# Patient Record
Sex: Female | Born: 1945 | Race: Black or African American | Hispanic: No | Marital: Single | Smoking: Former smoker
Health system: Southern US, Community
[De-identification: ages and names within clinical notes are randomized; demographics above are authoritative.]

## PROBLEM LIST (undated history)

## (undated) DIAGNOSIS — M1990 Unspecified osteoarthritis, unspecified site: Secondary | ICD-10-CM

## (undated) DIAGNOSIS — E119 Type 2 diabetes mellitus without complications: Secondary | ICD-10-CM

## (undated) DIAGNOSIS — J309 Allergic rhinitis, unspecified: Secondary | ICD-10-CM

## (undated) DIAGNOSIS — I10 Essential (primary) hypertension: Secondary | ICD-10-CM

## (undated) DIAGNOSIS — F32A Depression, unspecified: Secondary | ICD-10-CM

## (undated) DIAGNOSIS — E7800 Pure hypercholesterolemia, unspecified: Secondary | ICD-10-CM

## (undated) DIAGNOSIS — I509 Heart failure, unspecified: Secondary | ICD-10-CM

## (undated) DIAGNOSIS — D490 Neoplasm of unspecified behavior of digestive system: Secondary | ICD-10-CM

## (undated) DIAGNOSIS — M5417 Radiculopathy, lumbosacral region: Secondary | ICD-10-CM

## (undated) DIAGNOSIS — F329 Major depressive disorder, single episode, unspecified: Secondary | ICD-10-CM

## (undated) DIAGNOSIS — C801 Malignant (primary) neoplasm, unspecified: Secondary | ICD-10-CM

## (undated) HISTORY — DX: Neoplasm of unspecified behavior of digestive system: D49.0

## (undated) HISTORY — PX: TUBAL LIGATION: SHX77

## (undated) HISTORY — DX: Pure hypercholesterolemia, unspecified: E78.00

## (undated) HISTORY — DX: Major depressive disorder, single episode, unspecified: F32.9

## (undated) HISTORY — PX: CHOLECYSTECTOMY: SHX55

## (undated) HISTORY — DX: Type 2 diabetes mellitus without complications: E11.9

## (undated) HISTORY — DX: Unspecified osteoarthritis, unspecified site: M19.90

## (undated) HISTORY — DX: Radiculopathy, lumbosacral region: M54.17

## (undated) HISTORY — DX: Depression, unspecified: F32.A

## (undated) HISTORY — PX: REPLACEMENT TOTAL KNEE BILATERAL: SUR1225

## (undated) HISTORY — DX: Allergic rhinitis, unspecified: J30.9

## (undated) HISTORY — DX: Malignant (primary) neoplasm, unspecified: C80.1

---

## 2003-02-04 HISTORY — PX: BARIATRIC SURGERY: SHX1103

## 2004-09-21 ENCOUNTER — Ambulatory Visit: Payer: Self-pay | Admitting: Family Medicine

## 2006-03-07 ENCOUNTER — Ambulatory Visit: Payer: Self-pay | Admitting: Family Medicine

## 2006-04-17 ENCOUNTER — Ambulatory Visit: Payer: Self-pay | Admitting: Gastroenterology

## 2007-12-13 ENCOUNTER — Emergency Department: Payer: Self-pay | Admitting: Emergency Medicine

## 2008-06-06 ENCOUNTER — Ambulatory Visit: Payer: Self-pay | Admitting: Family Medicine

## 2008-11-06 ENCOUNTER — Ambulatory Visit: Payer: Self-pay | Admitting: Family Medicine

## 2009-06-01 ENCOUNTER — Ambulatory Visit: Payer: Self-pay | Admitting: General Practice

## 2009-06-24 ENCOUNTER — Inpatient Hospital Stay: Payer: Self-pay | Admitting: General Practice

## 2009-06-30 ENCOUNTER — Encounter: Payer: Self-pay | Admitting: Internal Medicine

## 2009-07-06 ENCOUNTER — Encounter: Payer: Self-pay | Admitting: Internal Medicine

## 2009-11-09 DIAGNOSIS — F329 Major depressive disorder, single episode, unspecified: Secondary | ICD-10-CM | POA: Insufficient documentation

## 2009-11-13 ENCOUNTER — Ambulatory Visit: Payer: Self-pay | Admitting: Family Medicine

## 2009-12-22 ENCOUNTER — Ambulatory Visit: Payer: Self-pay | Admitting: General Practice

## 2010-01-06 ENCOUNTER — Inpatient Hospital Stay: Payer: Self-pay | Admitting: General Practice

## 2010-05-21 ENCOUNTER — Ambulatory Visit: Payer: Self-pay | Admitting: Family Medicine

## 2011-06-07 ENCOUNTER — Ambulatory Visit: Payer: Self-pay

## 2011-06-09 LAB — HM MAMMOGRAPHY: HM Mammogram: NORMAL

## 2011-06-09 LAB — HM PAP SMEAR: HM Pap smear: NORMAL

## 2012-06-07 ENCOUNTER — Ambulatory Visit: Payer: Self-pay | Admitting: Family Medicine

## 2012-10-01 ENCOUNTER — Ambulatory Visit: Payer: Self-pay | Admitting: Family Medicine

## 2013-07-16 ENCOUNTER — Ambulatory Visit: Payer: Self-pay | Admitting: Family Medicine

## 2013-10-03 ENCOUNTER — Ambulatory Visit: Payer: Self-pay | Admitting: Family Medicine

## 2013-10-06 ENCOUNTER — Ambulatory Visit: Payer: Self-pay | Admitting: Family Medicine

## 2013-12-16 ENCOUNTER — Ambulatory Visit: Payer: Self-pay | Admitting: Gastroenterology

## 2014-01-06 LAB — HM COLONOSCOPY

## 2014-01-09 DIAGNOSIS — J302 Other seasonal allergic rhinitis: Secondary | ICD-10-CM | POA: Insufficient documentation

## 2014-08-27 ENCOUNTER — Emergency Department: Payer: Self-pay | Admitting: Emergency Medicine

## 2014-09-09 DIAGNOSIS — I878 Other specified disorders of veins: Secondary | ICD-10-CM | POA: Diagnosis not present

## 2014-09-09 DIAGNOSIS — M25511 Pain in right shoulder: Secondary | ICD-10-CM | POA: Diagnosis not present

## 2014-09-09 DIAGNOSIS — G8929 Other chronic pain: Secondary | ICD-10-CM | POA: Diagnosis not present

## 2014-09-09 DIAGNOSIS — M5090 Cervical disc disorder, unspecified, unspecified cervical region: Secondary | ICD-10-CM | POA: Diagnosis not present

## 2014-09-18 DIAGNOSIS — M65811 Other synovitis and tenosynovitis, right shoulder: Secondary | ICD-10-CM | POA: Insufficient documentation

## 2014-09-18 DIAGNOSIS — M7541 Impingement syndrome of right shoulder: Secondary | ICD-10-CM | POA: Insufficient documentation

## 2014-09-18 DIAGNOSIS — M65919 Unspecified synovitis and tenosynovitis, unspecified shoulder: Secondary | ICD-10-CM | POA: Insufficient documentation

## 2014-09-18 DIAGNOSIS — M65819 Other synovitis and tenosynovitis, unspecified shoulder: Secondary | ICD-10-CM | POA: Insufficient documentation

## 2014-09-30 DIAGNOSIS — H6693 Otitis media, unspecified, bilateral: Secondary | ICD-10-CM | POA: Diagnosis not present

## 2014-09-30 DIAGNOSIS — R062 Wheezing: Secondary | ICD-10-CM | POA: Diagnosis not present

## 2014-09-30 DIAGNOSIS — B9689 Other specified bacterial agents as the cause of diseases classified elsewhere: Secondary | ICD-10-CM | POA: Diagnosis not present

## 2014-09-30 DIAGNOSIS — J019 Acute sinusitis, unspecified: Secondary | ICD-10-CM | POA: Diagnosis not present

## 2014-10-06 DIAGNOSIS — G629 Polyneuropathy, unspecified: Secondary | ICD-10-CM | POA: Diagnosis not present

## 2014-10-06 DIAGNOSIS — E669 Obesity, unspecified: Secondary | ICD-10-CM | POA: Diagnosis not present

## 2014-10-06 DIAGNOSIS — E119 Type 2 diabetes mellitus without complications: Secondary | ICD-10-CM | POA: Diagnosis not present

## 2014-10-06 DIAGNOSIS — I10 Essential (primary) hypertension: Secondary | ICD-10-CM | POA: Diagnosis not present

## 2014-10-06 DIAGNOSIS — E785 Hyperlipidemia, unspecified: Secondary | ICD-10-CM | POA: Diagnosis not present

## 2014-10-14 DIAGNOSIS — I10 Essential (primary) hypertension: Secondary | ICD-10-CM | POA: Diagnosis not present

## 2014-10-14 DIAGNOSIS — E119 Type 2 diabetes mellitus without complications: Secondary | ICD-10-CM | POA: Diagnosis not present

## 2014-10-20 DIAGNOSIS — E119 Type 2 diabetes mellitus without complications: Secondary | ICD-10-CM | POA: Diagnosis not present

## 2014-10-20 DIAGNOSIS — E669 Obesity, unspecified: Secondary | ICD-10-CM | POA: Diagnosis not present

## 2014-10-20 DIAGNOSIS — I10 Essential (primary) hypertension: Secondary | ICD-10-CM | POA: Diagnosis not present

## 2014-10-20 DIAGNOSIS — E785 Hyperlipidemia, unspecified: Secondary | ICD-10-CM | POA: Diagnosis not present

## 2014-10-20 DIAGNOSIS — G629 Polyneuropathy, unspecified: Secondary | ICD-10-CM | POA: Diagnosis not present

## 2014-10-22 DIAGNOSIS — F329 Major depressive disorder, single episode, unspecified: Secondary | ICD-10-CM | POA: Diagnosis not present

## 2014-11-06 ENCOUNTER — Ambulatory Visit: Payer: Self-pay | Admitting: Family Medicine

## 2014-11-06 DIAGNOSIS — Z780 Asymptomatic menopausal state: Secondary | ICD-10-CM | POA: Diagnosis not present

## 2014-11-06 DIAGNOSIS — Z803 Family history of malignant neoplasm of breast: Secondary | ICD-10-CM | POA: Diagnosis not present

## 2014-11-06 DIAGNOSIS — N649 Disorder of breast, unspecified: Secondary | ICD-10-CM | POA: Diagnosis not present

## 2014-11-06 DIAGNOSIS — N644 Mastodynia: Secondary | ICD-10-CM | POA: Diagnosis not present

## 2014-11-06 DIAGNOSIS — Z13820 Encounter for screening for osteoporosis: Secondary | ICD-10-CM | POA: Diagnosis not present

## 2014-11-12 DIAGNOSIS — M7541 Impingement syndrome of right shoulder: Secondary | ICD-10-CM | POA: Diagnosis not present

## 2014-11-12 DIAGNOSIS — M65811 Other synovitis and tenosynovitis, right shoulder: Secondary | ICD-10-CM | POA: Diagnosis not present

## 2014-12-08 DIAGNOSIS — E669 Obesity, unspecified: Secondary | ICD-10-CM | POA: Insufficient documentation

## 2014-12-08 DIAGNOSIS — I10 Essential (primary) hypertension: Secondary | ICD-10-CM | POA: Insufficient documentation

## 2014-12-08 DIAGNOSIS — E785 Hyperlipidemia, unspecified: Secondary | ICD-10-CM | POA: Insufficient documentation

## 2014-12-08 DIAGNOSIS — E1140 Type 2 diabetes mellitus with diabetic neuropathy, unspecified: Secondary | ICD-10-CM | POA: Insufficient documentation

## 2015-01-30 DIAGNOSIS — H5203 Hypermetropia, bilateral: Secondary | ICD-10-CM | POA: Diagnosis not present

## 2015-01-30 DIAGNOSIS — H43813 Vitreous degeneration, bilateral: Secondary | ICD-10-CM | POA: Diagnosis not present

## 2015-01-30 DIAGNOSIS — H11442 Conjunctival cysts, left eye: Secondary | ICD-10-CM | POA: Diagnosis not present

## 2015-01-30 DIAGNOSIS — H25813 Combined forms of age-related cataract, bilateral: Secondary | ICD-10-CM | POA: Diagnosis not present

## 2015-01-30 DIAGNOSIS — H52223 Regular astigmatism, bilateral: Secondary | ICD-10-CM | POA: Diagnosis not present

## 2015-01-30 DIAGNOSIS — I10 Essential (primary) hypertension: Secondary | ICD-10-CM | POA: Diagnosis not present

## 2015-02-05 ENCOUNTER — Encounter: Payer: Self-pay | Admitting: Family Medicine

## 2015-02-05 ENCOUNTER — Ambulatory Visit (INDEPENDENT_AMBULATORY_CARE_PROVIDER_SITE_OTHER): Payer: Commercial Managed Care - HMO | Admitting: Family Medicine

## 2015-02-05 VITALS — BP 122/72 | HR 91 | Temp 97.6°F | Resp 16 | Ht 67.0 in | Wt 263.2 lb

## 2015-02-05 DIAGNOSIS — E668 Other obesity: Secondary | ICD-10-CM | POA: Insufficient documentation

## 2015-02-05 DIAGNOSIS — R946 Abnormal results of thyroid function studies: Secondary | ICD-10-CM | POA: Insufficient documentation

## 2015-02-05 DIAGNOSIS — E1169 Type 2 diabetes mellitus with other specified complication: Secondary | ICD-10-CM

## 2015-02-05 DIAGNOSIS — I10 Essential (primary) hypertension: Secondary | ICD-10-CM

## 2015-02-05 DIAGNOSIS — M5090 Cervical disc disorder, unspecified, unspecified cervical region: Secondary | ICD-10-CM | POA: Insufficient documentation

## 2015-02-05 DIAGNOSIS — G629 Polyneuropathy, unspecified: Secondary | ICD-10-CM | POA: Insufficient documentation

## 2015-02-05 DIAGNOSIS — M5136 Other intervertebral disc degeneration, lumbar region: Secondary | ICD-10-CM | POA: Insufficient documentation

## 2015-02-05 DIAGNOSIS — E669 Obesity, unspecified: Secondary | ICD-10-CM

## 2015-02-05 DIAGNOSIS — E785 Hyperlipidemia, unspecified: Secondary | ICD-10-CM | POA: Diagnosis not present

## 2015-02-05 DIAGNOSIS — Z1239 Encounter for other screening for malignant neoplasm of breast: Secondary | ICD-10-CM | POA: Insufficient documentation

## 2015-02-05 DIAGNOSIS — M51369 Other intervertebral disc degeneration, lumbar region without mention of lumbar back pain or lower extremity pain: Secondary | ICD-10-CM | POA: Insufficient documentation

## 2015-02-05 DIAGNOSIS — I878 Other specified disorders of veins: Secondary | ICD-10-CM | POA: Insufficient documentation

## 2015-02-05 DIAGNOSIS — R42 Dizziness and giddiness: Secondary | ICD-10-CM | POA: Insufficient documentation

## 2015-02-05 DIAGNOSIS — E1149 Type 2 diabetes mellitus with other diabetic neurological complication: Secondary | ICD-10-CM | POA: Diagnosis not present

## 2015-02-05 DIAGNOSIS — M25519 Pain in unspecified shoulder: Secondary | ICD-10-CM | POA: Insufficient documentation

## 2015-02-05 LAB — POCT GLYCOSYLATED HEMOGLOBIN (HGB A1C): HEMOGLOBIN A1C: 7.6

## 2015-02-05 LAB — GLUCOSE, POCT (MANUAL RESULT ENTRY): POC Glucose: 141 mg/dl — AB (ref 70–99)

## 2015-02-05 MED ORDER — MELOXICAM 15 MG PO TABS: 15.0000 mg | ORAL_TABLET | Freq: Every day | ORAL | Status: DC

## 2015-02-05 MED ORDER — ATORVASTATIN CALCIUM 20 MG PO TABS: 20.0000 mg | ORAL_TABLET | Freq: Every day | ORAL | Status: DC

## 2015-02-05 MED ORDER — LISINOPRIL-HYDROCHLOROTHIAZIDE 20-12.5 MG PO TABS: 1.0000 | ORAL_TABLET | Freq: Every day | ORAL | Status: DC

## 2015-02-05 NOTE — Progress Notes (Signed)
Subjective:     Patient ID: Mackenzie Park, female   DOB: 09/02/1946, 69 y.o.   MRN: [id number]  Diabetes She has type 2 diabetes mellitus. Pertinent negatives for hypoglycemia include no nervousness/anxiousness. Associated symptoms include fatigue. Pertinent negatives for diabetes include no blurred vision and no chest pain. Pertinent negatives for diabetic complications include no autonomic neuropathy, heart disease or PVD. Current diabetic treatment includes oral agent (dual therapy). Her home blood glucose trend is increasing steadily.  Hypertension This is a chronic problem. The current episode started more than 1 year ago. The problem is unchanged. The problem is controlled. Associated symptoms include anxiety and peripheral edema. Pertinent negatives include no blurred vision or chest pain. Past treatments include ACE inhibitors, diuretics and lifestyle changes. The current treatment provides moderate improvement. There are no compliance problems.  There is no history of PVD.  Hyperlipidemia This is a chronic problem. The current episode started more than 1 year ago. The problem is controlled. Recent lipid tests were reviewed and are normal. Exacerbating diseases include diabetes and obesity. Factors aggravating her hyperlipidemia include fatty foods. Associated symptoms include myalgias. Pertinent negatives include no chest pain. Current antihyperlipidemic treatment includes statins. The current treatment provides moderate improvement of lipids. There are no compliance problems.  Risk factors for coronary artery disease include diabetes mellitus, dyslipidemia, hypertension, obesity, stress and a sedentary lifestyle.  Patient had a eye exam on May 26,2016.   Obesity Continues to struggle with exercise 2nd to arthritic pain.  ? Dietary compliance  Review of Systems  Constitutional: Positive for fatigue. Negative for fever.  Eyes: Negative for blurred vision and pain.  Respiratory: Positive for  cough ( yellow mucus). Negative for chest tightness.   Cardiovascular: Positive for leg swelling (leg pain). Negative for chest pain.  Musculoskeletal: Positive for myalgias, back pain, arthralgias and gait problem.  Skin: Negative for color change and rash.  Neurological: Positive for numbness.  Hematological: Positive for adenopathy.  Psychiatric/Behavioral: Negative for sleep disturbance and dysphoric mood. The patient is not nervous/anxious.        Objective:   Physical Exam  Constitutional: She is oriented to person, place, and time. She appears well-developed and well-nourished.  HENT:  Head: Normocephalic and atraumatic.  Eyes: Conjunctivae are normal. Pupils are equal, round, and reactive to light. No scleral icterus.  Left eye cyst  Neck: Neck supple. No thyromegaly present.  Cardiovascular: Normal rate, regular rhythm, normal heart sounds and intact distal pulses.   Pulses:      Dorsalis pedis pulses are 2+ on the right side, and 2+ on the left side.       Posterior tibial pulses are 2+ on the right side, and 2+ on the left side.  VV  Pulmonary/Chest: Effort normal and breath sounds normal.  Musculoskeletal: She exhibits edema.  Lymphadenopathy:    She has no cervical adenopathy.  Neurological: She is alert and oriented to person, place, and time. A sensory deficit is present.  Skin: Skin is warm and dry.       Assessment and Plan:  1. Essential hypertension stable - lisinopril-hydrochlorothiazide (PRINZIDE,ZESTORETIC) 20-12.5 MG per tablet; Take 1 tablet by mouth daily.  Dispense: 30 tablet; Refill: 3  2. DM (diabetes mellitus) type II controlled, neurological manifestation  - POCT HgB A1C - POCT Glucose (CBG)  3. Hyperlipidemia associated with type 2 diabetes mellitus  - atorvastatin (LIPITOR) 20 MG tablet; Take 1 tablet (20 mg total) by mouth daily.  Dispense: 30 tablet; Refill: 3  4. Obesity No sig improvement

## 2015-02-05 NOTE — Patient Instructions (Signed)
Patient to return 4 months

## 2015-02-06 ENCOUNTER — Encounter: Payer: Self-pay | Admitting: Family Medicine

## 2015-05-01 ENCOUNTER — Other Ambulatory Visit: Payer: Self-pay | Admitting: Family Medicine

## 2015-05-01 DIAGNOSIS — I10 Essential (primary) hypertension: Secondary | ICD-10-CM | POA: Diagnosis not present

## 2015-05-01 DIAGNOSIS — E119 Type 2 diabetes mellitus without complications: Secondary | ICD-10-CM | POA: Diagnosis not present

## 2015-05-01 DIAGNOSIS — H2513 Age-related nuclear cataract, bilateral: Secondary | ICD-10-CM | POA: Diagnosis not present

## 2015-05-11 ENCOUNTER — Observation Stay
Admission: EM | Admit: 2015-05-11 | Discharge: 2015-05-12 | Disposition: A | Discharge status: Home / Self Care | Payer: Commercial Managed Care - HMO | Attending: Internal Medicine | Admitting: Internal Medicine

## 2015-05-11 ENCOUNTER — Emergency Department: Payer: Commercial Managed Care - HMO

## 2015-05-11 ENCOUNTER — Other Ambulatory Visit: Payer: Self-pay

## 2015-05-11 ENCOUNTER — Encounter: Payer: Self-pay | Admitting: Emergency Medicine

## 2015-05-11 DIAGNOSIS — F329 Major depressive disorder, single episode, unspecified: Secondary | ICD-10-CM | POA: Insufficient documentation

## 2015-05-11 DIAGNOSIS — J309 Allergic rhinitis, unspecified: Secondary | ICD-10-CM | POA: Insufficient documentation

## 2015-05-11 DIAGNOSIS — Z7982 Long term (current) use of aspirin: Secondary | ICD-10-CM | POA: Insufficient documentation

## 2015-05-11 DIAGNOSIS — R202 Paresthesia of skin: Secondary | ICD-10-CM | POA: Insufficient documentation

## 2015-05-11 DIAGNOSIS — Z79899 Other long term (current) drug therapy: Secondary | ICD-10-CM | POA: Insufficient documentation

## 2015-05-11 DIAGNOSIS — E785 Hyperlipidemia, unspecified: Secondary | ICD-10-CM | POA: Insufficient documentation

## 2015-05-11 DIAGNOSIS — R51 Headache: Secondary | ICD-10-CM | POA: Diagnosis not present

## 2015-05-11 DIAGNOSIS — E876 Hypokalemia: Secondary | ICD-10-CM | POA: Diagnosis not present

## 2015-05-11 DIAGNOSIS — Z96653 Presence of artificial knee joint, bilateral: Secondary | ICD-10-CM | POA: Insufficient documentation

## 2015-05-11 DIAGNOSIS — M5417 Radiculopathy, lumbosacral region: Secondary | ICD-10-CM | POA: Insufficient documentation

## 2015-05-11 DIAGNOSIS — E119 Type 2 diabetes mellitus without complications: Secondary | ICD-10-CM | POA: Insufficient documentation

## 2015-05-11 DIAGNOSIS — E041 Nontoxic single thyroid nodule: Secondary | ICD-10-CM | POA: Diagnosis not present

## 2015-05-11 DIAGNOSIS — R270 Ataxia, unspecified: Principal | ICD-10-CM

## 2015-05-11 DIAGNOSIS — I639 Cerebral infarction, unspecified: Secondary | ICD-10-CM

## 2015-05-11 DIAGNOSIS — Z791 Long term (current) use of non-steroidal anti-inflammatories (NSAID): Secondary | ICD-10-CM | POA: Insufficient documentation

## 2015-05-11 DIAGNOSIS — M1990 Unspecified osteoarthritis, unspecified site: Secondary | ICD-10-CM | POA: Insufficient documentation

## 2015-05-11 DIAGNOSIS — Z87891 Personal history of nicotine dependence: Secondary | ICD-10-CM | POA: Insufficient documentation

## 2015-05-11 DIAGNOSIS — R0789 Other chest pain: Secondary | ICD-10-CM | POA: Diagnosis not present

## 2015-05-11 DIAGNOSIS — I700 Atherosclerosis of aorta: Secondary | ICD-10-CM | POA: Insufficient documentation

## 2015-05-11 DIAGNOSIS — I10 Essential (primary) hypertension: Secondary | ICD-10-CM | POA: Insufficient documentation

## 2015-05-11 DIAGNOSIS — R42 Dizziness and giddiness: Secondary | ICD-10-CM | POA: Diagnosis not present

## 2015-05-11 LAB — LIPID PANEL
Cholesterol: 137 mg/dL (ref 0–200)
HDL: 49 mg/dL (ref >40)
LDL CALC: 80 mg/dL (ref 0–99)
Total CHOL/HDL Ratio: 2.8 RATIO
Triglycerides: 39 mg/dL (ref <150)
VLDL: 8 mg/dL (ref 0–40)

## 2015-05-11 LAB — BASIC METABOLIC PANEL
ANION GAP: 8 (ref 5–15)
BUN: 14 mg/dL (ref 6–20)
CO2: 29 mmol/L (ref 22–32)
Calcium: 9.3 mg/dL (ref 8.9–10.3)
Chloride: 102 mmol/L (ref 101–111)
Creatinine, Ser: 0.74 mg/dL (ref 0.44–1.00)
GFR calc Af Amer: >60 mL/min (ref >60)
Glucose, Bld: 175 mg/dL — ABNORMAL HIGH (ref 65–99)
POTASSIUM: 3.3 mmol/L — AB (ref 3.5–5.1)
SODIUM: 139 mmol/L (ref 135–145)

## 2015-05-11 LAB — URINALYSIS COMPLETE WITH MICROSCOPIC (ARMC ONLY)
BILIRUBIN URINE: NEGATIVE
Glucose, UA: NEGATIVE mg/dL
HGB URINE DIPSTICK: NEGATIVE
KETONES UR: NEGATIVE mg/dL
LEUKOCYTES UA: NEGATIVE
NITRITE: NEGATIVE
PH: 5 (ref 5.0–8.0)
Protein, ur: NEGATIVE mg/dL
RBC / HPF: NONE SEEN RBC/hpf (ref 0–5)
SQUAMOUS EPITHELIAL / LPF: NONE SEEN
Specific Gravity, Urine: 1.009 (ref 1.005–1.030)

## 2015-05-11 LAB — CBC WITH DIFFERENTIAL/PLATELET
BASOS ABS: 0 10*3/uL (ref 0–0.1)
Basophils Relative: 0 %
EOS ABS: 0.1 10*3/uL (ref 0–0.7)
EOS PCT: 2 %
HCT: 38.7 % (ref 35.0–47.0)
Hemoglobin: 12.5 g/dL (ref 12.0–16.0)
LYMPHS PCT: 16 %
Lymphs Abs: 1.2 10*3/uL (ref 1.0–3.6)
MCH: 26.5 pg (ref 26.0–34.0)
MCHC: 32.2 g/dL (ref 32.0–36.0)
MCV: 82.3 fL (ref 80.0–100.0)
Monocytes Absolute: 0.5 10*3/uL (ref 0.2–0.9)
Monocytes Relative: 6 %
Neutro Abs: 6 10*3/uL (ref 1.4–6.5)
Neutrophils Relative %: 76 %
PLATELETS: 226 10*3/uL (ref 150–440)
RBC: 4.7 MIL/uL (ref 3.80–5.20)
RDW: 14.1 % (ref 11.5–14.5)
WBC: 7.8 10*3/uL (ref 3.6–11.0)

## 2015-05-11 LAB — GLUCOSE, CAPILLARY
Glucose-Capillary: 215 mg/dL — ABNORMAL HIGH (ref 65–99)
Glucose-Capillary: 85 mg/dL (ref 65–99)

## 2015-05-11 LAB — TROPONIN I

## 2015-05-11 MED ORDER — GABAPENTIN 300 MG PO CAPS
300.0000 mg | ORAL_CAPSULE | Freq: Three times a day (TID) | ORAL | Status: DC
  Administered 2015-05-11 – 2015-05-12 (×3): 300 mg via ORAL
  Filled 2015-05-11 (×3): qty 1

## 2015-05-11 MED ORDER — HYDROCHLOROTHIAZIDE 12.5 MG PO CAPS: 12.5000 mg | ORAL_CAPSULE | Freq: Every day | ORAL | Status: DC

## 2015-05-11 MED ORDER — MECLIZINE HCL 12.5 MG PO TABS
25.0000 mg | ORAL_TABLET | Freq: Three times a day (TID) | ORAL | Status: DC
  Administered 2015-05-11 – 2015-05-12 (×3): 25 mg via ORAL

## 2015-05-11 MED ORDER — SENNOSIDES-DOCUSATE SODIUM 8.6-50 MG PO TABS
1.0000 | ORAL_TABLET | Freq: Every evening | ORAL | Status: DC | PRN
Start: 2015-05-11 — End: 2015-05-12

## 2015-05-11 MED ORDER — LISINOPRIL-HYDROCHLOROTHIAZIDE 20-12.5 MG PO TABS: 1.0000 | ORAL_TABLET | Freq: Every day | ORAL | Status: DC

## 2015-05-11 MED ORDER — HYDROCODONE-ACETAMINOPHEN 5-325 MG PO TABS: 1.0000 | ORAL_TABLET | ORAL | Status: DC | PRN

## 2015-05-11 MED ORDER — LISINOPRIL 20 MG PO TABS: 20.0000 mg | ORAL_TABLET | Freq: Every day | ORAL | Status: DC

## 2015-05-11 MED ORDER — CLOPIDOGREL BISULFATE 75 MG PO TABS
75.0000 mg | ORAL_TABLET | Freq: Every day | ORAL | Status: DC
  Administered 2015-05-12: 12:00:00 75 mg via ORAL
  Filled 2015-05-11: qty 1

## 2015-05-11 MED ORDER — DOCUSATE SODIUM 100 MG PO CAPS
100.0000 mg | ORAL_CAPSULE | Freq: Two times a day (BID) | ORAL | Status: DC
  Administered 2015-05-11 (×2): 100 mg via ORAL
  Filled 2015-05-11 (×2): qty 1

## 2015-05-11 MED ORDER — ASPIRIN EC 81 MG PO TBEC
81.0000 mg | DELAYED_RELEASE_TABLET | Freq: Every day | ORAL | Status: DC
  Administered 2015-05-12: 12:00:00 81 mg via ORAL
  Filled 2015-05-11: qty 1

## 2015-05-11 MED ORDER — ACETAMINOPHEN 325 MG PO TABS
650.0000 mg | ORAL_TABLET | Freq: Four times a day (QID) | ORAL | Status: DC | PRN
  Administered 2015-05-12: 650 mg via ORAL
  Filled 2015-05-11: qty 2

## 2015-05-11 MED ORDER — ONDANSETRON HCL 4 MG/2ML IJ SOLN
4.0000 mg | Freq: Once | INTRAMUSCULAR | Status: AC
  Administered 2015-05-11: 4 mg via INTRAVENOUS
  Filled 2015-05-11: qty 2

## 2015-05-11 MED ORDER — ATORVASTATIN CALCIUM 20 MG PO TABS
20.0000 mg | ORAL_TABLET | Freq: Every day | ORAL | Status: DC
  Administered 2015-05-11: 20 mg via ORAL
  Filled 2015-05-11: qty 1

## 2015-05-11 MED ORDER — ONDANSETRON HCL 4 MG PO TABS: 4.0000 mg | ORAL_TABLET | Freq: Four times a day (QID) | ORAL | Status: DC | PRN

## 2015-05-11 MED ORDER — ONDANSETRON HCL 4 MG/2ML IJ SOLN: 4.0000 mg | Freq: Four times a day (QID) | INTRAMUSCULAR | Status: DC | PRN

## 2015-05-11 MED ORDER — ALUM & MAG HYDROXIDE-SIMETH 200-200-20 MG/5ML PO SUSP: 30.0000 mL | Freq: Four times a day (QID) | ORAL | Status: DC | PRN

## 2015-05-11 MED ORDER — ASPIRIN 81 MG PO TABS: 81.0000 mg | ORAL_TABLET | Freq: Every day | ORAL | Status: DC

## 2015-05-11 MED ORDER — MECLIZINE HCL 25 MG PO TABS
25.0000 mg | ORAL_TABLET | Freq: Once | ORAL | Status: AC
  Administered 2015-05-11: 25 mg via ORAL
  Filled 2015-05-11: qty 1

## 2015-05-11 MED ORDER — ATORVASTATIN CALCIUM 20 MG PO TABS: 20.0000 mg | ORAL_TABLET | Freq: Every day | ORAL | Status: DC

## 2015-05-11 MED ORDER — SODIUM CHLORIDE 0.9 % IJ SOLN
3.0000 mL | Freq: Two times a day (BID) | INTRAMUSCULAR | Status: DC
  Administered 2015-05-11: 3 mL via INTRAVENOUS

## 2015-05-11 MED ORDER — INSULIN ASPART 100 UNIT/ML ~~LOC~~ SOLN
0.0000 [IU] | Freq: Three times a day (TID) | SUBCUTANEOUS | Status: DC
  Administered 2015-05-11: 17:00:00 3 [IU] via SUBCUTANEOUS

## 2015-05-11 MED ORDER — SODIUM CHLORIDE 0.9 % IV SOLN
INTRAVENOUS | Status: DC
  Administered 2015-05-11: 16:00:00 via INTRAVENOUS

## 2015-05-11 MED ORDER — BISACODYL 5 MG PO TBEC: 5.0000 mg | DELAYED_RELEASE_TABLET | Freq: Every day | ORAL | Status: DC | PRN

## 2015-05-11 MED ORDER — POTASSIUM CHLORIDE 20 MEQ/15ML (10%) PO SOLN
40.0000 meq | Freq: Once | ORAL | Status: AC
  Administered 2015-05-11: 40 meq via ORAL
  Filled 2015-05-11 (×2): qty 30

## 2015-05-11 MED ORDER — MECLIZINE HCL 12.5 MG PO TABS
25.0000 mg | ORAL_TABLET | Freq: Three times a day (TID) | ORAL | Status: DC | PRN
  Filled 2015-05-11 (×3): qty 2

## 2015-05-11 MED ORDER — ALBUTEROL SULFATE (2.5 MG/3ML) 0.083% IN NEBU: 3.0000 mL | INHALATION_SOLUTION | Freq: Four times a day (QID) | RESPIRATORY_TRACT | Status: DC | PRN

## 2015-05-11 MED ORDER — ENOXAPARIN SODIUM 40 MG/0.4ML ~~LOC~~ SOLN
40.0000 mg | Freq: Two times a day (BID) | SUBCUTANEOUS | Status: DC
  Administered 2015-05-11 – 2015-05-12 (×2): 40 mg via SUBCUTANEOUS
  Filled 2015-05-11 (×2): qty 0.4

## 2015-05-11 MED ORDER — ASPIRIN 81 MG PO CHEW
324.0000 mg | CHEWABLE_TABLET | Freq: Once | ORAL | Status: AC
  Administered 2015-05-11: 243 mg via ORAL
  Filled 2015-05-11: qty 3

## 2015-05-11 MED ORDER — ACETAMINOPHEN 650 MG RE SUPP: 650.0000 mg | Freq: Four times a day (QID) | RECTAL | Status: DC | PRN

## 2015-05-11 MED ORDER — ENOXAPARIN SODIUM 40 MG/0.4ML ~~LOC~~ SOLN: 40.0000 mg | SUBCUTANEOUS | Status: DC

## 2015-05-11 MED ORDER — INSULIN ASPART 100 UNIT/ML ~~LOC~~ SOLN: 0.0000 [IU] | Freq: Every day | SUBCUTANEOUS | Status: DC

## 2015-05-11 MED ORDER — MELOXICAM 7.5 MG PO TABS
15.0000 mg | ORAL_TABLET | Freq: Every day | ORAL | Status: DC
  Administered 2015-05-12: 15 mg via ORAL
  Filled 2015-05-11: qty 2

## 2015-05-11 MED ORDER — LORATADINE 10 MG PO TABS
10.0000 mg | ORAL_TABLET | Freq: Every day | ORAL | Status: DC
  Administered 2015-05-12: 10 mg via ORAL
  Filled 2015-05-11: qty 1

## 2015-05-11 NOTE — Progress Notes (Signed)
Inpatient Diabetes Program Recommendations  AACE/ADA: New Consensus Statement on Inpatient Glycemic Control (2013)  Target Ranges:  Prepandial:   less than 140 mg/dL      Peak postprandial:   less than 180 mg/dL (1-2 hours)      Critically ill patients:  140 - 180 mg/dL   Reason for Glycemic Review: Consult/DM  Diabetes history: DM 2 Outpatient Diabetes medications: None diet controlled Current orders for Inpatient glycemic control: Novolog Sensitive + Novolog Moderate scale  Note: Patient sees Dr. Rutherford Nail for DM control. Patient saw dr. Rutherford Nail on 02/05/2015 and at that time her A1c was 7.6%. Patient was not on any medication at that time as well. Will await A1c results. Will follow patient while here.  Thanks,  [name] RN, MSN, [facility] Inpatient Diabetes Coordinator Team Pager [phone]

## 2015-05-11 NOTE — ED Notes (Signed)
Reports left side numbness onset last pm.  Hx of bells palsy so hard to determine new facial droop. Grips equal

## 2015-05-11 NOTE — ED Notes (Signed)
Nurse bill in room with patient at this time

## 2015-05-11 NOTE — H&P (Signed)
[facility] [place] NAME: Mackenzie Park    MR#:  [id number]  DATE OF BIRTH:  02/03/1946  DATE OF ADMISSION:  05/11/2015  PRIMARY CARE PHYSICIAN: Ashok Norris, MD   REQUESTING/REFERRING PHYSICIAN: Dr. Dineen Kid  CHIEF COMPLAINT:  Abnormal gait HISTORY OF PRESENT ILLNESS:  Mackenzie Park  is a 69 y.o. female with a known history of diabetes and hyperlipidemia who presents with above complaint. Over the past day patient reports headache along with ataxic gait. She says she woke up yesterday and since that time she's had wobbly and unsteady gait. She has no other neurological deficits. 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 She denies speech problems, falls or weakness. In the emergency room a CT scan of the head was performed which was negative. Upon ambulation she continues to have ataxic gait.  PAST MEDICAL HISTORY:   Past Medical History  Diagnosis Date  . Hypercholesteremia   . Diabetes mellitus   . Lumbosacral neuritis   . Osteoarthritis   . Allergic rhinitis   . Depressive disorder    Bell's palsy right side  PAST SURGICAL HISTORY:   Past Surgical History  Procedure Laterality Date  . Bariatric surgery    . Replacement total knee bilateral      SOCIAL HISTORY:   Social History  Substance Use Topics  . Smoking status: Former Smoker    Quit date: 08/20/1995  . Smokeless tobacco: No  . Alcohol Use: No    FAMILY HISTORY:   Family History  Problem Relation Age of Onset  . Cancer Mother   . Dementia Father   . COPD Sister   . Cancer Sister     DRUG ALLERGIES:   Allergies  Allergen Reactions  . Sulfa Antibiotics Anaphylaxis and Hives     REVIEW OF SYSTEMS:  CONSTITUTIONAL: No fever, fatigue + generalized weakness.  EYES: No blurred or double vision.  EARS, NOSE, AND THROAT: No tinnitus or ear pain.  RESPIRATORY: No cough, shortness of breath, wheezing or hemoptysis.  CARDIOVASCULAR: No  chest pain, orthopnea, edema.  GASTROINTESTINAL: No nausea, vomiting, diarrhea or abdominal pain.  GENITOURINARY: No dysuria, hematuria.  ENDOCRINE: No polyuria, nocturia,  HEMATOLOGY: No anemia, easy bruising or bleeding SKIN: No rash or lesion. MUSCULOSKELETAL: No joint pain or arthritis.   NEUROLOGIC: No tingling, numbness, positive weakness with ataxia.  Bells' palsy PSYCHIATRY: ++ anxiety / depression.   MEDICATIONS AT HOME:   Prior to Admission medications   Medication Sig Start Date End Date Taking? Authorizing Provider  Acetaminophen 650 MG TABS Take 1 tablet by mouth 2 (two) times daily as needed.    Historical Provider, MD  albuterol (PROVENTIL HFA;VENTOLIN HFA) 108 (90 BASE) MCG/ACT inhaler Inhale 2 puffs into the lungs every 6 (six) hours as needed for wheezing or shortness of breath.    Historical Provider, MD  aspirin 81 MG tablet Take 81 mg by mouth daily.    Historical Provider, MD  atorvastatin (LIPITOR) 20 MG tablet TAKE 1 TABLET EVERY DAY 05/01/15   Ashok Norris, MD  calcium-vitamin D (OSCAL) 250-125 MG-UNIT per tablet Take by mouth.    Historical Provider, MD  docusate sodium (COLACE) 100 MG capsule Take 100 mg by mouth 2 (two) times daily.    Historical Provider, MD  fluticasone (FLONASE) 50 MCG/ACT nasal spray Place 1 spray into the nose 2 (two) times daily. 04/21/14   Historical Provider, MD  gabapentin (NEURONTIN) 300 MG capsule Take  300 mg by mouth 3 (three) times daily.    Historical Provider, MD  lisinopril-hydrochlorothiazide (PRINZIDE,ZESTORETIC) 20-12.5 MG per tablet Take 1 tablet by mouth daily. 02/05/15   Ashok Norris, MD  meclizine (ANTIVERT) 25 MG tablet Take 25 mg by mouth 3 (three) times daily as needed for dizziness.    Historical Provider, MD  meclizine (ANTIVERT) 25 MG tablet Take 1 tablet by mouth every 8 (eight) hours. 04/21/14   Historical Provider, MD  meloxicam (MOBIC) 15 MG tablet Take 1 tablet (15 mg total) by mouth daily. 02/05/15   Ashok Norris, MD      VITAL SIGNS:  Blood pressure 126/55, pulse 61, temperature 98.1 F (36.7 C), temperature source Oral, resp. rate 18, height '5\' 6"'$  (1.676 m), weight 117.935 kg (260 lb), SpO2 100 %.  PHYSICAL EXAMINATION:  GENERAL:  69 y.o.-year-old patient lying in the bed with no acute distress.  EYES: Pupils equal, round, reactive to light and accommodation. No scleral icterus. Extraocular muscles intact.  HEENT: Head atraumatic, normocephalic. Oropharynx and nasopharynx clear.  NECK:  Supple, no jugular venous distention. No thyroid enlargement, no tenderness.  LUNGS: Normal breath sounds bilaterally, no wheezing, rales,rhonchi or crepitation. No use of accessory muscles of respiration.  CARDIOVASCULAR: S1, S2 normal. No murmurs, rubs, or gallops.  ABDOMEN: Soft, nontender, nondistended. Bowel sounds present. No organomegaly or mass.  EXTREMITIES: No pedal edema, cyanosis, or clubbing.  NEUROLOGIC: right sided bell's palsy affecting CN 5 no other neurological deficits. She has ataxia but no other cerebellar abnormalities including finger to nose and Romberg  PSYCHIATRIC: The patient is alert and oriented x 3.  SKIN: No obvious rash, lesion, or ulcer.   LABORATORY PANEL:   CBC  Recent Labs Lab 05/11/15 1100  WBC 7.8  HGB 12.5  HCT 38.7  PLT 226   ------------------------------------------------------------------------------------------------------------------  Chemistries   Recent Labs Lab 05/11/15 1100  NA 139  K 3.3*  CL 102  CO2 29  GLUCOSE 175*  BUN 14  CREATININE 0.74  CALCIUM 9.3   ------------------------------------------------------------------------------------------------------------------  Cardiac Enzymes  Recent Labs Lab 05/11/15 1100  TROPONINI <0.03   ------------------------------------------------------------------------------------------------------------------  RADIOLOGY:  Dg Chest 1 View  05/11/2015   CLINICAL DATA:  69 year old  female with left-sided numbness and chest pressure.  EXAM: CHEST  1 VIEW  COMPARISON:  No priors.  FINDINGS: Lung volumes are normal. No consolidative airspace disease. No pleural effusions. No evidence of pulmonary edema. Heart size appears borderline enlarged. Atherosclerosis in the thoracic aorta. Upper mediastinal contours are within normal limits.  IMPRESSION: 1. No radiographic evidence of acute cardiopulmonary disease. 2. Atherosclerosis.   Electronically Signed   By: Vinnie Langton M.D.   On: 05/11/2015 11:07   Ct Head Wo Contrast  05/11/2015   CLINICAL DATA:  Acute right-sided headache, left extremity numbness and dizziness for 1 day.  EXAM: CT HEAD WITHOUT CONTRAST  TECHNIQUE: Contiguous axial images were obtained from the base of the skull through the vertex without intravenous contrast.  COMPARISON:  None.  FINDINGS: Mild generalized cerebral volume loss noted.  No acute intracranial abnormalities are identified, including mass lesion or mass effect, hydrocephalus, extra-axial fluid collection, midline shift, hemorrhage, or acute infarction.  The visualized bony calvarium is unremarkable.  IMPRESSION: No evidence of acute intracranial abnormality.   Electronically Signed   By: Margarette Canada M.D.   On: 05/11/2015 10:42    EKG:  Normal sinus rhythm no ST elevation depression   THIS IS 28-YEAR-OLD FEMALE WITH A HISTORY OF hyperlipidemia  and diabetes who presents with ataxia.    1. Ataxia with dizziness and headache: Patient has no other neurological deficits or nystagmus on physical examination. I will order MRI of the brain to evaluate for stroke and as well as a neurological consultation. I will also order B12 level and physical therapy consultation. Continue meclizine when necessary. If MRI is positive patient will need to undergo carotid Doppler and echocardiogram.    2. Hypokalemia: This will be repleted.   3. Diabetes type 2 without complication: Patient does not appear to be in any  outpatient medications. Diabetes coordinator consultation is placed. Continue same scale insulin and ADA diet.   4. Essential hypertension: Continue lisinopril/HCTZ.  5. Hyperlipidemia: Continue statin and check lipid panel for a.m.         All the records are reviewed and case discussed with ED provider. Management plans discussed with the patient and she is in agreement.  CODE STATUS: FULL  TOTAL TIME TAKING CARE OF THIS PATIENT: 50 minutes.    Maizey Menendez M.D on 05/11/2015 at 1:02 PM  Between 7am to 6pm - Pager - [phone] After 6pm go to [email or web address] - password [email or web address] [facility]  Office  [phone]  CC: Primary care physician; Ashok Norris, MD

## 2015-05-11 NOTE — Progress Notes (Signed)
ANTICOAGULATION CONSULT NOTE - Initial Consult  Pharmacy Consult for enoxaparin dosing Indication: VTE prophylaxis  Allergies  Allergen Reactions  . Sulfa Antibiotics Anaphylaxis and Hives    Patient Measurements: Height: '5\' 6"'$  (167.6 cm) Weight: 260 lb (117.935 kg) IBW/kg (Calculated) : 59.3   Vital Signs: Temp: 97.7 F (36.5 C) (09/05 1422) Temp Source: Oral (09/05 1422) BP: 129/71 mmHg (09/05 1422) Pulse Rate: 57 (09/05 1422)  Labs:  Recent Labs  05/11/15 1100  HGB 12.5  HCT 38.7  PLT 226  CREATININE 0.74  TROPONINI <0.03    Estimated Creatinine Clearance: 87.9 mL/min (by C-G formula based on Cr of 0.74).   Medical History: Past Medical History  Diagnosis Date  . Hypercholesteremia   . Diabetes mellitus   . Lumbosacral neuritis   . Osteoarthritis   . Allergic rhinitis   . Depressive disorder      Assessment:  Patient being admitted for ataxia. DVT prophylaxis with enoxaparin ordered for patient at '40mg'$  q24 hours. Patients BMI is 41 and CrCl 87.9 .    Plan:  With BMI >40, patient meets hospital criteria for enoxaparin q12 hour dosing. Will change patient's prophylaxis enoxaparin to '40mg'$  every 12 hours.     Nancy Fetter, PharmD Pharmacy Resident

## 2015-05-11 NOTE — Plan of Care (Signed)
Problem: Discharge/Transitional Outcomes Goal: Barriers To Progression Addressed/Resolved  Individualization: Pt prefers to be called Mrs. Salvaggio Lives at home alone.  Hx: Hypercholesteremia, Diabetes all controlled by home medications.  Moderate fall risk. Bed alarm on, hourly rounding. Pt understands how to use call system for assistance

## 2015-05-11 NOTE — ED Provider Notes (Signed)
[facility] Emergency Department Provider Note  ____________________________________________  Time seen: Approximately 10:30 AM  I have reviewed the triage vital signs and the nursing notes.   HISTORY  Chief Complaint Numbness    HPI Mackenzie Park is a 69 y.o. female with a history of diabetes and hypertension who is presenting with ataxia as well as left sided chest pressure and tingling to her left fingers over the past day. She said that she woke with these symptoms yesterday morning. She says that she thought they may go away on the road but since it didn't seem into the emergency department today. She says that she has had also intermittent nausea but no vomiting. No dysuria or diarrhea. No abdominal pain. Her dizziness and ataxia worsen when she moves her head. She denies any ringing or roaring in her ears. Has no history of vertigo in the past.   Past Medical History  Diagnosis Date  . Hypercholesteremia   . Diabetes mellitus   . Lumbosacral neuritis   . Osteoarthritis   . Allergic rhinitis   . Depressive disorder     Patient Active Problem List   Diagnosis Date Noted  . 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 Abnormal finding on thyroid function test 02/05/2015  . Breast screening 02/05/2015  . Cervical disc disease 02/05/2015  . Pain in shoulder 02/05/2015  . DDD (degenerative disc disease), lumbar 02/05/2015  . Dizziness 02/05/2015  . Neuropathy 02/05/2015  . Extreme obesity 02/05/2015  . Stasis, venous 02/05/2015  . Obesity 12/08/2014  . Diabetic neuropathy 12/08/2014  . Hypertension 12/08/2014  . Hyperlipidemia 12/08/2014  . Allergic rhinitis, seasonal 01/09/2014  . Clinical depression 11/09/2009  . Osteoarthrosis 12/19/2008  . Diabetes mellitus, type 2 06/18/2007    Past Surgical History  Procedure Laterality Date  . Bariatric surgery    . Replacement total knee bilateral      Current Outpatient Rx  Name  Route  Sig  Dispense  Refill  .  Acetaminophen 650 MG TABS   Oral   Take 1 tablet by mouth 2 (two) times daily as needed.         Marland Kitchen albuterol (PROVENTIL HFA;VENTOLIN HFA) 108 (90 BASE) MCG/ACT inhaler   Inhalation   Inhale 2 puffs into the lungs every 6 (six) hours as needed for wheezing or shortness of breath.         Marland Kitchen aspirin 81 MG tablet   Oral   Take 81 mg by mouth daily.         Marland Kitchen atorvastatin (LIPITOR) 20 MG tablet      TAKE 1 TABLET EVERY DAY   90 tablet   3   . calcium-vitamin D (OSCAL) 250-125 MG-UNIT per tablet   Oral   Take by mouth.         . docusate sodium (COLACE) 100 MG capsule   Oral   Take 100 mg by mouth 2 (two) times daily.         . fluticasone (FLONASE) 50 MCG/ACT nasal spray   Nasal   Place 1 spray into the nose 2 (two) times daily.         Marland Kitchen gabapentin (NEURONTIN) 300 MG capsule   Oral   Take 300 mg by mouth 3 (three) times daily.         Marland Kitchen lisinopril-hydrochlorothiazide (PRINZIDE,ZESTORETIC) 20-12.5 MG per tablet   Oral   Take 1 tablet by mouth daily.   30 tablet   3   . meclizine (ANTIVERT) 25  MG tablet   Oral   Take 25 mg by mouth 3 (three) times daily as needed for dizziness.         . meclizine (ANTIVERT) 25 MG tablet   Oral   Take 1 tablet by mouth every 8 (eight) hours.         . meloxicam (MOBIC) 15 MG tablet   Oral   Take 1 tablet (15 mg total) by mouth daily.   30 tablet   3     Allergies Sulfa antibiotics  Family History  Problem Relation Age of Onset  . Cancer Mother   . Dementia Father   . COPD Sister   . Cancer Sister     Social History Social History  Substance Use Topics  . Smoking status: Former Smoker    Quit date: 08/20/1995  . Smokeless tobacco: None  . Alcohol Use: No    Review of Systems Constitutional: No fever/chills Eyes: No visual changes. ENT: No sore throat. Cardiovascular: Denies chest pain. Respiratory: Denies shortness of breath. Gastrointestinal: No abdominal pain. no vomiting.  No diarrhea.   No constipation. Genitourinary: Negative for dysuria. Musculoskeletal: Negative for back pain. Skin: Negative for rash. Neurological: Negative for headaches, focal weakness or numbness.  10-point ROS otherwise negative.  ____________________________________________   PHYSICAL EXAM:  VITAL SIGNS: ED Triage Vitals  Enc Vitals Group     BP 05/11/15 1012 126/55 mmHg     Pulse Rate 05/11/15 1012 61     Resp 05/11/15 1012 18     Temp 05/11/15 1012 98.1 F (36.7 C)     Temp Source 05/11/15 1012 Oral     SpO2 05/11/15 1012 98 %     Weight 05/11/15 1012 260 lb (117.935 kg)     Height 05/11/15 1012 '5\' 6"'$  (1.676 m)     Head Cir --      Peak Flow --      Pain Score 05/11/15 1010 5     Pain Loc --      Pain Edu? --      Excl. in [place]? --     Constitutional: Alert and oriented. in no acute distress. Eyes: Conjunctivae are normal. PERRL. EOMI. Head: Atraumatic. TMs normal bilaterally. Nose: No congestion/rhinnorhea. Mouth/Throat: Mucous membranes are moist.  Oropharynx non-erythematous. Neck: No stridor.   Cardiovascular: Normal rate, regular rhythm. Grossly normal heart sounds.  Good peripheral circulation. Respiratory: Normal respiratory effort.  No retractions. Lungs CTAB. Gastrointestinal: Soft and nontender. No distention. No abdominal bruits. No CVA tenderness. Musculoskeletal: No lower extremity tenderness nor edema.  No joint effusions. Neurologic:  Normal speech and language. Ataxic on finger to nose with her left hand. Needs assistance when transferring from the wheelchair to the bed because of an unsteady gait. Skin:  Skin is warm, dry and intact. No rash noted. Psychiatric: Mood and affect are normal. Speech and behavior are normal.  NIH Stroke Scale  Person Administering Scale: Doran Stabler  Administer stroke scale items in the order listed. Record performance in each category after each subscale exam. Do not go back and change scores. Follow directions provided  for each exam technique. Scores should reflect what the patient does, not what the clinician thinks the patient can do. The clinician should record answers while administering the exam and work quickly. Except where indicated, the patient should not be coached (i.e., repeated requests to patient to make a special effort).   1a  Level of consciousness: 0=alert; keenly responsive  1b. LOC  questions:  0=Performs both tasks correctly  1c. LOC commands: 0=Performs both tasks correctly  2.  Best Gaze: 0=normal  3.  Visual: 0=No visual loss  4. Facial Palsy: 0=Normal symmetric movement  5a.  Motor left arm: 0=No drift, limb holds 90 (or 45) degrees for full 10 seconds  5b.  Motor right arm: 0=No drift, limb holds 90 (or 45) degrees for full 10 seconds  6a. motor left leg: 0=No drift, limb holds 90 (or 45) degrees for full 10 seconds  6b  Motor right leg:  0=No drift, limb holds 90 (or 45) degrees for full 10 seconds  7. Limb Ataxia: 1=Present in one limb  8.  Sensory: 0=Normal; no sensory loss  9. Best Language:  0=No aphasia, normal  10. Dysarthria: 0=Normal  11. Extinction and Inattention: 0=No abnormality  12. Distal motor function: 0=Normal   Total:   1   ____________________________________________   LABS (all labs ordered are listed, but only abnormal results are displayed)  Labs Reviewed  BASIC METABOLIC PANEL - Abnormal; Notable for the following:    Potassium 3.3 (*)    Glucose, Bld 175 (*)    All other components within normal limits  CBC WITH DIFFERENTIAL/PLATELET  TROPONIN I  URINALYSIS COMPLETEWITH MICROSCOPIC ([facility] ONLY)   ____________________________________________  EKG  ED ECG REPORT I, Doran Stabler, the attending physician, personally viewed and interpreted this ECG.   Date: 05/11/2015  EKG Time: 1044  Rate: 61  Rhythm: normal sinus rhythm  Axis: Normal axis  Intervals:none  ST&T Change: No ST segment elevation or depression. No abnormal T-wave  inversion.  ____________________________________________  RADIOLOGY  No evidence of acute intracranial abnormality on her CAT scan of the brain.  Chest x-ray without radiographic evidence of cardiopulmonary disease. I personally reviewed these films. ____________________________________________   PROCEDURES   ____________________________________________   INITIAL IMPRESSION / ASSESSMENT AND PLAN / ED COURSE  Pertinent labs & imaging results that were available during my care of the patient were reviewed by me and considered in my medical decision making (see chart for details).  ----------------------------------------- 1:03 PM on 05/11/2015 -----------------------------------------  Patient reassessed and still with dizziness on ambulation with ataxic gait. Also with improved but still with mild ataxia to the left upper extremity on finger to nose. We'll admit to the hospital. Signed out to Dr. Genia Harold. Suspect possible CVA. ____________________________________________   FINAL CLINICAL IMPRESSION(S) / ED DIAGNOSES  Acute ataxia. Initial visit.    Orbie Pyo, MD 05/11/15 [phone]

## 2015-05-12 ENCOUNTER — Observation Stay: Payer: Commercial Managed Care - HMO

## 2015-05-12 ENCOUNTER — Observation Stay
Admit: 2015-05-12 | Discharge: 2015-05-12 | Disposition: A | Discharge status: Home / Self Care | Payer: Commercial Managed Care - HMO | Attending: Internal Medicine | Admitting: Internal Medicine

## 2015-05-12 DIAGNOSIS — F329 Major depressive disorder, single episode, unspecified: Secondary | ICD-10-CM | POA: Diagnosis not present

## 2015-05-12 DIAGNOSIS — E119 Type 2 diabetes mellitus without complications: Secondary | ICD-10-CM | POA: Diagnosis not present

## 2015-05-12 DIAGNOSIS — E876 Hypokalemia: Secondary | ICD-10-CM | POA: Diagnosis not present

## 2015-05-12 DIAGNOSIS — Z79899 Other long term (current) drug therapy: Secondary | ICD-10-CM | POA: Diagnosis not present

## 2015-05-12 DIAGNOSIS — R51 Headache: Secondary | ICD-10-CM | POA: Diagnosis not present

## 2015-05-12 DIAGNOSIS — I6523 Occlusion and stenosis of bilateral carotid arteries: Secondary | ICD-10-CM | POA: Diagnosis not present

## 2015-05-12 DIAGNOSIS — R42 Dizziness and giddiness: Secondary | ICD-10-CM | POA: Diagnosis not present

## 2015-05-12 DIAGNOSIS — R270 Ataxia, unspecified: Secondary | ICD-10-CM | POA: Diagnosis not present

## 2015-05-12 DIAGNOSIS — G459 Transient cerebral ischemic attack, unspecified: Secondary | ICD-10-CM | POA: Diagnosis not present

## 2015-05-12 DIAGNOSIS — R531 Weakness: Secondary | ICD-10-CM | POA: Diagnosis not present

## 2015-05-12 DIAGNOSIS — M1990 Unspecified osteoarthritis, unspecified site: Secondary | ICD-10-CM | POA: Diagnosis not present

## 2015-05-12 DIAGNOSIS — E785 Hyperlipidemia, unspecified: Secondary | ICD-10-CM | POA: Diagnosis not present

## 2015-05-12 DIAGNOSIS — I10 Essential (primary) hypertension: Secondary | ICD-10-CM | POA: Diagnosis not present

## 2015-05-12 DIAGNOSIS — R0789 Other chest pain: Secondary | ICD-10-CM | POA: Diagnosis not present

## 2015-05-12 LAB — GLUCOSE, CAPILLARY
GLUCOSE-CAPILLARY: 140 mg/dL — AB (ref 65–99)
Glucose-Capillary: 94 mg/dL (ref 65–99)

## 2015-05-12 LAB — HEMOGLOBIN A1C: HEMOGLOBIN A1C: 7.5 % — AB (ref 4.0–6.0)

## 2015-05-12 LAB — VITAMIN B12: VITAMIN B 12: 232 pg/mL (ref 180–914)

## 2015-05-12 MED ORDER — LIVING WELL WITH DIABETES BOOK
Freq: Once | Status: DC
  Filled 2015-05-12: qty 1

## 2015-05-12 NOTE — Progress Notes (Signed)
*  PRELIMINARY RESULTS* Echocardiogram 2D Echocardiogram has been performed.  Laqueta Jean Hege 05/12/2015, 9:42 AM

## 2015-05-12 NOTE — Discharge Summary (Signed)
[facility] NAME: Mackenzie Park    MR#:  [id number]  DATE OF BIRTH:  Mar 18, 1946  DATE OF ADMISSION:  05/11/2015 ADMITTING PHYSICIAN: Bettey Costa, MD  DATE OF DISCHARGE: 05/12/2015  PRIMARY CARE PHYSICIAN: Ashok Norris, MD    ADMISSION DIAGNOSIS:  Ataxia [R27.0]  DISCHARGE DIAGNOSIS:  Active Problems:   Ataxia   SECONDARY DIAGNOSIS:   Past Medical History  Diagnosis Date  . Hypercholesteremia   . Diabetes mellitus   . Lumbosacral neuritis   . Osteoarthritis   . Allergic rhinitis   . Depressive disorder     HOSPITAL COURSE:  This is a very pleasant 69 year old female with a history of hyperlipidemia and diabetes diet controlled who presented with ataxia. For further details please further H&P.  1. Ataxia: She presented with ataxia and dizziness. She has not had orthostasis as well. She underwent CVA workup which was essentially was negative. Her MRI showed no acute stroke and her carotid Doppler showed no hemodynamically significant stenosis but she is seen and evaluated by neurology. Her orthostasis has improved. Her dizziness/ataxia is improved. She was seen by physical therapy who recommended home with home health.  2. Diet-controlled diabetes: Hemoglobin A1c is 7.5. Patient is very hesitant to take medications. She has appointment next month with her primary care physician. She says she will discuss with him about medication.  3. Hyperlipidemia: Patient's LDL is at goal. Patient will continue on Lipitor.  4. Essential hypertension: Continue lisinopril/HCTZ.   DISCHARGE CONDITIONS AND DIET:  Patient is being discharged home with home health care on diabetic/heart healthy diet in stable condition  CONSULTS OBTAINED:  Treatment Team:  Leotis Pain, MD  DRUG ALLERGIES:   Allergies  Allergen Reactions  . Sulfa Antibiotics Anaphylaxis and Hives    DISCHARGE MEDICATIONS:   Current Discharge Medication  List    CONTINUE these medications which have NOT CHANGED   Details  acetaminophen (TYLENOL) 650 MG CR tablet Take 650 mg by mouth at bedtime as needed for pain.    aspirin EC 81 MG tablet Take 81 mg by mouth daily.    atorvastatin (LIPITOR) 20 MG tablet Take 20 mg by mouth at bedtime.    fexofenadine (ALLEGRA) 180 MG tablet Take 180 mg by mouth daily.    gabapentin (NEURONTIN) 300 MG capsule Take 300 mg by mouth 3 (three) times daily.    lisinopril-hydrochlorothiazide (PRINZIDE,ZESTORETIC) 20-12.5 MG per tablet Take 1 tablet by mouth daily. Qty: 30 tablet, Refills: 3   Associated Diagnoses: Essential hypertension    meloxicam (MOBIC) 15 MG tablet Take 1 tablet (15 mg total) by mouth daily. Qty: 30 tablet, Refills: 3      STOP taking these medications     albuterol (PROVENTIL HFA;VENTOLIN HFA) 108 (90 BASE) MCG/ACT inhaler      docusate sodium (COLACE) 100 MG capsule      meclizine (ANTIVERT) 25 MG tablet      meclizine (ANTIVERT) 25 MG tablet               Today   CHIEF COMPLAINT:  Patient is doing well this morning. Patient denies ataxia or dizziness.   VITAL SIGNS:  Blood pressure 107/51, pulse 63, temperature 97.7 F (36.5 C), temperature source Oral, resp. rate 18, height '5\' 6"'$  (1.676 m), weight 118.502 kg (261 lb 4 oz), SpO2 94 %.   REVIEW OF SYSTEMS:  Review of Systems  Constitutional: Negative for fever, chills and malaise/fatigue.  HENT: Negative  for sore throat.   Eyes: Negative for blurred vision.  Respiratory: Negative for cough, hemoptysis, shortness of breath and wheezing.   Cardiovascular: Negative for chest pain, palpitations and leg swelling.  Gastrointestinal: Negative for nausea, vomiting, abdominal pain, diarrhea and blood in stool.  Genitourinary: Negative for dysuria.  Musculoskeletal: Negative for back pain.  Neurological: Negative for dizziness, tremors and headaches.  Endo/Heme/Allergies: Does not bruise/bleed easily.      PHYSICAL EXAMINATION:  GENERAL:  69 y.o.-year-old patient lying in the bed with no acute distress.  NECK:  Supple, no jugular venous distention. No thyroid enlargement, no tenderness.  LUNGS: Normal breath sounds bilaterally, no wheezing, rales,rhonchi  No use of accessory muscles of respiration.  CARDIOVASCULAR: S1, S2 normal. No murmurs, rubs, or gallops.  ABDOMEN: Soft, non-tender, non-distended. Bowel sounds present. No organomegaly or mass.  EXTREMITIES: No pedal edema, cyanosis, or clubbing.  PSYCHIATRIC: The patient is alert and oriented x 3.  SKIN: No obvious rash, lesion, or ulcer.   DATA REVIEW:   CBC  Recent Labs Lab 05/11/15 1100  WBC 7.8  HGB 12.5  HCT 38.7  PLT 226    Chemistries   Recent Labs Lab 05/11/15 1100  NA 139  K 3.3*  CL 102  CO2 29  GLUCOSE 175*  BUN 14  CREATININE 0.74  CALCIUM 9.3    Cardiac Enzymes  Recent Labs Lab 05/11/15 1100  TROPONINI <0.03    Microbiology Results  '@[id number]'$ @  RADIOLOGY:  Dg Chest 1 View  05/11/2015   CLINICAL DATA:  69 year old female with left-sided numbness and chest pressure.  EXAM: CHEST  1 VIEW  COMPARISON:  No priors.  FINDINGS: Lung volumes are normal. No consolidative airspace disease. No pleural effusions. No evidence of pulmonary edema. Heart size appears borderline enlarged. Atherosclerosis in the thoracic aorta. Upper mediastinal contours are within normal limits.  IMPRESSION: 1. No radiographic evidence of acute cardiopulmonary disease. 2. Atherosclerosis.   Electronically Signed   By: Vinnie Langton M.D.   On: 05/11/2015 11:07   Ct Head Wo Contrast  05/11/2015   CLINICAL DATA:  Acute right-sided headache, left extremity numbness and dizziness for 1 day.  EXAM: CT HEAD WITHOUT CONTRAST  TECHNIQUE: Contiguous axial images were obtained from the base of the skull through the vertex without intravenous contrast.  COMPARISON:  None.  FINDINGS: Mild generalized cerebral volume loss noted.  No  acute intracranial abnormalities are identified, including mass lesion or mass effect, hydrocephalus, extra-axial fluid collection, midline shift, hemorrhage, or acute infarction.  The visualized bony calvarium is unremarkable.  IMPRESSION: No evidence of acute intracranial abnormality.   Electronically Signed   By: Margarette Canada M.D.   On: 05/11/2015 10:42   Mr Brain Wo Contrast  05/12/2015   CLINICAL DATA:  69 year old female with left side headache for 4 days associated with right side weakness and right facial droop. No slurred speech. Initial encounter.  EXAM: MRI HEAD WITHOUT CONTRAST  TECHNIQUE: Multiplanar, multiecho pulse sequences of the brain and surrounding structures were obtained without intravenous contrast.  COMPARISON:  Head CT without contrast 05/11/2015.  FINDINGS: Cerebral volume is within normal limits for age. No restricted diffusion to suggest acute infarction. No midline shift, mass effect, evidence of mass lesion, ventriculomegaly, extra-axial collection or acute intracranial hemorrhage. Cervicomedullary junction and pituitary are within normal limits. Major intracranial vascular flow voids are within normal limits. Pearline Cables and white matter signal is within normal limits for age throughout the brain.  Mastoids and paranasal sinuses are clear.  Grossly negative visualized internal auditory structures. Negative for age visualized cervical spine. Negative orbit and scalp soft tissues.  IMPRESSION: Normal for age noncontrast MRI appearance of the brain.   Electronically Signed   By: Genevie Ann M.D.   On: 05/12/2015 10:50   US Carotid Bilateral  05/12/2015   CLINICAL DATA:  CVA.  EXAM: BILATERAL CAROTID DUPLEX ULTRASOUND  TECHNIQUE: Pearline Cables scale imaging, color Doppler and duplex ultrasound were performed of bilateral carotid and vertebral arteries in the neck.  COMPARISON:  CT 05/11/2015.  FINDINGS: Criteria: Quantification of carotid stenosis is based on velocity parameters that correlate the residual  internal carotid diameter with NASCET-based stenosis levels, using the diameter of the distal internal carotid lumen as the denominator for stenosis measurement.  The following velocity measurements were obtained:  RIGHT  ICA:  91/35 cm/sec  CCA:  38/88 cm/sec  SYSTOLIC ICA/CCA RATIO:  1.1  DIASTOLIC ICA/CCA RATIO:  1.3  ECA:  84 cm/sec  LEFT  ICA:  84/34 cm/sec  CCA:  75/79 cm/sec  SYSTOLIC ICA/CCA RATIO:  0.9  DIASTOLIC ICA/CCA RATIO:  1.4  ECA:  79 cm/sec  RIGHT CAROTID ARTERY: Minimal right common carotid and bifurcation atherosclerotic vascular plaque. No flow limiting stenosis.  RIGHT VERTEBRAL ARTERY:  Patent with antegrade flow.  LEFT CAROTID ARTERY: Minimal left common carotid and bifurcation atherosclerotic vascular plaque. No flow limiting stenosis.  LEFT VERTEBRAL ARTERY:  Patent antegrade flow.  Multinodular thyroid gland is noted. Two large thyroid nodules are noted in the left lobe with the largest measuring 2.5 cm. Dedicated thyroid ultrasound suggested for further evaluation.  IMPRESSION: 1. Minimal bilateral carotid bifurcation atherosclerotic vascular plaque. No flow limiting stenosis. Degree of stenosis less than 50%. 2. Vertebral arteries are patent with antegrade flow. 3. Multinodular thyroid gland with prominent nodules in the left lobe with the largest measuring 2.5 cm. Dedicated thyroid ultrasound suggested for further evaluation.   Electronically Signed   By: Marcello Moores  Register   On: 05/12/2015 11:04      Management plans discussed with the patient and she is in agreement. Stable for discharge home with [facility]  Patient should follow up with PCP in 1 week  CODE STATUS:     Code Status Orders        Start     Ordered   05/11/15 1414  Full code   Continuous     05/11/15 1413      TOTAL TIME TAKING CARE OF THIS PATIENT: 35 minutes.    Jamian Andujo M.D on 05/12/2015 at 11:45 AM  Between 7am to 6pm - Pager - [phone] After 6pm go to [email or web address] - password [email or web address]  [facility]  Office  [phone]  CC: Primary care physician; Ashok Norris, MD

## 2015-05-12 NOTE — Progress Notes (Signed)
Inpatient Diabetes Program Recommendations  AACE/ADA: New Consensus Statement on Inpatient Glycemic Control (2013)  Target Ranges:  Prepandial:   less than 140 mg/dL      Peak postprandial:   less than 180 mg/dL (1-2 hours)      Critically ill patients:  140 - 180 mg/dL   Reason for Glycemic Review: Consult/DM  Diabetes history: DM 2 Outpatient Diabetes medications: None diet controlled Current orders for Inpatient glycemic control: Novolog 0-9 units tid and 0-5 units qhs   Saw patient at the bedside-  She reports she was told to start medication by Dr. Lucita Lora on her last visit but refused to go on anything because of side effects; she tells me she'll start Weight Watchers because when she was on Weight Watchers her A1C was around 5.6%. I explained how diabetes is a progressive disease and that it may require more than diet.  She tells me her neice wants her to exercise with her and reports that she could start walking a few minutes every day. I explained that uncontrolled diabetes leads to strokes, heart attacks, skin infections if the blood sugars are not managed.   Ordered Living Well with Diabetes.   Next visit with Dr. Lucita Lora is June 11, 2015.   Gentry Fitz, RN, BA, MHA, CDE Diabetes Coordinator Inpatient Diabetes Program  [phone] (Team Pager) [phone] ([place]) 05/12/2015 12:45 PM   Tells me she's been on Metformin in the past but said it caused her to have abdominal pain.  Reports having taken Glipizide but it caused her to "feel bad" and become "shaky".  She tells me the insulin being given by the staff "hurts" when it's given in her "stomach"

## 2015-05-12 NOTE — Progress Notes (Signed)
Pt may return back to work at time of discharge 05/13/15

## 2015-05-12 NOTE — Evaluation (Signed)
Physical Therapy Evaluation Patient Details Name: Mackenzie Park MRN: [id number] DOB: December 25, 1945 Today's Date: 05/12/2015   History of Present Illness  presented to ER secondary to ataxia, L-sided chest pain, tingling in L fingers; admitted for TIA/CVA work-up.  Clinical Impression  Upon evaluation, patient alert and oriented to all information; follows all commands and demonstrates good insight/safety awareness.  Bilat UE/LE strength and ROM grossly WFL (4 to 4+/5) without focal weakness, drift noted.  No significant coordination deficits noted.  Patient does endorse mild paresthesia L thumb and first finger (new to this event).  Brief vestibular screen positive resting nystagmus with gaze towards R (may be suggestive of central cause of dizziness?); subjective reports of dizziness with visual tracking.  Additional testing deferred, as transport present for ECHO/MRI.  Orthostatics positive upon admission; negative during PT evaluation. Patient currently able to perform bed mobility indep; sit/stand, basic transfers and short-distance gait (25') with RW, cga/min assist.  Minimal complaints of dizziness with transition to upright or with functional mobility; however, distance limited to room due to pending testing.  Will plan to assess further in subsequent sessions.  Do recommend continued use of RW with all mobility at this time. Would benefit from skilled PT to address above deficits and promote optimal return to PLOF; Recommend transition to [place] upon discharge from acute hospitalization.     Follow Up Recommendations Home health PT    Equipment Recommendations       Recommendations for Other Services       Precautions / Restrictions Precautions Precautions: Fall Restrictions Weight Bearing Restrictions: No      Mobility  Bed Mobility Overal bed mobility: Independent                Transfers Overall transfer level: Needs assistance Equipment used: Rolling walker (2  wheeled) Transfers: Sit to/from Stand Sit to Stand: Min guard            Ambulation/Gait Ambulation/Gait assistance: Min guard Ambulation Distance (Feet): 25 Feet Assistive device: Rolling walker (2 wheeled)     Gait velocity interpretation: <1.8 ft/sec, indicative of risk for recurrent falls General Gait Details: broad BOS with fair step height/length; patient reporting mild instability in R SLS, but no overt buckling/LOB noted.  Stairs            Wheelchair Mobility    Modified Rankin (Stroke Patients Only)       Balance Overall balance assessment: Needs assistance Sitting-balance support: No upper extremity supported;Feet supported Sitting balance-Leahy Scale: Normal     Standing balance support: Bilateral upper extremity supported Standing balance-Leahy Scale: Fair                 High Level Balance Comments: decreased truncal stability noted with dynamic mobility             Pertinent Vitals/Pain Pain Assessment: No/denies pain    Home Living Family/patient expects to be discharged to:: Private residence Living Arrangements: Alone Available Help at Discharge: Family;Friend(s) Type of Home: House Home Access: Ramped entrance     Home Layout: One level Home Equipment: Environmental consultant - 2 wheels;Cane - single point      Prior Function Level of Independence: Independent         Comments: Indep with all household/community mobility; + driving; denies fall history.  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  Finger to nose intact bilat. No sensory deficit except L thumb and first finger (decreased light touch))           Lower Extremity Assessment: Overall  WFL for tasks assessed (strength grossly WFL and symmetrical (4 to 4+/5); no focal weakness; no drift; no significant sensory or coordination deficit.)         Communication   Communication: No difficulties  Cognition Arousal/Alertness: Awake/alert Behavior During Therapy: WFL for tasks assessed/performed Overall Cognitive Status: Within Functional Limits for tasks assessed                      General Comments      Exercises Other Exercises Other Exercises: Toilet transfer, ambulatory with RW, cga/min assist to [facility].  Sit/stand from elevated BSC, cga with RW; standing balance for hygiene and clothing management with RW, cga.  Hand hygiene at sink, close sup; functional reach approx 3-4" outside immediate BOS.  (8 min) Other Exercises: Vestibular screening: positive resting nystagmus with gaze towards R; subjective reports of dizziness with visual tracking.  Additional testing deferred, as transport present for ECHO/MRI.      Assessment/Plan    PT Assessment Patient needs continued PT services  PT Diagnosis Difficulty walking;Generalized weakness   PT Problem List Decreased strength;Decreased range of motion;Decreased balance;Decreased activity tolerance;Decreased mobility;Decreased knowledge of use of DME;Decreased safety awareness;Decreased knowledge of precautions  PT Treatment Interventions DME instruction;Gait training;Stair training;Functional mobility training;Therapeutic activities;Therapeutic exercise;Balance training;Neuromuscular re-education;Patient/family education   PT Goals (Current goals can be found in the Care Plan section) Acute Rehab PT Goals Patient Stated Goal: "to go back home today" PT Goal Formulation: With patient Time For Goal Achievement: 05/26/15 Potential to Achieve Goals: Good Additional Goals Additional Goal #1: Complete additional objective balance assessment and set goal as appropriate    Frequency 7X/week (if MRI negative, will change  frequency to 2-6x/week)   Barriers to discharge Decreased caregiver support      Co-evaluation               End of Session Equipment Utilized During Treatment: Gait belt Activity Tolerance: Patient tolerated treatment well Patient left: in bed (with transport for ECHO/MRI) Nurse Communication: Mobility status    Functional Assessment Tool Used: clinical judgement, functional reach Functional Limitation: Mobility: Walking and moving around Mobility: Walking and Moving Around Current Status (B3532): At least 20 percent but less than 40 percent impaired, limited or restricted Mobility: Walking and Moving Around Goal Status [phone]): At least 1 percent but less than 20 percent impaired, limited or restricted    Time: 0846-0904 PT Time Calculation (min) (ACUTE ONLY): 18 min   Charges:   PT Evaluation $Initial PT Evaluation Tier I: 1 Procedure PT Treatments $Therapeutic Activity: 8-22 mins   PT G Codes:   PT G-Codes **NOT FOR INPATIENT CLASS** Functional Assessment Tool Used: clinical judgement, functional reach Functional Limitation: Mobility: Walking and moving around Mobility: Walking and Moving Around Current Status (A8341): At least 20 percent but less than 40 percent impaired, limited or restricted Mobility: Walking and Moving Around Goal Status [phone]): At least 1 percent but less than 20 percent impaired, limited or restricted   Keshia Weare H. Owens Shark, PT, DPT, NCS 05/12/2015, 9:21 AM [phone]

## 2015-05-12 NOTE — Care Management (Signed)
Admitted to this facility under observation status with the diagnosis of ataxia. Lives alone. Niece is Education officer, environmental [phone]). No home Health. No skilled facility. No home oxygen. Life Alert is present. Uses no aids for ambulation. Takes care of all activities of daily living, both basic and instrumental, herself, still drives. Physical Therapy evaluation completed. Recommends home with home health/physical therapy. Mackenzie Park declines therapy and home health at this time, but did agree to ask a relative to spend the next 24hours with her in the home after discharge. Family will transport. Discharge to home today per Dr. Benjie Karvonen. Shelbie Ammons RN MSN Care Management [phone]

## 2015-05-14 ENCOUNTER — Encounter: Payer: Self-pay | Admitting: Family Medicine

## 2015-05-14 ENCOUNTER — Ambulatory Visit (INDEPENDENT_AMBULATORY_CARE_PROVIDER_SITE_OTHER): Payer: Commercial Managed Care - HMO | Admitting: Family Medicine

## 2015-05-14 VITALS — BP 124/74 | HR 83 | Temp 98.2°F | Resp 16 | Ht 66.0 in | Wt 254.1 lb

## 2015-05-14 DIAGNOSIS — E785 Hyperlipidemia, unspecified: Secondary | ICD-10-CM

## 2015-05-14 DIAGNOSIS — I10 Essential (primary) hypertension: Secondary | ICD-10-CM

## 2015-05-14 DIAGNOSIS — E1165 Type 2 diabetes mellitus with hyperglycemia: Secondary | ICD-10-CM

## 2015-05-14 DIAGNOSIS — R42 Dizziness and giddiness: Secondary | ICD-10-CM | POA: Diagnosis not present

## 2015-05-14 DIAGNOSIS — IMO0002 Reserved for concepts with insufficient information to code with codable children: Secondary | ICD-10-CM

## 2015-05-14 DIAGNOSIS — Z23 Encounter for immunization: Secondary | ICD-10-CM | POA: Diagnosis not present

## 2015-05-14 DIAGNOSIS — R270 Ataxia, unspecified: Secondary | ICD-10-CM | POA: Diagnosis not present

## 2015-05-14 DIAGNOSIS — E1140 Type 2 diabetes mellitus with diabetic neuropathy, unspecified: Secondary | ICD-10-CM | POA: Diagnosis not present

## 2015-05-14 LAB — GLUCOSE, POCT (MANUAL RESULT ENTRY): POC Glucose: 111 mg/dl — AB (ref 70–99)

## 2015-05-14 LAB — POCT GLYCOSYLATED HEMOGLOBIN (HGB A1C): Hemoglobin A1C: 7.1

## 2015-05-14 MED ORDER — MECLIZINE HCL 25 MG PO TABS: 25.0000 mg | ORAL_TABLET | Freq: Three times a day (TID) | ORAL | Status: DC | PRN

## 2015-05-14 MED ORDER — EXENATIDE ER 2 MG ~~LOC~~ PEN: 1.0000 "pen " | PEN_INJECTOR | SUBCUTANEOUS | Status: DC

## 2015-05-14 MED ORDER — GLUCOSE BLOOD VI STRP: ORAL_STRIP | Status: DC

## 2015-05-14 NOTE — Progress Notes (Signed)
Name: Mackenzie Park   MRN: [id number]    DOB: 14-Jun-1946   Date:05/14/2015       Progress Note  Subjective  Chief Complaint  Chief Complaint  Patient presents with  . Hospitalization Follow-up    HPI  Ataxia  Patient was hospitalized for 2 days [facility] for problem with ataxia and dizziness. Workup included CT of the head as well as MRI which was unremarkable. An echocardiogram was unremarkable as well. Doppler studies of the carotid were negative. Her ataxia improved and she had some physical therapy done. It was noted that her A1c was elevated at 7.5 she has refused to take diabetes medicines in the past.  Diabetes  Patient presents for follow-up of diabetes which is present for several years. Is currently on a regimen of diet and supposedly exercises alone . Patient states inconsistent with their diet and exercise. There's been no hypoglycemic episodes and there no polyuria polydipsia polyphagia. His average fasting glucoses been in the low around 111  with a high around 50s . There is no end organ disease.  Last diabetic eye exam was within the last year .   Last visit with dietitian was greater than 1 year ago. Last microalbumin was obtained 0 on 10/06/2014 .   Past Medical History  Diagnosis Date  . Hypercholesteremia   . Diabetes mellitus   . Lumbosacral neuritis   . Osteoarthritis   . Allergic rhinitis   . Depressive disorder     Social History  Substance Use Topics  . Smoking status: Former Smoker    Quit date: 08/20/1995  . Smokeless tobacco: Not on file  . Alcohol Use: No     Current outpatient prescriptions:  .  acetaminophen (TYLENOL) 650 MG CR tablet, Take 650 mg by mouth at bedtime as needed for pain., Disp: , Rfl:  .  aspirin EC 81 MG tablet, Take 81 mg by mouth daily., Disp: , Rfl:  .  atorvastatin (LIPITOR) 20 MG tablet, Take 20 mg by mouth at bedtime., Disp: , Rfl:  .  fexofenadine (ALLEGRA) 180 MG tablet, Take 180 mg by mouth  daily., Disp: , Rfl:  .  gabapentin (NEURONTIN) 300 MG capsule, Take 300 mg by mouth 3 (three) times daily., Disp: , Rfl:  .  lisinopril-hydrochlorothiazide (PRINZIDE,ZESTORETIC) 20-12.5 MG per tablet, Take 1 tablet by mouth daily., Disp: 30 tablet, Rfl: 3 .  meloxicam (MOBIC) 15 MG tablet, Take 1 tablet (15 mg total) by mouth daily., Disp: 30 tablet, Rfl: 3  Allergies  Allergen Reactions  . Sulfa Antibiotics Anaphylaxis and Hives    Review of Systems  Constitutional: Positive for weight loss. Negative for fever and chills.  HENT: Negative for congestion, hearing loss, sore throat and tinnitus.   Eyes: Negative for blurred vision, double vision and redness.  Respiratory: Negative for cough, hemoptysis and shortness of breath.   Cardiovascular: Negative for chest pain, palpitations, orthopnea, claudication and leg swelling.  Gastrointestinal: Negative for heartburn, nausea, vomiting, diarrhea, constipation and blood in stool.  Genitourinary: Negative for dysuria, urgency, frequency and hematuria.  Musculoskeletal: Negative for myalgias, back pain, joint pain, falls and neck pain.  Skin: Negative for itching.  Neurological: Positive for dizziness and weakness. Negative for tingling, tremors, focal weakness, seizures, loss of consciousness and headaches.  Endo/Heme/Allergies: Does not bruise/bleed easily.  Psychiatric/Behavioral: Negative for depression and substance abuse. The patient is not nervous/anxious and does not have insomnia.      Objective  Filed Vitals:  05/14/15 0947  BP: 124/74  Pulse: 83  Temp: 98.2 F (36.8 C)  Resp: 16  Height: '5\' 6"'$  (1.676 m)  Weight: 254 lb 2 oz (115.27 kg)  SpO2: 94%     Physical Exam  Constitutional: She is oriented to person, place, and time and well-developed, well-nourished, and in no distress.  Moderately obese and in no acute distress  HENT:  Head: Normocephalic.  Eyes: EOM are normal. Pupils are equal, round, and reactive to  light.  Neck: Normal range of motion. No thyromegaly present.  Cardiovascular: Normal rate, regular rhythm and normal heart sounds.   No murmur heard. Pulmonary/Chest: Effort normal and breath sounds normal.  Abdominal: Soft. Bowel sounds are normal.  Musculoskeletal: Normal range of motion. She exhibits no edema.  Neurological: She is alert and oriented to person, place, and time. No cranial nerve deficit. Gait normal.  No ataxia or dysmetria. Rapid alternating movements is performed without difficulty  Skin: Skin is warm and dry. No rash noted.  Psychiatric: Memory and affect normal.      Assessment & Plan  1. Ataxia Possibly secondary to dizziness  2. Uncontrolled type 2 diabetes with neuropathy Start Bydureon - POCT HgB A1C - POCT Glucose (CBG) - meclizine (ANTIVERT) 25 MG tablet; Take 1 tablet (25 mg total) by mouth 3 (three) times daily as needed for dizziness.  Dispense: 30 tablet; Refill: 0 - glucose blood (FREESTYLE TEST STRIPS) test strip; Use as instructed  Dispense: 50 each; Refill: 12 - Exenatide ER (BYDUREON) 2 MG PEN; Inject 1 pen into the skin once a week.  Dispense: 1 each; Refill: 7  3. Dizziness Probable benign positional vertigo  4. Need for influenza vaccination Given today - Flu Vaccine QUAD 36+ mos PF IM (Fluarix & Fluzone Quad PF)  5. Essential hypertension Well-controlled  6. Hyperlipidemia

## 2015-05-15 ENCOUNTER — Other Ambulatory Visit: Payer: Self-pay

## 2015-05-15 MED ORDER — CANAGLIFLOZIN 100 MG PO TABS: 100.0000 mg | ORAL_TABLET | Freq: Every day | ORAL | Status: DC

## 2015-06-11 ENCOUNTER — Ambulatory Visit: Payer: Commercial Managed Care - HMO | Admitting: Family Medicine

## 2015-06-18 ENCOUNTER — Encounter (INDEPENDENT_AMBULATORY_CARE_PROVIDER_SITE_OTHER): Payer: Self-pay

## 2015-06-18 ENCOUNTER — Encounter: Payer: Self-pay | Admitting: Family Medicine

## 2015-06-18 ENCOUNTER — Ambulatory Visit (INDEPENDENT_AMBULATORY_CARE_PROVIDER_SITE_OTHER): Payer: Commercial Managed Care - HMO | Admitting: Family Medicine

## 2015-06-18 VITALS — BP 120/68 | HR 80 | Temp 97.9°F | Resp 16 | Ht 66.0 in | Wt 249.4 lb

## 2015-06-18 DIAGNOSIS — M5090 Cervical disc disorder, unspecified, unspecified cervical region: Secondary | ICD-10-CM

## 2015-06-18 DIAGNOSIS — G629 Polyneuropathy, unspecified: Secondary | ICD-10-CM

## 2015-06-18 DIAGNOSIS — E1169 Type 2 diabetes mellitus with other specified complication: Secondary | ICD-10-CM | POA: Diagnosis not present

## 2015-06-18 DIAGNOSIS — E0841 Diabetes mellitus due to underlying condition with diabetic mononeuropathy: Secondary | ICD-10-CM

## 2015-06-18 DIAGNOSIS — I10 Essential (primary) hypertension: Secondary | ICD-10-CM | POA: Diagnosis not present

## 2015-06-18 LAB — GLUCOSE, POCT (MANUAL RESULT ENTRY): POC GLUCOSE: 105 mg/dL — AB (ref 70–99)

## 2015-06-18 MED ORDER — CANAGLIFLOZIN 100 MG PO TABS: 100.0000 mg | ORAL_TABLET | Freq: Every day | ORAL | Status: DC

## 2015-06-18 NOTE — Progress Notes (Signed)
Name: Mackenzie Park   MRN: [id number]    DOB: 02/16/1946   Date:06/18/2015       Progress Note  Subjective  Chief Complaint  Chief Complaint  Patient presents with  . Diabetes    pt here for 4 month follow up  . Obesity  . Hypertension    HPI  Diabetes  Patient presents for follow-up of diabetes which is present for over 5 years. Is currently on a regimen of Bydureon on 2 mg once weekly, and Invokana 100 mg. Patient states her compliance with their diet and exercise. There's been no hypoglycemic episodes and there no polyuria polydipsia polyphagia. His average fasting glucoses been in the low around low to mid 100s with a high around 150s . There is no end organ disease.  Last diabetic eye exam was less than a year ago.   Last visit with dietitian was earlier this year. Last microalbumin was obtained in February 2016 and was 0 .   Follow-up of dizziness and ataxia  Patient states that her problem with dizziness and ataxia has completely resolved  Hypertension   Patient presents for follow-up of hypertension. It has been present for over 5 years.  Patient states that there is compliance with medical regimen which consists of lisinopril with hydrochlorothiazide . There is no end organ disease. Cardiac risk factors include hypertension hyperlipidemia and diabetes.  Exercise regimen consist of minimal.  Diet consist of ADA has been prescribed  Past Medical History  Diagnosis Date  . Hypercholesteremia   . Diabetes mellitus ([place])   . Lumbosacral neuritis   . Osteoarthritis   . Allergic rhinitis   . Depressive disorder     Social History  Substance Use Topics  . Smoking status: Former Smoker    Quit date: 08/20/1995  . Smokeless tobacco: Not on file  . Alcohol Use: No     Current outpatient prescriptions:  .  acetaminophen (TYLENOL) 650 MG CR tablet, Take 650 mg by mouth at bedtime as needed for pain., Disp: , Rfl:  .  aspirin EC 81 MG tablet, Take 81 mg by mouth daily.,  Disp: , Rfl:  .  atorvastatin (LIPITOR) 20 MG tablet, Take 20 mg by mouth at bedtime., Disp: , Rfl:  .  canagliflozin (INVOKANA) 100 MG TABS tablet, Take 1 tablet (100 mg total) by mouth daily., Disp: 30 tablet, Rfl: 1 .  Exenatide ER (BYDUREON) 2 MG PEN, Inject 1 pen into the skin once a week., Disp: 1 each, Rfl: 7 .  fexofenadine (ALLEGRA) 180 MG tablet, Take 180 mg by mouth daily., Disp: , Rfl:  .  gabapentin (NEURONTIN) 300 MG capsule, Take 300 mg by mouth 3 (three) times daily., Disp: , Rfl:  .  glucose blood (FREESTYLE TEST STRIPS) test strip, Use as instructed, Disp: 50 each, Rfl: 12 .  lisinopril-hydrochlorothiazide (PRINZIDE,ZESTORETIC) 20-12.5 MG per tablet, Take 1 tablet by mouth daily., Disp: 30 tablet, Rfl: 3 .  meclizine (ANTIVERT) 25 MG tablet, Take 1 tablet (25 mg total) by mouth 3 (three) times daily as needed for dizziness., Disp: 30 tablet, Rfl: 0 .  meloxicam (MOBIC) 15 MG tablet, Take 1 tablet (15 mg total) by mouth daily., Disp: 30 tablet, Rfl: 3  Allergies  Allergen Reactions  . Sulfa Antibiotics Anaphylaxis and Hives    Review of Systems  Constitutional: Negative for fever, chills and weight loss.  HENT: Negative for congestion, hearing loss, sore throat and tinnitus.   Eyes: Negative for blurred vision, double vision  and redness.  Respiratory: Negative for cough, hemoptysis and shortness of breath.   Cardiovascular: Negative for chest pain, palpitations, orthopnea, claudication and leg swelling.  Gastrointestinal: Negative for heartburn, nausea, vomiting, diarrhea, constipation and blood in stool.  Genitourinary: Negative for dysuria, urgency, frequency and hematuria.  Musculoskeletal: Positive for joint pain. Negative for myalgias, back pain, falls and neck pain.  Skin: Negative for itching.  Neurological: Negative for dizziness, tingling, tremors, focal weakness, seizures, loss of consciousness, weakness and headaches.  Endo/Heme/Allergies: Does not bruise/bleed  easily.  Psychiatric/Behavioral: Negative for depression and substance abuse. The patient is not nervous/anxious and does not have insomnia.      Objective  Filed Vitals:   06/18/15 0852  BP: 120/68  Pulse: 80  Temp: 97.9 F (36.6 C)  Resp: 16  Height: '5\' 6"'$  (1.676 m)  Weight: 249 lb 7 oz (113.144 kg)  SpO2: 96%     Physical Exam  Constitutional: She is oriented to person, place, and time and well-developed, well-nourished, and in no distress.  Morbid obesity  HENT:  Head: Normocephalic.  Eyes: EOM are normal. Pupils are equal, round, and reactive to light.  Neck: Normal range of motion. No thyromegaly present.  Cardiovascular: Normal rate, regular rhythm, normal heart sounds and intact distal pulses.   No murmur heard. Pulmonary/Chest: Effort normal and breath sounds normal.  Abdominal: Soft. Bowel sounds are normal.  Musculoskeletal: Normal range of motion. She exhibits no edema.  Neurological: She is alert and oriented to person, place, and time. No cranial nerve deficit. Gait normal.  Skin: Skin is warm and dry. No rash noted.  Psychiatric: Memory and affect normal.      Assessment & Plan   1. Type 2 diabetes mellitus with other specified complication (HCC) Under better control - POCT Glucose (CBG)  2. Essential hypertension Well-controlled  3. Diabetic mononeuropathy associated with diabetes mellitus due to underlying condition ([place]) As above  4. Neuropathy (HCC) Improved on gabapentin  5. Cervical disc disease Improved

## 2015-07-01 ENCOUNTER — Other Ambulatory Visit: Payer: Self-pay | Admitting: Family Medicine

## 2015-08-31 ENCOUNTER — Other Ambulatory Visit: Payer: Self-pay | Admitting: Family Medicine

## 2015-09-11 ENCOUNTER — Other Ambulatory Visit: Payer: Self-pay | Admitting: Family Medicine

## 2015-09-17 ENCOUNTER — Ambulatory Visit: Payer: Commercial Managed Care - HMO | Admitting: Family Medicine

## 2015-09-23 ENCOUNTER — Other Ambulatory Visit: Payer: Self-pay | Admitting: Family Medicine

## 2015-09-29 ENCOUNTER — Ambulatory Visit: Payer: Commercial Managed Care - HMO | Admitting: Family Medicine

## 2015-10-02 ENCOUNTER — Other Ambulatory Visit: Payer: Self-pay | Admitting: Family Medicine

## 2015-10-22 ENCOUNTER — Other Ambulatory Visit: Payer: Self-pay | Admitting: Family Medicine

## 2016-01-26 DIAGNOSIS — H905 Unspecified sensorineural hearing loss: Secondary | ICD-10-CM | POA: Diagnosis not present

## 2016-02-15 ENCOUNTER — Ambulatory Visit (INDEPENDENT_AMBULATORY_CARE_PROVIDER_SITE_OTHER): Payer: Commercial Managed Care - HMO | Admitting: Family Medicine

## 2016-02-15 ENCOUNTER — Encounter: Payer: Self-pay | Admitting: Family Medicine

## 2016-02-15 VITALS — BP 120/71 | HR 99 | Temp 98.6°F | Resp 18 | Ht 66.0 in | Wt 247.9 lb

## 2016-02-15 DIAGNOSIS — H5203 Hypermetropia, bilateral: Secondary | ICD-10-CM | POA: Diagnosis not present

## 2016-02-15 DIAGNOSIS — H5200 Hypermetropia, unspecified eye: Secondary | ICD-10-CM | POA: Insufficient documentation

## 2016-02-15 NOTE — Progress Notes (Signed)
Name: Mackenzie Park   MRN: [id number]    DOB: May 17, 1946   Date:02/15/2016       Progress Note  Subjective  Chief Complaint  Chief Complaint  Patient presents with  . Referral    eye    HPI  Referral to Ophthalmologist: Pt. Presents to obtain referral for Ophthalmology, she is scheduled to see Dr. Matilde Sprang on June 27th for eye exam and 'fixing my reading glasses'. She has trouble reading and seeing 'close-up things.'  Past Medical History  Diagnosis Date  . Hypercholesteremia   . Diabetes mellitus ([place])   . Lumbosacral neuritis   . Osteoarthritis   . Allergic rhinitis   . Depressive disorder     Past Surgical History  Procedure Laterality Date  . Bariatric surgery    . Replacement total knee bilateral      Family History  Problem Relation Age of Onset  . Cancer Mother   . Dementia Father   . COPD Sister   . Cancer Sister     Social History   Social History  . Marital Status: Single    Spouse Name: N/A  . Number of Children: N/A  . Years of Education: N/A   Occupational History  . Not on file.   Social History Main Topics  . Smoking status: Former Smoker    Quit date: 08/20/1995  . Smokeless tobacco: Not on file  . Alcohol Use: No  . Drug Use: No  . Sexual Activity: Not on file   Other Topics Concern  . Not on file   Social History Narrative     Current outpatient prescriptions:  .  acetaminophen (TYLENOL) 650 MG CR tablet, Take 650 mg by mouth at bedtime as needed for pain., Disp: , Rfl:  .  aspirin EC 81 MG tablet, Take 81 mg by mouth daily., Disp: , Rfl:  .  atorvastatin (LIPITOR) 20 MG tablet, TAKE 1 TABLET EVERY DAY, Disp: 90 tablet, Rfl: 3 .  Exenatide ER (BYDUREON) 2 MG PEN, Inject 1 pen into the skin once a week., Disp: 1 each, Rfl: 7 .  gabapentin (NEURONTIN) 300 MG capsule, Take 300 mg by mouth 3 (three) times daily., Disp: , Rfl:  .  glucose blood (FREESTYLE TEST STRIPS) test strip, Use as instructed, Disp: 50 each, Rfl: 12 .   lisinopril-hydrochlorothiazide (PRINZIDE,ZESTORETIC) 20-12.5 MG tablet, TAKE 1 TABLET EVERY DAY, Disp: 90 tablet, Rfl: 1 .  meclizine (ANTIVERT) 25 MG tablet, TAKE 1 TABLET THREE TIMES DAILY AS NEEDED FOR  DIZZINESS, Disp: 30 tablet, Rfl: 0 .  meloxicam (MOBIC) 15 MG tablet, TAKE 1 TABLET EVERY DAY, Disp: 90 tablet, Rfl: 1  Allergies  Allergen Reactions  . Sulfa Antibiotics Anaphylaxis and Hives     Review of Systems  Eyes: Negative for blurred vision and double vision.    Objective  Filed Vitals:   02/15/16 1339  BP: 120/71  Pulse: 99  Temp: 98.6 F (37 C)  TempSrc: Oral  Resp: 18  Height: '5\' 6"'$  (1.676 m)  Weight: 247 lb 14.4 oz (112.447 kg)  SpO2: 96%    Physical Exam  Constitutional: She is oriented to person, place, and time and well-developed, well-nourished, and in no distress.  Neurological: She is alert and oriented to person, place, and time.  Nursing note and vitals reviewed.       Assessment & Plan  1. Far-sightedness, bilateral Referral to ophthalmology. - Ambulatory referral to Ophthalmology   [facility] 02/15/2016 1:50 PM

## 2016-02-25 ENCOUNTER — Telehealth: Payer: Self-pay | Admitting: Family Medicine

## 2016-02-25 NOTE — Telephone Encounter (Signed)
NEEDS [facility] REFERRAL FOR H25.[address] [place]

## 2016-02-29 ENCOUNTER — Telehealth: Payer: Self-pay | Admitting: Family Medicine

## 2016-02-29 NOTE — Telephone Encounter (Signed)
[facility] IS ASKING FOR REFERRAL FOR PT HAS APPT TOMORROW ( 03-01-16) AT 1:30. IT IS [facility]. PLEASE CALL AT [phone]

## 2016-03-01 DIAGNOSIS — H11442 Conjunctival cysts, left eye: Secondary | ICD-10-CM | POA: Diagnosis not present

## 2016-03-01 DIAGNOSIS — H5203 Hypermetropia, bilateral: Secondary | ICD-10-CM | POA: Diagnosis not present

## 2016-03-01 DIAGNOSIS — H43813 Vitreous degeneration, bilateral: Secondary | ICD-10-CM | POA: Diagnosis not present

## 2016-03-01 DIAGNOSIS — E119 Type 2 diabetes mellitus without complications: Secondary | ICD-10-CM | POA: Diagnosis not present

## 2016-03-01 DIAGNOSIS — H35033 Hypertensive retinopathy, bilateral: Secondary | ICD-10-CM | POA: Diagnosis not present

## 2016-03-01 DIAGNOSIS — H25813 Combined forms of age-related cataract, bilateral: Secondary | ICD-10-CM | POA: Diagnosis not present

## 2016-03-01 DIAGNOSIS — H52223 Regular astigmatism, bilateral: Secondary | ICD-10-CM | POA: Diagnosis not present

## 2016-03-01 DIAGNOSIS — H524 Presbyopia: Secondary | ICD-10-CM | POA: Diagnosis not present

## 2016-03-01 DIAGNOSIS — I10 Essential (primary) hypertension: Secondary | ICD-10-CM | POA: Diagnosis not present

## 2016-03-01 NOTE — Telephone Encounter (Signed)
Spoke [facility] and put in [facility] request. Authorization # [id number] was suspended

## 2016-04-13 ENCOUNTER — Ambulatory Visit
Admission: EM | Admit: 2016-04-13 | Discharge: 2016-04-13 | Disposition: A | Discharge status: Home / Self Care | Payer: Commercial Managed Care - HMO | Attending: Family | Admitting: Family

## 2016-04-13 DIAGNOSIS — W57XXXA Bitten or stung by nonvenomous insect and other nonvenomous arthropods, initial encounter: Secondary | ICD-10-CM | POA: Diagnosis not present

## 2016-04-13 DIAGNOSIS — S80861A Insect bite (nonvenomous), right lower leg, initial encounter: Secondary | ICD-10-CM | POA: Diagnosis not present

## 2016-04-13 MED ORDER — FAMOTIDINE 20 MG PO TABS
20.0000 mg | ORAL_TABLET | Freq: Every day | ORAL | Status: AC
  Administered 2016-04-13: 20 mg via ORAL

## 2016-04-13 NOTE — Discharge Instructions (Signed)
We gave you Pepcid today which will help with the itching and not make you drowsy. Recommend cool compresses to area. Do not drain area due to risk of infection. Wash area with soap and water as usual. Do not put any other lotions or creams on area. Take Benadryl 25-'50mg'$  at night to help with itching. Follow-up with your PCP if not improving within 3 days or sooner if area becomes more red, swollen and becomes painful.

## 2016-04-13 NOTE — ED Triage Notes (Signed)
Patient presents with a blister on her lower right leg. She states this am it was a pin size blister and throughout the day it has gotten larger and it itches really bad.

## 2016-04-13 NOTE — ED Provider Notes (Signed)
CSN: [id number]     Arrival date & time 04/13/16  1635 History   First MD Initiated Contact with Patient 04/13/16 1724     Chief Complaint  Patient presents with  . Insect Bite   (Consider location/radiation/quality/duration/timing/severity/associated sxs/prior Treatment) Patient presents with a possible insect bite on her right lower leg. She was walking through the grass in her yard yesterday when she thought something may have bitten her. This morning the area was small like the size of a pin-head. It was very itchy while she was at work today and she put Nystatin cream on the area. Now the area has swollen to the size of a quarter- red with clear fluid. Not painful. No surrounding redness. But still very itchy. She has not taken anything orally for itching. She is a diabetic and sugars are usually in the low 100's.      Past Medical History:  Diagnosis Date  . Allergic rhinitis   . Depressive disorder   . Diabetes mellitus ([place])   . Hypercholesteremia   . Lumbosacral neuritis   . Osteoarthritis    Past Surgical History:  Procedure Laterality Date  . BARIATRIC SURGERY    . REPLACEMENT TOTAL KNEE BILATERAL     Family History  Problem Relation Age of Onset  . Dementia Father   . COPD Sister   . Cancer Sister   . Cancer Mother    Social History  Substance Use Topics  . Smoking status: Former Smoker    Quit date: 08/20/1995  . Smokeless tobacco: Never Used  . Alcohol use No   OB History    No data available     Review of Systems  Constitutional: Negative for chills and fever.  Musculoskeletal: Negative for joint swelling.  Skin: Negative for rash.       Bite/swelling on leg  Neurological: Negative for numbness.    Allergies  Sulfa antibiotics  Home Medications   Prior to Admission medications   Medication Sig Start Date End Date Taking? Authorizing Provider  acetaminophen (TYLENOL) 650 MG CR tablet Take 650 mg by mouth at bedtime as needed for pain.   Yes  Historical Provider, MD  aspirin EC 81 MG tablet Take 81 mg by mouth daily.   Yes Historical Provider, MD  atorvastatin (LIPITOR) 20 MG tablet TAKE 1 TABLET EVERY DAY 09/01/15  Yes Ashok Norris, MD  lisinopril-hydrochlorothiazide (PRINZIDE,ZESTORETIC) 20-12.5 MG tablet TAKE 1 TABLET EVERY DAY 10/23/15  Yes Ashok Norris, MD  meloxicam (MOBIC) 15 MG tablet TAKE 1 TABLET EVERY DAY 10/23/15  Yes Ashok Norris, MD  Exenatide ER (BYDUREON) 2 MG PEN Inject 1 pen into the skin once a week. 05/14/15   Ashok Norris, MD  gabapentin (NEURONTIN) 300 MG capsule Take 300 mg by mouth 3 (three) times daily.    Historical Provider, MD  glucose blood (FREESTYLE TEST STRIPS) test strip Use as instructed 05/14/15   Ashok Norris, MD  meclizine (ANTIVERT) 25 MG tablet TAKE 1 TABLET THREE TIMES DAILY AS NEEDED FOR  DIZZINESS 09/23/15   Ashok Norris, MD   Meds Ordered and Administered this Visit   Medications  famotidine (PEPCID) tablet 20 mg (20 mg Oral Given 04/13/16 1746)    BP 138/82 (BP Location: Left Arm)   Pulse 69   Temp 97.5 F (36.4 C) (Oral)   Resp 18   Ht '5\' 6"'$  (1.676 m)   Wt 242 lb (109.8 kg)   SpO2 98%   BMI 39.06 kg/m  No data found.  Physical Exam  Constitutional: She is oriented to person, place, and time. She appears well-developed and well-nourished. No distress.  Cardiovascular: Normal rate and regular rhythm.   Pulmonary/Chest: Effort normal and breath sounds normal.  Musculoskeletal: Normal range of motion.  Neurological: She is alert and oriented to person, place, and time. She has normal strength. No sensory deficit.  Skin: Skin is warm, dry and intact. Capillary refill takes less than 2 seconds. Lesion noted.     Red, about 1cm round lesion on right lower anterior aspect of leg. Clear fluid filled raised lesion. Non-tender. No warmth. No signs of infection. Skin surrounding area appears irritated from scratching skin. No other lesions or rash present.   Psychiatric:  She has a normal mood and affect. Her behavior is normal. Judgment and thought content normal.    Urgent Care Course   Clinical Course    Procedures (including critical care time)  Labs Review Labs Reviewed - No data to display  Imaging Review No results found.   Visual Acuity Review  Right Eye Distance:   Left Eye Distance:   Bilateral Distance:    Right Eye Near:   Left Eye Near:    Bilateral Near:         MDM   1. Insect bite of right leg, initial encounter    Discussed that we could try to drain lesion but concerned over risk of infection with opening wound- especially since she is diabetic. Discussed that clinical findings indicate a mild allergic reaction to the insect bite and probably also more of an allergic reaction to the Nystatin. Encouraged to wash area with normal soap and water. Do not put any other creams or lotions on area. May apply cool compresses to area to help with itching. Gave patient oral Pepcid here to help with itching since patient is going back to work and will not be able to take Benadryl. Recommend oral OTC Benadryl 25-'50mg'$  tonight before bed to help with itching. Continue to monitor area and if swelling increases, any pain occurs or discolored drainage occurs, go to ER. Otherwise, follow-up as needed.     Katy Apo, NP 04/14/16 [phone]

## 2016-04-15 DIAGNOSIS — S80821A Blister (nonthermal), right lower leg, initial encounter: Secondary | ICD-10-CM | POA: Diagnosis not present

## 2016-04-15 DIAGNOSIS — L089 Local infection of the skin and subcutaneous tissue, unspecified: Secondary | ICD-10-CM | POA: Diagnosis not present

## 2016-06-01 ENCOUNTER — Other Ambulatory Visit: Payer: Self-pay | Admitting: Family Medicine

## 2016-06-01 NOTE — Telephone Encounter (Signed)
Requesting refill on meloxicam, atorvastatin and lisinopril. States these prescriptions work great for her and is asking that you please send to them to [facility]. Will be completely out within 2 weeks.

## 2016-06-15 DIAGNOSIS — H35033 Hypertensive retinopathy, bilateral: Secondary | ICD-10-CM | POA: Diagnosis not present

## 2016-06-15 DIAGNOSIS — H11442 Conjunctival cysts, left eye: Secondary | ICD-10-CM | POA: Diagnosis not present

## 2016-06-15 DIAGNOSIS — H5203 Hypermetropia, bilateral: Secondary | ICD-10-CM | POA: Diagnosis not present

## 2016-06-15 DIAGNOSIS — I10 Essential (primary) hypertension: Secondary | ICD-10-CM | POA: Diagnosis not present

## 2016-06-15 DIAGNOSIS — H524 Presbyopia: Secondary | ICD-10-CM | POA: Diagnosis not present

## 2016-06-15 DIAGNOSIS — H25813 Combined forms of age-related cataract, bilateral: Secondary | ICD-10-CM | POA: Diagnosis not present

## 2016-06-15 DIAGNOSIS — E119 Type 2 diabetes mellitus without complications: Secondary | ICD-10-CM | POA: Diagnosis not present

## 2016-06-15 DIAGNOSIS — H52223 Regular astigmatism, bilateral: Secondary | ICD-10-CM | POA: Diagnosis not present

## 2016-06-15 DIAGNOSIS — H43813 Vitreous degeneration, bilateral: Secondary | ICD-10-CM | POA: Diagnosis not present

## 2016-06-17 ENCOUNTER — Other Ambulatory Visit: Payer: Self-pay | Admitting: Pharmacist

## 2016-06-17 NOTE — Patient Outreach (Signed)
Outreach call to Mackenzie Park regarding her request for follow up from the [facility] Medication Adherence Campaign. Left a HIPAA compliant message on the patient's voicemail.  Harlow Asa, PharmD Clinical Pharmacist [facility] [phone]

## 2016-09-12 ENCOUNTER — Ambulatory Visit (INDEPENDENT_AMBULATORY_CARE_PROVIDER_SITE_OTHER): Payer: Commercial Managed Care - HMO

## 2016-09-12 DIAGNOSIS — Z23 Encounter for immunization: Secondary | ICD-10-CM | POA: Diagnosis not present

## 2016-09-13 ENCOUNTER — Ambulatory Visit: Payer: Commercial Managed Care - HMO | Admitting: Family Medicine

## 2016-11-08 ENCOUNTER — Encounter: Payer: Self-pay | Admitting: Family Medicine

## 2016-11-08 ENCOUNTER — Ambulatory Visit (INDEPENDENT_AMBULATORY_CARE_PROVIDER_SITE_OTHER): Payer: Medicare HMO | Admitting: Family Medicine

## 2016-11-08 VITALS — BP 132/76 | HR 93 | Temp 98.3°F | Resp 17 | Ht 66.0 in | Wt 245.3 lb

## 2016-11-08 DIAGNOSIS — I10 Essential (primary) hypertension: Secondary | ICD-10-CM

## 2016-11-08 DIAGNOSIS — E1165 Type 2 diabetes mellitus with hyperglycemia: Secondary | ICD-10-CM

## 2016-11-08 DIAGNOSIS — R05 Cough: Secondary | ICD-10-CM | POA: Diagnosis not present

## 2016-11-08 DIAGNOSIS — E1142 Type 2 diabetes mellitus with diabetic polyneuropathy: Secondary | ICD-10-CM

## 2016-11-08 DIAGNOSIS — IMO0002 Reserved for concepts with insufficient information to code with codable children: Secondary | ICD-10-CM

## 2016-11-08 DIAGNOSIS — E7800 Pure hypercholesterolemia, unspecified: Secondary | ICD-10-CM

## 2016-11-08 DIAGNOSIS — R058 Other specified cough: Secondary | ICD-10-CM

## 2016-11-08 LAB — GLUCOSE, POCT (MANUAL RESULT ENTRY): POC GLUCOSE: 162 mg/dL — AB (ref 70–99)

## 2016-11-08 LAB — POCT GLYCOSYLATED HEMOGLOBIN (HGB A1C): HEMOGLOBIN A1C: 7.4

## 2016-11-08 MED ORDER — SITAGLIPTIN PHOSPHATE 100 MG PO TABS: 100.0000 mg | ORAL_TABLET | Freq: Every day | ORAL | 0 refills | Status: DC

## 2016-11-08 MED ORDER — GABAPENTIN 300 MG PO CAPS: 300.0000 mg | ORAL_CAPSULE | Freq: Every day | ORAL | 0 refills | Status: AC

## 2016-11-08 MED ORDER — MELOXICAM 15 MG PO TABS: 15.0000 mg | ORAL_TABLET | Freq: Every day | ORAL | 0 refills | Status: DC

## 2016-11-08 MED ORDER — BENZONATATE 100 MG PO CAPS: 100.0000 mg | ORAL_CAPSULE | Freq: Three times a day (TID) | ORAL | 0 refills | Status: DC | PRN

## 2016-11-08 MED ORDER — ATORVASTATIN CALCIUM 20 MG PO TABS: 20.0000 mg | ORAL_TABLET | Freq: Every day | ORAL | 0 refills | Status: DC

## 2016-11-08 MED ORDER — LISINOPRIL-HYDROCHLOROTHIAZIDE 20-12.5 MG PO TABS: 1.0000 | ORAL_TABLET | Freq: Every day | ORAL | 0 refills | Status: DC

## 2016-11-08 NOTE — Progress Notes (Signed)
Name: Mackenzie Park   MRN: [id number]    DOB: Aug 22, 1946   Date:11/08/2016       Progress Note  Subjective  Chief Complaint  Chief Complaint  Patient presents with  . Medication Refill    Diabetes  She presents for her follow-up diabetic visit. She has type 2 diabetes mellitus. Her disease course has been worsening. Hypoglycemia symptoms include nervousness/anxiousness and sleepiness. Pertinent negatives for hypoglycemia include no headaches. Associated symptoms include foot paresthesias. Pertinent negatives for diabetes include no chest pain, no fatigue, no polydipsia and no polyuria. Diabetic complications include peripheral neuropathy. Pertinent negatives for diabetic complications include no CVA or heart disease. Current diabetic treatment includes diet. She is following a generally healthy diet. Frequency home blood tests: she does not check her blood glucose. An ACE inhibitor/angiotensin II receptor blocker is being taken. She does not see a podiatrist.Eye exam is current.  Hypertension  This is a chronic problem. The problem is unchanged. The problem is controlled. Pertinent negatives include no chest pain, headaches, palpitations or shortness of breath. Past treatments include ACE inhibitors and diuretics. There is no history of kidney disease, CAD/MI or CVA.  Hyperlipidemia  This is a chronic problem. The problem is controlled. Recent lipid tests were reviewed and are normal. Pertinent negatives include no chest pain, leg pain, myalgias or shortness of breath. Current antihyperlipidemic treatment includes statins.     Past Medical History:  Diagnosis Date  . Allergic rhinitis   . Depressive disorder   . Diabetes mellitus ([place])   . Hypercholesteremia   . Lumbosacral neuritis   . Osteoarthritis     Past Surgical History:  Procedure Laterality Date  . BARIATRIC SURGERY    . REPLACEMENT TOTAL KNEE BILATERAL      Family History  Problem Relation Age of Onset  . Dementia  Father   . COPD Sister   . Cancer Sister   . Cancer Mother     Social History   Social History  . Marital status: Single    Spouse name: N/A  . Number of children: N/A  . Years of education: N/A   Occupational History  . Not on file.   Social History Main Topics  . Smoking status: Former Smoker    Quit date: 08/20/1995  . Smokeless tobacco: Never Used  . Alcohol use No  . Drug use: No  . Sexual activity: Not on file   Other Topics Concern  . Not on file   Social History Narrative  . No narrative on file     Current Outpatient Prescriptions:  .  acetaminophen (TYLENOL) 650 MG CR tablet, Take 650 mg by mouth at bedtime as needed for pain., Disp: , Rfl:  .  aspirin EC 81 MG tablet, Take 81 mg by mouth daily., Disp: , Rfl:  .  atorvastatin (LIPITOR) 20 MG tablet, TAKE 1 TABLET EVERY DAY, Disp: 90 tablet, Rfl: 3 .  gabapentin (NEURONTIN) 300 MG capsule, Take 300 mg by mouth 3 (three) times daily., Disp: , Rfl:  .  glucose blood (FREESTYLE TEST STRIPS) test strip, Use as instructed, Disp: 50 each, Rfl: 12 .  lisinopril-hydrochlorothiazide (PRINZIDE,ZESTORETIC) 20-12.5 MG tablet, TAKE 1 TABLET EVERY DAY, Disp: 90 tablet, Rfl: 1 .  meloxicam (MOBIC) 15 MG tablet, TAKE 1 TABLET EVERY DAY, Disp: 90 tablet, Rfl: 1  Allergies  Allergen Reactions  . Sulfa Antibiotics Anaphylaxis and Hives     Review of Systems  Constitutional: Negative for fatigue.  Respiratory: Negative for shortness  of breath.   Cardiovascular: Negative for chest pain and palpitations.  Musculoskeletal: Negative for myalgias.  Neurological: Negative for headaches.  Endo/Heme/Allergies: Negative for polydipsia.  Psychiatric/Behavioral: The patient is nervous/anxious.       Objective  Vitals:   11/08/16 1001  BP: 132/76  Pulse: 93  Resp: 17  Temp: 98.3 F (36.8 C)  TempSrc: Oral  SpO2: 96%  Weight: 245 lb 4.8 oz (111.3 kg)  Height: '5\' 6"'$  (1.676 m)    Physical Exam  Constitutional: She is  well-developed, well-nourished, and in no distress.  HENT:  Head: Normocephalic and atraumatic.  Right Ear: Tympanic membrane and ear canal normal.  Left Ear: Tympanic membrane and ear canal normal.  Mouth/Throat: Posterior oropharyngeal erythema present. No posterior oropharyngeal edema.  Cardiovascular: Normal rate, regular rhythm, S1 normal, S2 normal and normal heart sounds.   No murmur heard. Pulmonary/Chest: Effort normal and breath sounds normal. No respiratory distress. She has no wheezes. She has no rhonchi.  Abdominal: Soft. Bowel sounds are normal. There is no tenderness.  Musculoskeletal:       Right ankle: She exhibits swelling.       Left ankle: She exhibits swelling.  Psychiatric: Mood, memory, affect and judgment normal.  Nursing note and vitals reviewed.    Recent Results (from the past 2160 hour(s))  POCT HgB A1C     Status: Abnormal   Collection Time: 11/08/16 10:12 AM  Result Value Ref Range   Hemoglobin A1C 7.4   POCT Glucose (CBG)     Status: Abnormal   Collection Time: 11/08/16 10:12 AM  Result Value Ref Range   POC Glucose 162 (A) 70 - 99 mg/dl     Assessment & Plan  1. Uncontrolled type 2 diabetes mellitus with peripheral neuropathy (HCC) Point-of-care A1c is elevated at 7.4%, start on Januvia 100 mg daily, gabapentin at bedtime for relief of peripheral neuropathy. Advised to start physical activity, reassess in 3 months - POCT HgB A1C - POCT Glucose (CBG) - sitaGLIPtin (JANUVIA) 100 MG tablet; Take 1 tablet (100 mg total) by mouth daily.  Dispense: 90 tablet; Refill: 0 - gabapentin (NEURONTIN) 300 MG capsule; Take 1 capsule (300 mg total) by mouth at bedtime.  Dispense: 90 capsule; Refill: 0 - Urine Microalbumin w/creat. ratio  2. Essential hypertension BP stable and controlled on present anti- hypertensive therapy  3. Pure hypercholesterolemia Obtain FLP, continue on statin - COMPLETE METABOLIC PANEL WITH GFR - Lipid panel  4. Cough present  for greater than 3 weeks  - benzonatate (TESSALON PERLES) 100 MG capsule; Take 1 capsule (100 mg total) by mouth 3 (three) times daily as needed for cough.  Dispense: 20 capsule; Refill: 0  Carlean Crowl [facility] 11/08/2016 10:19 AM

## 2016-11-09 ENCOUNTER — Telehealth: Payer: Self-pay | Admitting: Family Medicine

## 2016-11-09 LAB — MICROALBUMIN / CREATININE URINE RATIO
CREATININE, URINE: 133 mg/dL (ref 20–320)
Microalb Creat Ratio: 58 mcg/mg creat — ABNORMAL HIGH (ref <30)
Microalb, Ur: 7.7 mg/dL

## 2016-11-09 NOTE — Telephone Encounter (Signed)
She should check with her insurance plan on the alternative DPP 4 inhibitors that are covered.

## 2016-11-09 NOTE — Telephone Encounter (Signed)
Pt was prescribed [place] but she was not able to get it because it would cost $312. Please advise [phone]

## 2016-11-10 NOTE — Telephone Encounter (Signed)
Pt verbally informed.

## 2016-11-11 ENCOUNTER — Telehealth: Payer: Self-pay | Admitting: Family Medicine

## 2016-11-11 NOTE — Telephone Encounter (Signed)
Pt scheduled an appt °

## 2016-11-11 NOTE — Telephone Encounter (Signed)
Pt states she can not afford the Januvia. Pt states this medication would cost her $295.00. The alternatives were also around the same price. Pt states she is going to try and her DM under control herself.

## 2016-11-11 NOTE — Telephone Encounter (Signed)
If the drugs within the DPP 4 classes are too expensive, she should schedule an appointment to discuss starting on a different agent such as from the SGLT-2i class, she should be on at least 1 medication for diabetes along with diet and lifestyle changes to control her disease.

## 2016-11-16 ENCOUNTER — Other Ambulatory Visit: Payer: Self-pay | Admitting: Emergency Medicine

## 2016-11-16 ENCOUNTER — Telehealth: Payer: Self-pay | Admitting: Family Medicine

## 2016-11-16 DIAGNOSIS — E1165 Type 2 diabetes mellitus with hyperglycemia: Principal | ICD-10-CM

## 2016-11-16 DIAGNOSIS — E1142 Type 2 diabetes mellitus with diabetic polyneuropathy: Secondary | ICD-10-CM

## 2016-11-16 DIAGNOSIS — IMO0002 Reserved for concepts with insufficient information to code with codable children: Secondary | ICD-10-CM

## 2016-11-16 DIAGNOSIS — E7800 Pure hypercholesterolemia, unspecified: Secondary | ICD-10-CM | POA: Diagnosis not present

## 2016-11-16 MED ORDER — SITAGLIPTIN PHOSPHATE 100 MG PO TABS: 100.0000 mg | ORAL_TABLET | Freq: Every day | ORAL | 0 refills | Status: AC

## 2016-11-16 NOTE — Telephone Encounter (Signed)
Script sent to [facility], script at [facility] cancelled out

## 2016-11-16 NOTE — Telephone Encounter (Signed)
Requesting a prescription for Januvia to be sent to [place]. States she realize that it was sent to [facility] however it was going to her $300 but only like $140 at [facility]

## 2016-11-17 LAB — COMPLETE METABOLIC PANEL WITH GFR
ALT: 68 U/L — ABNORMAL HIGH (ref 6–29)
AST: 62 U/L — ABNORMAL HIGH (ref 10–35)
Albumin: 3.8 g/dL (ref 3.6–5.1)
Alkaline Phosphatase: 102 U/L (ref 33–130)
BUN: 15 mg/dL (ref 7–25)
CALCIUM: 9.6 mg/dL (ref 8.6–10.4)
CHLORIDE: 101 mmol/L (ref 98–110)
CO2: 33 mmol/L — ABNORMAL HIGH (ref 20–31)
CREATININE: 0.66 mg/dL (ref 0.50–0.99)
Glucose, Bld: 141 mg/dL — ABNORMAL HIGH (ref 65–99)
POTASSIUM: 4.2 mmol/L (ref 3.5–5.3)
Sodium: 142 mmol/L (ref 135–146)
Total Bilirubin: 0.4 mg/dL (ref 0.2–1.2)
Total Protein: 6.8 g/dL (ref 6.1–8.1)

## 2016-11-17 LAB — LIPID PANEL
CHOL/HDL RATIO: 2.6 ratio (ref <5.0)
CHOLESTEROL: 138 mg/dL (ref <200)
HDL: 53 mg/dL (ref >50)
LDL Cholesterol: 75 mg/dL (ref <100)
TRIGLYCERIDES: 52 mg/dL (ref <150)
VLDL: 10 mg/dL (ref <30)

## 2016-11-21 ENCOUNTER — Ambulatory Visit (INDEPENDENT_AMBULATORY_CARE_PROVIDER_SITE_OTHER): Payer: Medicare HMO | Admitting: Family Medicine

## 2016-11-22 NOTE — Progress Notes (Signed)
This encounter was created in error - please disregard.

## 2016-12-07 ENCOUNTER — Telehealth: Payer: Self-pay | Admitting: Family Medicine

## 2016-12-07 ENCOUNTER — Ambulatory Visit
Admission: EM | Admit: 2016-12-07 | Discharge: 2016-12-07 | Disposition: A | Discharge status: Home / Self Care | Payer: Medicare HMO | Attending: Family Medicine | Admitting: Family Medicine

## 2016-12-07 ENCOUNTER — Encounter: Payer: Self-pay | Admitting: *Deleted

## 2016-12-07 DIAGNOSIS — R5383 Other fatigue: Secondary | ICD-10-CM

## 2016-12-07 DIAGNOSIS — R739 Hyperglycemia, unspecified: Secondary | ICD-10-CM | POA: Diagnosis not present

## 2016-12-07 LAB — GLUCOSE, CAPILLARY: GLUCOSE-CAPILLARY: 370 mg/dL — AB (ref 65–99)

## 2016-12-07 NOTE — ED Triage Notes (Signed)
Gen weakness, fatigue, drowsiness, lower extremity edema (left worse than right) and upper quadrant abdominal discomfort since Sunday. Denies any recent chest pain.

## 2016-12-07 NOTE — ED Provider Notes (Signed)
[facility]    CSN: [id number] Arrival date & time: 12/07/16  1011     History   Chief Complaint Chief Complaint  Patient presents with  . Generalized Body Aches  . Fatigue  . Leg Swelling  . Abdominal Pain    HPI Mackenzie Park is a 71 y.o. female.   Patient 71 year old black female comes of weakness and general malaise but abdominal pain and generalized swelling of her legs. She has been seen her PCP. Initially she's lost a lot of weight and was able to come off all diabetic medication. She's never been able to tolerate metformin because of abdominal discomfort and other issues. She reports that Dr. Manuella Ghazi recently placed her on straight Januvia to try to get her blood sugars down. She only took very few dosages and a February when she read the package insert stop taking. Since then she's had increase in frequency but no burning with urination. She states she felt dehydrated so earlier this week and these suggest that she start drinking Gatorade which she's been doing. She's been checking her blood sugar by her meter read that her blood sugars in the 400 range. She assumed the meter was effective and is ordered another meter which she has not gotten low sugar here is over 300. She states states when she was seen by Dr. Brigitte Pulse it was about 170 in mid-February. She reports abdominal pain is more in the epigastric area she's had her gallbladder removed, bariatric surgery in the past and replacement total knee. When she had weight loss initially her blood sugars were under good control. History of hypertension hyperlipidemia and obesity as well as her medical problems she is a former smoker and she is allergic to sulfa antibiotics   The history is provided by the patient. No language interpreter was used.  Abdominal Pain  Pain location:  Epigastric Pain quality: cramping   Pain radiates to:  Does not radiate Pain severity:  Mild Onset quality:  Sudden Timing:   Intermittent Progression:  Waxing and waning Chronicity:  New Context: previous surgery and trauma   Context: not medication withdrawal, not retching, not sick contacts and not suspicious food intake   Relieved by:  Nothing Worsened by:  Nothing Associated symptoms: fatigue     Past Medical History:  Diagnosis Date  . Allergic rhinitis   . Depressive disorder   . Diabetes mellitus ([place])   . Hypercholesteremia   . Lumbosacral neuritis   . Osteoarthritis     Patient Active Problem List   Diagnosis Date Noted  . 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 Abnormal finding on thyroid function test 02/05/2015  . Breast screening 02/05/2015  . Cervical disc disease 02/05/2015  . Pain in shoulder 02/05/2015  . DDD (degenerative disc disease), lumbar 02/05/2015  . Dizziness 02/05/2015  . Neuropathy ([place]) 02/05/2015  . Extreme obesity ([place]) 02/05/2015  . Stasis, venous 02/05/2015  . Obesity 12/08/2014  . Diabetic neuropathy ([place]) 12/08/2014  . Hypertension 12/08/2014  . Hyperlipidemia 12/08/2014  . Infraspinatus tenosynovitis 09/18/2014  . Other synovitis and tenosynovitis, right shoulder 09/18/2014  . Allergic rhinitis, seasonal 01/09/2014  . Clinical depression 11/09/2009  . Osteoarthrosis 12/19/2008  . Diabetes mellitus, type 2 ([place]) 06/18/2007    Past Surgical History:  Procedure Laterality Date  . BARIATRIC SURGERY    . REPLACEMENT TOTAL KNEE BILATERAL      OB History    No data available       Home Medications  Prior to Admission medications   Medication Sig Start Date End Date Taking? Authorizing Provider  aspirin EC 81 MG tablet Take 81 mg by mouth daily.   Yes Historical Provider, MD  gabapentin (NEURONTIN) 300 MG capsule Take 1 capsule (300 mg total) by mouth at bedtime. 11/08/16  Yes Roselee Nova, MD  lisinopril-hydrochlorothiazide (PRINZIDE,ZESTORETIC) 20-12.5 MG tablet Take 1 tablet by mouth daily. 11/08/16  Yes Roselee Nova, MD   meloxicam (MOBIC) 15 MG tablet Take 1 tablet (15 mg total) by mouth daily. 11/08/16  Yes Roselee Nova, MD  acetaminophen (TYLENOL) 650 MG CR tablet Take 650 mg by mouth at bedtime as needed for pain.    Historical Provider, MD  atorvastatin (LIPITOR) 20 MG tablet Take 1 tablet (20 mg total) by mouth daily. 11/08/16   Roselee Nova, MD  benzonatate (TESSALON PERLES) 100 MG capsule Take 1 capsule (100 mg total) by mouth 3 (three) times daily as needed for cough. 11/08/16   Roselee Nova, MD  glucose blood (FREESTYLE TEST STRIPS) test strip Use as instructed 05/14/15   Ashok Norris, MD  sitaGLIPtin (JANUVIA) 100 MG tablet Take 1 tablet (100 mg total) by mouth daily. 11/16/16   Roselee Nova, MD    Family History Family History  Problem Relation Age of Onset  . Dementia Father   . COPD Sister   . Cancer Sister   . Cancer Mother     Social History Social History  Substance Use Topics  . Smoking status: Former Smoker    Quit date: 08/20/1995  . Smokeless tobacco: Never Used  . Alcohol use No     Allergies   Sulfa antibiotics   Review of Systems Review of Systems  Constitutional: Positive for fatigue.  Gastrointestinal: Positive for abdominal pain.  Genitourinary: Positive for frequency and urgency.  Neurological: Positive for weakness.  All other systems reviewed and are negative.    Physical Exam Triage Vital Signs ED Triage Vitals  Enc Vitals Group     BP 12/07/16 1031 (!) 140/52     Pulse Rate 12/07/16 1031 70     Resp 12/07/16 1031 16     Temp 12/07/16 1031 98.2 F (36.8 C)     Temp Source 12/07/16 1031 Oral     SpO2 12/07/16 1031 96 %     Weight 12/07/16 1039 243 lb (110.2 kg)     Height 12/07/16 1039 '5\' 6"'$  (1.676 m)     Head Circumference --      Peak Flow --      Pain Score --      Pain Loc --      Pain Edu? --      Excl. in [place]? --    No data found.   Updated Vital Signs BP (!) 140/52 (BP Location: Left Arm)   Pulse 70   Temp 98.2 F (36.8  C) (Oral)   Resp 16   Ht '5\' 6"'$  (1.676 m)   Wt 243 lb (110.2 kg)   SpO2 96%   BMI 39.22 kg/m   Visual Acuity Right Eye Distance:   Left Eye Distance:   Bilateral Distance:    Right Eye Near:   Left Eye Near:    Bilateral Near:     Physical Exam  Constitutional: She is oriented to person, place, and time. She appears well-developed and well-nourished. No distress.  HENT:  Head: Normocephalic and atraumatic.  Right Ear: External ear normal.  Left Ear: External ear normal.  Eyes: Pupils are equal, round, and reactive to light.  Neck: Normal range of motion. Neck supple.  Cardiovascular: Normal rate, regular rhythm and normal heart sounds.   Pulmonary/Chest: Effort normal.  Abdominal: Soft. She exhibits no distension and no mass. There is tenderness. There is no rebound. No hernia.  Musculoskeletal: Normal range of motion. She exhibits no tenderness or deformity.  Neurological: She is alert and oriented to person, place, and time.  Skin: Skin is warm. She is not diaphoretic.  Psychiatric: She has a normal mood and affect.  Vitals reviewed.    UC Treatments / Results  Labs (all labs ordered are listed, but only abnormal results are displayed) Labs Reviewed  GLUCOSE, CAPILLARY - Abnormal; Notable for the following:       Result Value   Glucose-Capillary 370 (*)    All other components within normal limits    EKG  EKG Interpretation None       Radiology No results found.  Procedures Procedures (including critical care time)  Medications Ordered in UC Medications - No data to display Results for orders placed or performed during the hospital encounter of 12/07/16  Glucose, capillary  Result Value Ref Range   Glucose-Capillary 370 (H) 65 - 99 mg/dL    Initial Impression / Assessment and Plan / UC Course  I have reviewed the triage vital signs and the nursing notes.  Pertinent labs & imaging results that were available during my care of the patient were  reviewed by me and considered in my medical decision making (see chart for details). I have gone over with patient the findings of her blood sugar being over 300. Of also explained to her that her meter single blood sugar was over 400 earlier this week was probably not erroneous and that when she gets another meter she can recheck it with that or she can have a blood sugar checked with friends. She doesn't like she needs a third value and she is except now the fact that her blood sugars are out of control. Explained to her for blood sugars go over 500 usually meter is going to say hi and will not give her number. At that point she will need to go to the ED and possibly have some fast acting insulin given to her to get blood sugars under 300. I would also recommend follow-up with Dr. Hilliard Clark the near future as well. She is now willing to try the [place] she has some concerns about the pancreatic tumors and rat apparently Dr. Angelena Sole explained to her that she should not be at risk for that problem since the incidence was rare it was a very high doses it was only found in the rats. She really does want to take insulin she cannot tolerate metformin so she does not many options and she is in agreement that she should go back on the Januvia which she'll try. I explained to his can't take probably about a week for the Januvia to really work. She has instituted the Gator aid in the sink before she left explained to wild Gatorade may help the dehydration she was feeling it was also elevated blood sugar causing her to have the frequency. I did offer to get a urinalysis since she's not having any dysuria she has declined. Offered do a CMP and CBC amylase and lipase but she just had a CMP and Dr. Raul Del office so she also declined that as well. She was  much relieved when we could correlate that her blood sugar is a probable and she seemed to be in much better spirits now. She's had a gallbladder out she feels that she will wait  before doing anything further about the abdominal pain and were attributed the fatigue and the frequency to elevated blood sugar.    Final Clinical Impressions(s) / UC Diagnoses   Final diagnoses:  Elevated blood sugar  Fatigue, unspecified type    New Prescriptions New Prescriptions   No medications on file    Note: This dictation was prepared with Dragon dictation along with smaller phrase technology. Any transcriptional errors that result from this process are unintentional.   Frederich Cha, MD 12/07/16 1134

## 2016-12-07 NOTE — Telephone Encounter (Signed)
Pt ordered an acuu check meter on yesterday and wanted to let you know that they will need approval from you. [employer] will be sending something over

## 2016-12-07 NOTE — Discharge Instructions (Signed)
Recommend no further use of Gatorade for hydration since the sure content is high but his strength loss water for now.

## 2016-12-07 NOTE — Telephone Encounter (Signed)
Will sign once we receive and fax back to [facility]

## 2016-12-09 ENCOUNTER — Emergency Department
Admission: EM | Admit: 2016-12-09 | Discharge: 2016-12-09 | Disposition: A | Discharge status: Home / Self Care | Payer: Medicare HMO | Attending: Emergency Medicine | Admitting: Emergency Medicine

## 2016-12-09 ENCOUNTER — Telehealth: Payer: Self-pay | Admitting: Family Medicine

## 2016-12-09 ENCOUNTER — Emergency Department: Payer: Medicare HMO

## 2016-12-09 ENCOUNTER — Other Ambulatory Visit: Payer: Self-pay | Admitting: Emergency Medicine

## 2016-12-09 ENCOUNTER — Encounter: Payer: Self-pay | Admitting: Emergency Medicine

## 2016-12-09 DIAGNOSIS — E1140 Type 2 diabetes mellitus with diabetic neuropathy, unspecified: Secondary | ICD-10-CM

## 2016-12-09 DIAGNOSIS — R05 Cough: Secondary | ICD-10-CM | POA: Diagnosis not present

## 2016-12-09 DIAGNOSIS — R739 Hyperglycemia, unspecified: Secondary | ICD-10-CM

## 2016-12-09 DIAGNOSIS — I10 Essential (primary) hypertension: Secondary | ICD-10-CM | POA: Insufficient documentation

## 2016-12-09 DIAGNOSIS — Z87891 Personal history of nicotine dependence: Secondary | ICD-10-CM | POA: Insufficient documentation

## 2016-12-09 DIAGNOSIS — Z7982 Long term (current) use of aspirin: Secondary | ICD-10-CM | POA: Diagnosis not present

## 2016-12-09 DIAGNOSIS — R531 Weakness: Secondary | ICD-10-CM | POA: Diagnosis present

## 2016-12-09 DIAGNOSIS — Z79899 Other long term (current) drug therapy: Secondary | ICD-10-CM | POA: Diagnosis not present

## 2016-12-09 DIAGNOSIS — R0602 Shortness of breath: Secondary | ICD-10-CM | POA: Diagnosis not present

## 2016-12-09 DIAGNOSIS — E1165 Type 2 diabetes mellitus with hyperglycemia: Principal | ICD-10-CM

## 2016-12-09 DIAGNOSIS — IMO0002 Reserved for concepts with insufficient information to code with codable children: Secondary | ICD-10-CM

## 2016-12-09 LAB — CBC
HCT: 37.5 % (ref 35.0–47.0)
HEMOGLOBIN: 12.5 g/dL (ref 12.0–16.0)
MCH: 28.2 pg (ref 26.0–34.0)
MCHC: 33.2 g/dL (ref 32.0–36.0)
MCV: 84.8 fL (ref 80.0–100.0)
PLATELETS: 168 10*3/uL (ref 150–440)
RBC: 4.42 MIL/uL (ref 3.80–5.20)
RDW: 13.9 % (ref 11.5–14.5)
WBC: 10.9 10*3/uL (ref 3.6–11.0)

## 2016-12-09 LAB — COMPREHENSIVE METABOLIC PANEL
ALBUMIN: 3.1 g/dL — AB (ref 3.5–5.0)
ALT: 220 U/L — ABNORMAL HIGH (ref 14–54)
ANION GAP: 10 (ref 5–15)
AST: 114 U/L — ABNORMAL HIGH (ref 15–41)
Alkaline Phosphatase: 89 U/L (ref 38–126)
BUN: 16 mg/dL (ref 6–20)
CHLORIDE: 93 mmol/L — AB (ref 101–111)
CO2: 31 mmol/L (ref 22–32)
Calcium: 9 mg/dL (ref 8.9–10.3)
Creatinine, Ser: 0.63 mg/dL (ref 0.44–1.00)
GFR calc non Af Amer: >60 mL/min (ref >60)
Glucose, Bld: 313 mg/dL — ABNORMAL HIGH (ref 65–99)
POTASSIUM: 4 mmol/L (ref 3.5–5.1)
SODIUM: 134 mmol/L — AB (ref 135–145)
Total Bilirubin: 1.8 mg/dL — ABNORMAL HIGH (ref 0.3–1.2)
Total Protein: 6.4 g/dL — ABNORMAL LOW (ref 6.5–8.1)

## 2016-12-09 LAB — URINALYSIS, COMPLETE (UACMP) WITH MICROSCOPIC
BACTERIA UA: NONE SEEN
Bilirubin Urine: NEGATIVE
Glucose, UA: >=500 mg/dL — AB
HGB URINE DIPSTICK: NEGATIVE
KETONES UR: 5 mg/dL — AB
NITRITE: NEGATIVE
PROTEIN: 100 mg/dL — AB
SPECIFIC GRAVITY, URINE: 1.024 (ref 1.005–1.030)
pH: 5 (ref 5.0–8.0)

## 2016-12-09 LAB — GLUCOSE, CAPILLARY: GLUCOSE-CAPILLARY: 312 mg/dL — AB (ref 65–99)

## 2016-12-09 MED ORDER — GLUCOSE BLOOD VI STRP: ORAL_STRIP | 3 refills | Status: DC

## 2016-12-09 MED ORDER — SODIUM CHLORIDE 0.9 % IV SOLN
1000.0000 mL | Freq: Once | INTRAVENOUS | Status: AC
  Administered 2016-12-09: 1000 mL via INTRAVENOUS

## 2016-12-09 NOTE — Telephone Encounter (Signed)
Freestyle test strips sent to [facility]

## 2016-12-09 NOTE — ED Notes (Signed)
Pt hollering that she needed help. Tech entered room and pt needed to go to bathroom. No call light on bed. Pt assisted to bedside toilet.

## 2016-12-09 NOTE — ED Triage Notes (Signed)
Pt to ED via POV for c/o hyperglycemia. Pt states that she has a hx/o Diabetes but has not taken medication for this, pt states that she has controlled it with her diet. Pt noticed last Saturday that she was feeling weak and she checked her sugar and her CBG was 410. Pt was seen at [facility] urgent care and told to start taking Januvia, Pt started taking this on Wednesday. Pt states that she still feels very weak.

## 2016-12-09 NOTE — Telephone Encounter (Signed)
Pt is requesting a refill on all her dm supplies asking that you send to [place]. Stated that she is currently at the ER

## 2016-12-09 NOTE — ED Provider Notes (Signed)
[facility] Emergency Department Provider Note   ____________________________________________    I have reviewed the triage vital signs and the nursing notes.   HISTORY  Chief Complaint Hyperglycemia     HPI Mackenzie Park is a 71 y.o. female presents with weakness and high blood sugar. Patient reports she went to urgent care 2 days ago because she found her glucose to be high. Recently she had been managing her sugars with weight loss. At urgent care they restarted her Januvia. Today she presents to the emergency department complaining of feeling "weak all over and not having any energy. She denies dizziness or syncope. No chest pain or palpitations. No abdominal pain. No dysuria. Mild cough which appears to be chronic.   Past Medical History:  Diagnosis Date  . Allergic rhinitis   . Depressive disorder   . Diabetes mellitus ([place])   . Hypercholesteremia   . Lumbosacral neuritis   . Osteoarthritis     Patient Active Problem List   Diagnosis Date Noted  . 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 Abnormal finding on thyroid function test 02/05/2015  . Breast screening 02/05/2015  . Cervical disc disease 02/05/2015  . Pain in shoulder 02/05/2015  . DDD (degenerative disc disease), lumbar 02/05/2015  . Dizziness 02/05/2015  . Neuropathy ([place]) 02/05/2015  . Extreme obesity ([place]) 02/05/2015  . Stasis, venous 02/05/2015  . Obesity 12/08/2014  . Diabetic neuropathy ([place]) 12/08/2014  . Hypertension 12/08/2014  . Hyperlipidemia 12/08/2014  . Infraspinatus tenosynovitis 09/18/2014  . Other synovitis and tenosynovitis, right shoulder 09/18/2014  . Allergic rhinitis, seasonal 01/09/2014  . Clinical depression 11/09/2009  . Osteoarthrosis 12/19/2008  . Diabetes mellitus, type 2 ([place]) 06/18/2007    Past Surgical History:  Procedure Laterality Date  . BARIATRIC SURGERY    . REPLACEMENT TOTAL KNEE BILATERAL      Prior to Admission  medications   Medication Sig Start Date End Date Taking? Authorizing Provider  aspirin EC 81 MG tablet Take 81 mg by mouth daily.   Yes Historical Provider, MD  fluticasone (FLONASE) 50 MCG/ACT nasal spray Place 1-2 sprays into both nostrils daily.   Yes Historical Provider, MD  gabapentin (NEURONTIN) 300 MG capsule Take 1 capsule (300 mg total) by mouth at bedtime. 11/08/16  Yes Roselee Nova, MD  lisinopril-hydrochlorothiazide (PRINZIDE,ZESTORETIC) 20-12.5 MG tablet Take 1 tablet by mouth daily. 11/08/16  Yes Roselee Nova, MD  meloxicam (MOBIC) 15 MG tablet Take 1 tablet (15 mg total) by mouth daily. 11/08/16  Yes Roselee Nova, MD  sitaGLIPtin (JANUVIA) 100 MG tablet Take 1 tablet (100 mg total) by mouth daily. 11/16/16  Yes Roselee Nova, MD  atorvastatin (LIPITOR) 20 MG tablet Take 1 tablet (20 mg total) by mouth daily. Patient not taking: Reported on 12/09/2016 11/08/16   Roselee Nova, MD  benzonatate (TESSALON PERLES) 100 MG capsule Take 1 capsule (100 mg total) by mouth 3 (three) times daily as needed for cough. Patient not taking: Reported on 12/09/2016 11/08/16   Roselee Nova, MD  glucose blood (FREESTYLE TEST STRIPS) test strip Use as instructed 05/14/15   Ashok Norris, MD     Allergies Sulfa antibiotics  Family History  Problem Relation Age of Onset  . Dementia Father   . COPD Sister   . Cancer Sister   . Cancer Mother     Social History Social History  Substance Use Topics  . Smoking status:  Former Smoker    Quit date: 08/20/1995  . Smokeless tobacco: Never Used  . Alcohol use No    Review of Systems  Constitutional: No fever/chills Eyes: No visual changes.   Cardiovascular: Denies chest pain. Respiratory: Denies shortness of breath. Gastrointestinal: No nausea, no vomiting.   Genitourinary: Negative for dysuria. Musculoskeletal: Negative for back pain. Skin: Negative for rash. Neurological: Negative for headaches Or focal weakness  10-point ROS  otherwise negative.  ____________________________________________   PHYSICAL EXAM:  VITAL SIGNS: ED Triage Vitals  Enc Vitals Group     BP 12/09/16 0711 (!) 158/77     Pulse Rate 12/09/16 0711 85     Resp 12/09/16 0711 16     Temp 12/09/16 0711 98.7 F (37.1 C)     Temp Source 12/09/16 0711 Oral     SpO2 12/09/16 0711 99 %     Weight 12/09/16 0707 245 lb (111.1 kg)     Height --      Head Circumference --      Peak Flow --      Pain Score 12/09/16 0707 2     Pain Loc --      Pain Edu? --      Excl. in [place]? --     Constitutional: Alert and oriented. No acute distress. Pleasant and interactive Eyes: Conjunctivae are normal.   Nose: No congestion/rhinnorhea. Mouth/Throat: Mucous membranes are moist.    Cardiovascular: Normal rate, regular rhythm. Grossly normal heart sounds.  Good peripheral circulation. Respiratory: Normal respiratory effort.  No retractions. Lungs CTAB. Gastrointestinal: Soft and nontender. No distention.  No CVA tenderness. Genitourinary: deferred Musculoskeletal: No lower extremity tenderness nor edema.  Warm and well perfused Neurologic:  Normal speech and language. No gross focal neurologic deficits are appreciated. Patient reports chronic right eye drooping from prior Bell's palsy Skin:  Skin is warm, dry and intact. No rash noted. Psychiatric: Mood and affect are normal. Speech and behavior are normal.  ____________________________________________   LABS (all labs ordered are listed, but only abnormal results are displayed)  Labs Reviewed  GLUCOSE, CAPILLARY - Abnormal; Notable for the following:       Result Value   Glucose-Capillary 312 (*)    All other components within normal limits  COMPREHENSIVE METABOLIC PANEL - Abnormal; Notable for the following:    Sodium 134 (*)    Chloride 93 (*)    Glucose, Bld 313 (*)    Total Protein 6.4 (*)    Albumin 3.1 (*)    AST 114 (*)    ALT 220 (*)    Total Bilirubin 1.8 (*)    All other  components within normal limits  URINALYSIS, COMPLETE (UACMP) WITH MICROSCOPIC - Abnormal; Notable for the following:    Color, Urine AMBER (*)    APPearance HAZY (*)    Glucose, UA >=500 (*)    Ketones, ur 5 (*)    Protein, ur 100 (*)    Leukocytes, UA SMALL (*)    Squamous Epithelial / LPF 0-5 (*)    All other components within normal limits  CBC   ____________________________________________  EKG  ED ECG REPORT I, Lavonia Drafts, the attending physician, personally viewed and interpreted this ECG.  Date: 12/09/2016 EKG Time: 7:30 AM Rate: 76 Rhythm: normal sinus rhythm QRS Axis: normal Intervals: normal ST/T Wave abnormalities: normal Conduction Disturbances: none Narrative Interpretation: unremarkable  ____________________________________________  RADIOLOGY  Atelectasis on chest x-ray ____________________________________________   PROCEDURES  Procedure(s) performed: No  Critical Care performed: No ____________________________________________   INITIAL IMPRESSION / ASSESSMENT AND PLAN / ED COURSE  Pertinent labs & imaging results that were available during my care of the patient were reviewed by me and considered in my medical decision making (see chart for details).  Patient overall well-appearing and in no acute distress. Exam is reassuring. We will check labs, urine, chest x-ray give IV fluids and reevaluate.    ----------------------------------------- 12:25 PM on 12/09/2016 -----------------------------------------  Patient sitting on the edge of the bed, quite impatient to leave. She reports she feels better after fluids. Discussed elevated LFTs with her, she says her PCP knows about this and she has further lab work schedule. She will continue her Januvia and follow-up with PCP regarding her diabetes   ____________________________________________   FINAL CLINICAL IMPRESSION(S) / ED DIAGNOSES  Final diagnoses:  Hyperglycemia      NEW  MEDICATIONS STARTED DURING THIS VISIT:  Discharge Medication List as of 12/09/2016 11:58 AM       Note:  This document was prepared using Dragon voice recognition software and may include unintentional dictation errors.    Lavonia Drafts, MD 12/09/16 1225

## 2016-12-11 ENCOUNTER — Encounter (HOSPITAL_COMMUNITY): Payer: Self-pay | Admitting: Emergency Medicine

## 2016-12-11 ENCOUNTER — Inpatient Hospital Stay (HOSPITAL_COMMUNITY)
Admission: EM | Admit: 2016-12-11 | Discharge: 2016-12-19 | DRG: 292 | Disposition: A | Discharge status: Home / Self Care | Payer: Medicare HMO | Attending: Internal Medicine | Admitting: Internal Medicine

## 2016-12-11 DIAGNOSIS — I509 Heart failure, unspecified: Secondary | ICD-10-CM

## 2016-12-11 DIAGNOSIS — Z809 Family history of malignant neoplasm, unspecified: Secondary | ICD-10-CM

## 2016-12-11 DIAGNOSIS — I959 Hypotension, unspecified: Secondary | ICD-10-CM | POA: Diagnosis present

## 2016-12-11 DIAGNOSIS — C787 Secondary malignant neoplasm of liver and intrahepatic bile duct: Secondary | ICD-10-CM | POA: Diagnosis not present

## 2016-12-11 DIAGNOSIS — Z96653 Presence of artificial knee joint, bilateral: Secondary | ICD-10-CM | POA: Diagnosis present

## 2016-12-11 DIAGNOSIS — E118 Type 2 diabetes mellitus with unspecified complications: Secondary | ICD-10-CM | POA: Diagnosis not present

## 2016-12-11 DIAGNOSIS — K761 Chronic passive congestion of liver: Secondary | ICD-10-CM | POA: Diagnosis present

## 2016-12-11 DIAGNOSIS — R945 Abnormal results of liver function studies: Secondary | ICD-10-CM | POA: Diagnosis not present

## 2016-12-11 DIAGNOSIS — R740 Nonspecific elevation of levels of transaminase and lactic acid dehydrogenase [LDH]: Secondary | ICD-10-CM | POA: Diagnosis present

## 2016-12-11 DIAGNOSIS — Z9884 Bariatric surgery status: Secondary | ICD-10-CM | POA: Diagnosis not present

## 2016-12-11 DIAGNOSIS — R7989 Other specified abnormal findings of blood chemistry: Secondary | ICD-10-CM | POA: Diagnosis not present

## 2016-12-11 DIAGNOSIS — R7401 Elevation of levels of liver transaminase levels: Secondary | ICD-10-CM

## 2016-12-11 DIAGNOSIS — Z86010 Personal history of colonic polyps: Secondary | ICD-10-CM

## 2016-12-11 DIAGNOSIS — R531 Weakness: Secondary | ICD-10-CM

## 2016-12-11 DIAGNOSIS — E1165 Type 2 diabetes mellitus with hyperglycemia: Secondary | ICD-10-CM | POA: Diagnosis present

## 2016-12-11 DIAGNOSIS — I10 Essential (primary) hypertension: Secondary | ICD-10-CM | POA: Diagnosis not present

## 2016-12-11 DIAGNOSIS — I110 Hypertensive heart disease with heart failure: Secondary | ICD-10-CM | POA: Diagnosis not present

## 2016-12-11 DIAGNOSIS — E669 Obesity, unspecified: Secondary | ICD-10-CM | POA: Diagnosis present

## 2016-12-11 DIAGNOSIS — C7971 Secondary malignant neoplasm of right adrenal gland: Secondary | ICD-10-CM | POA: Diagnosis not present

## 2016-12-11 DIAGNOSIS — F329 Major depressive disorder, single episode, unspecified: Secondary | ICD-10-CM | POA: Diagnosis present

## 2016-12-11 DIAGNOSIS — Z79899 Other long term (current) drug therapy: Secondary | ICD-10-CM

## 2016-12-11 DIAGNOSIS — Z7984 Long term (current) use of oral hypoglycemic drugs: Secondary | ICD-10-CM

## 2016-12-11 DIAGNOSIS — E7800 Pure hypercholesterolemia, unspecified: Secondary | ICD-10-CM | POA: Diagnosis present

## 2016-12-11 DIAGNOSIS — R739 Hyperglycemia, unspecified: Secondary | ICD-10-CM | POA: Diagnosis not present

## 2016-12-11 DIAGNOSIS — IMO0002 Reserved for concepts with insufficient information to code with codable children: Secondary | ICD-10-CM

## 2016-12-11 DIAGNOSIS — J309 Allergic rhinitis, unspecified: Secondary | ICD-10-CM | POA: Diagnosis present

## 2016-12-11 DIAGNOSIS — Z6839 Body mass index (BMI) 39.0-39.9, adult: Secondary | ICD-10-CM | POA: Diagnosis not present

## 2016-12-11 DIAGNOSIS — R1010 Upper abdominal pain, unspecified: Secondary | ICD-10-CM | POA: Diagnosis not present

## 2016-12-11 DIAGNOSIS — R339 Retention of urine, unspecified: Secondary | ICD-10-CM | POA: Diagnosis present

## 2016-12-11 DIAGNOSIS — R160 Hepatomegaly, not elsewhere classified: Secondary | ICD-10-CM | POA: Diagnosis not present

## 2016-12-11 DIAGNOSIS — Z87891 Personal history of nicotine dependence: Secondary | ICD-10-CM | POA: Diagnosis not present

## 2016-12-11 DIAGNOSIS — K7689 Other specified diseases of liver: Secondary | ICD-10-CM | POA: Diagnosis not present

## 2016-12-11 DIAGNOSIS — C342 Malignant neoplasm of middle lobe, bronchus or lung: Secondary | ICD-10-CM | POA: Diagnosis present

## 2016-12-11 DIAGNOSIS — R600 Localized edema: Secondary | ICD-10-CM

## 2016-12-11 DIAGNOSIS — E785 Hyperlipidemia, unspecified: Secondary | ICD-10-CM | POA: Diagnosis present

## 2016-12-11 DIAGNOSIS — Z791 Long term (current) use of non-steroidal anti-inflammatories (NSAID): Secondary | ICD-10-CM

## 2016-12-11 DIAGNOSIS — E876 Hypokalemia: Secondary | ICD-10-CM | POA: Diagnosis present

## 2016-12-11 DIAGNOSIS — Z825 Family history of asthma and other chronic lower respiratory diseases: Secondary | ICD-10-CM

## 2016-12-11 DIAGNOSIS — Z7982 Long term (current) use of aspirin: Secondary | ICD-10-CM

## 2016-12-11 DIAGNOSIS — I5033 Acute on chronic diastolic (congestive) heart failure: Secondary | ICD-10-CM | POA: Diagnosis not present

## 2016-12-11 DIAGNOSIS — C771 Secondary and unspecified malignant neoplasm of intrathoracic lymph nodes: Secondary | ICD-10-CM | POA: Diagnosis not present

## 2016-12-11 DIAGNOSIS — R918 Other nonspecific abnormal finding of lung field: Secondary | ICD-10-CM | POA: Diagnosis not present

## 2016-12-11 DIAGNOSIS — R0600 Dyspnea, unspecified: Secondary | ICD-10-CM | POA: Diagnosis not present

## 2016-12-11 DIAGNOSIS — Z882 Allergy status to sulfonamides status: Secondary | ICD-10-CM

## 2016-12-11 DIAGNOSIS — K769 Liver disease, unspecified: Secondary | ICD-10-CM

## 2016-12-11 DIAGNOSIS — C801 Malignant (primary) neoplasm, unspecified: Secondary | ICD-10-CM | POA: Diagnosis not present

## 2016-12-11 DIAGNOSIS — D649 Anemia, unspecified: Secondary | ICD-10-CM | POA: Diagnosis present

## 2016-12-11 DIAGNOSIS — R609 Edema, unspecified: Secondary | ICD-10-CM | POA: Diagnosis not present

## 2016-12-11 DIAGNOSIS — Z794 Long term (current) use of insulin: Secondary | ICD-10-CM | POA: Diagnosis not present

## 2016-12-11 HISTORY — DX: Heart failure, unspecified: I50.9

## 2016-12-11 HISTORY — DX: Essential (primary) hypertension: I10

## 2016-12-11 LAB — CBG MONITORING, ED: Glucose-Capillary: 471 mg/dL — ABNORMAL HIGH (ref 65–99)

## 2016-12-11 LAB — CBC
HEMATOCRIT: 36.9 % (ref 36.0–46.0)
HEMOGLOBIN: 11.8 g/dL — AB (ref 12.0–15.0)
MCH: 27.1 pg (ref 26.0–34.0)
MCHC: 32 g/dL (ref 30.0–36.0)
MCV: 84.8 fL (ref 78.0–100.0)
Platelets: 221 10*3/uL (ref 150–400)
RBC: 4.35 MIL/uL (ref 3.87–5.11)
RDW: 14.1 % (ref 11.5–15.5)
WBC: 11.5 10*3/uL — ABNORMAL HIGH (ref 4.0–10.5)

## 2016-12-11 LAB — BASIC METABOLIC PANEL
ANION GAP: 11 (ref 5–15)
BUN: 19 mg/dL (ref 6–20)
CHLORIDE: 93 mmol/L — AB (ref 101–111)
CO2: 29 mmol/L (ref 22–32)
Calcium: 9 mg/dL (ref 8.9–10.3)
Creatinine, Ser: 0.9 mg/dL (ref 0.44–1.00)
GFR calc Af Amer: >60 mL/min (ref >60)
GLUCOSE: 492 mg/dL — AB (ref 65–99)
POTASSIUM: 3.7 mmol/L (ref 3.5–5.1)
Sodium: 133 mmol/L — ABNORMAL LOW (ref 135–145)

## 2016-12-11 LAB — URINALYSIS, ROUTINE W REFLEX MICROSCOPIC
BILIRUBIN URINE: NEGATIVE
Glucose, UA: >=500 mg/dL — AB
Hgb urine dipstick: NEGATIVE
Ketones, ur: NEGATIVE mg/dL
Leukocytes, UA: NEGATIVE
NITRITE: NEGATIVE
PH: 5.5 (ref 5.0–8.0)
Protein, ur: NEGATIVE mg/dL

## 2016-12-11 LAB — URINALYSIS, MICROSCOPIC (REFLEX)

## 2016-12-11 MED ORDER — SODIUM CHLORIDE 0.9 % IV BOLUS (SEPSIS)
1000.0000 mL | Freq: Once | INTRAVENOUS | Status: AC
  Administered 2016-12-12: 1000 mL via INTRAVENOUS

## 2016-12-11 NOTE — ED Triage Notes (Signed)
Pt c/o high blood sugar noted about 0430 today. Pt reports meter at home read high. Pt also reports generalized weakness x 1 week and urinary symptoms of dark urine and decreased urine output.

## 2016-12-11 NOTE — ED Provider Notes (Signed)
[facility] DEPT Provider Note   CSN: [id number] Arrival date & time: 12/11/16  1737   By signing my name below, I, Delton Prairie, attest that this documentation has been prepared under the direction and in the presence of Merryl Hacker, MD  Electronically Signed: Delton Prairie, ED Scribe. 12/11/16. 11:49 PM.   History   Chief Complaint Chief Complaint  Patient presents with  . Hyperglycemia  . Weakness  . Urinary Retention    HPI Comments:  Mackenzie Park is a 71 y.o. female, with a PMHx of DM and hypercholesteremia, who presents to the Emergency Department complaining of persistent, generalized weakness x 1 week. She reports elevated blood sugar which was 428 in the AM today. Pt also reports abdominal discomfort, leg swelling, frequency, a cough, nausea and notes her urine has been darker. She notes her PCP, Dr. Manuella Ghazi, started her on Januvia for her DM. Pt states she took 2 doses and stopped taking this medication because it made her feel bad but notes she recently restarted this medication. Pt states she has been to [facility] (on 12/09/16) and to [facility] (on 12/07/16) but states that no one has been able to help her. No alleviating factors noted. Pt denies diarrhea or any other associated symptoms. She also denies a hx of heart disease. No other complaints noted at this time.    I have reviewed patient's chart. She was given IV fluids at [facility]. Documentation reports that she felt better however, patient disputes this. Recommended continuing Januvia and follow-up with her primary physician.  The history is provided by the patient. No language interpreter was used.    Past Medical History:  Diagnosis Date  . Allergic rhinitis   . Depressive disorder   . Diabetes mellitus ([place])   . Hypercholesteremia   . Lumbosacral neuritis   . Osteoarthritis     Patient Active Problem List   Diagnosis Date Noted  . 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 Abnormal finding on thyroid function test 02/05/2015  . Breast screening 02/05/2015  . Cervical disc disease 02/05/2015  . Pain in shoulder 02/05/2015  . DDD (degenerative disc disease), lumbar 02/05/2015  . Dizziness 02/05/2015  . Neuropathy ([place]) 02/05/2015  . Extreme obesity ([place]) 02/05/2015  . Stasis, venous 02/05/2015  . Obesity 12/08/2014  . Diabetic neuropathy ([place]) 12/08/2014  . Hypertension 12/08/2014  . Hyperlipidemia 12/08/2014  . Infraspinatus tenosynovitis 09/18/2014  . Other synovitis and tenosynovitis, right shoulder 09/18/2014  . Allergic rhinitis, seasonal 01/09/2014  . Clinical depression 11/09/2009  . Osteoarthrosis 12/19/2008  . Diabetes mellitus, type 2 ([place]) 06/18/2007    Past Surgical History:  Procedure Laterality Date  . BARIATRIC SURGERY    . REPLACEMENT TOTAL KNEE BILATERAL      OB History    No data available       Home Medications    Prior to Admission medications   Medication Sig Start Date End Date Taking? Authorizing Provider  aspirin EC 81 MG tablet Take 81 mg by mouth daily.   Yes Historical Provider, MD  fluticasone (FLONASE) 50 MCG/ACT nasal spray Place 1-2 sprays into both nostrils daily as needed for allergies.    Yes Historical Provider, MD  gabapentin (NEURONTIN) 300 MG capsule Take 1 capsule (300 mg total) by mouth at bedtime. 11/08/16  Yes Roselee Nova, MD  lisinopril-hydrochlorothiazide (PRINZIDE,ZESTORETIC) 20-12.5 MG tablet Take 1 tablet by mouth daily. 11/08/16  Yes [place],  MD  meloxicam (MOBIC) 15 MG tablet Take 1 tablet (15 mg total) by mouth daily. 11/08/16  Yes Roselee Nova, MD  sitaGLIPtin (JANUVIA) 100 MG tablet Take 1 tablet (100 mg total) by mouth daily. 11/16/16  Yes Roselee Nova, MD  atorvastatin (LIPITOR) 20 MG tablet Take 1 tablet (20 mg total) by mouth daily. Patient not taking: Reported on 12/09/2016 11/08/16   Roselee Nova, MD  benzonatate (TESSALON PERLES) 100 MG capsule  Take 1 capsule (100 mg total) by mouth 3 (three) times daily as needed for cough. Patient not taking: Reported on 12/09/2016 11/08/16   Roselee Nova, MD  glucose blood (FREESTYLE TEST STRIPS) test strip Use as instructed 12/09/16   Roselee Nova, MD    Family History Family History  Problem Relation Age of Onset  . Dementia Father   . COPD Sister   . Cancer Sister   . Cancer Mother     Social History Social History  Substance Use Topics  . Smoking status: Former Smoker    Quit date: 08/20/1995  . Smokeless tobacco: Never Used  . Alcohol use No     Allergies   Sulfa antibiotics   Review of Systems Review of Systems  Respiratory: Positive for cough.   Gastrointestinal: Positive for abdominal pain and nausea. Negative for diarrhea.  Genitourinary: Positive for frequency.  Neurological: Positive for weakness.  All other systems reviewed and are negative.    Physical Exam Updated Vital Signs BP 134/60   Pulse 63   Temp 97.5 F (36.4 C) (Oral)   Resp 18   Ht '5\' 6"'$  (1.676 m)   Wt 245 lb (111.1 kg)   SpO2 98%   BMI 39.54 kg/m   Physical Exam  Constitutional: She is oriented to person, place, and time.  No acute distress, overweight  HENT:  Head: Normocephalic and atraumatic.  Mucous membranes dry  Eyes: Pupils are equal, round, and reactive to light.  Cardiovascular: Normal rate, regular rhythm and normal heart sounds.   No murmur heard. Pulmonary/Chest: Effort normal and breath sounds normal. No respiratory distress. She has no wheezes.  Abdominal: Soft. Bowel sounds are normal. There is no tenderness. There is no rebound and no guarding.  Musculoskeletal: She exhibits edema.  1+ bilateral pitting edema  Neurological: She is alert and oriented to person, place, and time.  With speech, cranial nerves II through XII intact, 5 out of 5 strength in all 4 extremities  Skin: Skin is warm and dry.  Psychiatric: She has a normal mood and affect.  Nursing note and  vitals reviewed.    ED Treatments / Results  DIAGNOSTIC STUDIES:  Oxygen Saturation is 98% on RA, normal by my interpretation.    COORDINATION OF CARE:  11:43 PM Discussed treatment plan with pt at bedside and pt agreed to plan.  Labs (all labs ordered are listed, but only abnormal results are displayed) Labs Reviewed  BASIC METABOLIC PANEL - Abnormal; Notable for the following:       Result Value   Sodium 133 (*)    Chloride 93 (*)    Glucose, Bld 492 (*)    All other components within normal limits  CBC - Abnormal; Notable for the following:    WBC 11.5 (*)    Hemoglobin 11.8 (*)    All other components within normal limits  URINALYSIS, ROUTINE W REFLEX MICROSCOPIC - Abnormal; Notable for the following:    Specific Gravity, Urine <  1.005 (*)    Glucose, UA >=500 (*)    All other components within normal limits  URINALYSIS, MICROSCOPIC (REFLEX) - Abnormal; Notable for the following:    Bacteria, UA RARE (*)    Squamous Epithelial / LPF 6-30 (*)    All other components within normal limits  HEPATIC FUNCTION PANEL - Abnormal; Notable for the following:    Total Protein 6.4 (*)    Albumin 3.1 (*)    AST 92 (*)    ALT 203 (*)    Total Bilirubin 1.4 (*)    Indirect Bilirubin 1.1 (*)    All other components within normal limits  LIPASE, BLOOD - Abnormal; Notable for the following:    Lipase 56 (*)    All other components within normal limits  CBG MONITORING, ED - Abnormal; Notable for the following:    Glucose-Capillary 471 (*)    All other components within normal limits  CBG MONITORING, ED - Abnormal; Notable for the following:    Glucose-Capillary 370 (*)    All other components within normal limits  CBG MONITORING, ED - Abnormal; Notable for the following:    Glucose-Capillary 379 (*)    All other components within normal limits  URINE CULTURE    EKG  EKG Interpretation None       Radiology Dg Abdomen Acute W/chest  Result Date: 12/12/2016 CLINICAL  DATA:  Cough, upper abdominal pain EXAM: DG ABDOMEN ACUTE W/ 1V CHEST COMPARISON:  12/09/2016 and 05/11/2015 CXR FINDINGS: There is no evidence of dilated bowel loops or free intraperitoneal air. Surgical clips are seen in the upper abdomen bilaterally in the right upper quadrant and epigastric region as well as left lower quadrant. No radiopaque calculi. Lumbar degenerative changes are seen. Mild joint space narrowing of both hips. No acute osseous appearing abnormality. Heart size and mediastinal contours are within normal limits. Ovoid density along the minor fissure is noted which may reflect a pseudo lesion with trace fluid or atelectasis along side or within the fissure. IMPRESSION: Negative abdominal radiographs. No acute cardiopulmonary disease. Fluid or atelectasis along side the minor fissure as on recent comparison study. Electronically Signed   By: Ashley Royalty M.D.   On: 12/12/2016 00:39    Procedures Procedures (including critical care time)  Medications Ordered in ED Medications  insulin aspart (novoLOG) injection 10 Units (not administered)  sodium chloride 0.9 % bolus 1,000 mL (1,000 mLs Intravenous New Bag/Given 12/12/16 0103)     Initial Impression / Assessment and Plan / ED Course  I have reviewed the triage vital signs and the nursing notes.  Pertinent labs & imaging results that were available during my care of the patient were reviewed by me and considered in my medical decision making (see chart for details).  Clinical Course as of Dec 12 298  Mon Dec 12, 2016  0246 Patient states that she feels a little bit better after fluids. Repeat blood sugar 379. Persistent elevated LFTs. Given persistent hyperglycemia and multiple visits over the last 24 hours, feel she likely needs admission for initiation of insulin. She also states that she feels somewhat short of breath now. She does have 1+ pitting edema in the lower extremity is and is at risk for volume overload. DG Abdomen  Acute W/Chest [CH]    Clinical Course User Index [CH] Merryl Hacker, MD    Patient presents with hyperglycemia and weakness. Has had 2 evaluations in the last several days. She states that she does not  feel any better. She has had persistent hyperglycemia. She did reinitiate Januvia.  She is nontoxic-appearing. She does appear mildly volume overloaded. Acute abdominal series and LFTs added to her workup. Patient was given 1 L of fluids. Will reassess. LFTs are elevated. Acute abdominal series shows fluid in the fissure. Feel this is likely related to volume overload and not pneumonia given patient's clinical symptoms. On recheck, she states that she feels somewhat better but she now feels short of breath. Repeat blood sugar not improved. Patient was given 10 units of IV insulin. Feel she likely needs admission at this time for gentle hydration and initiation of insulin. She is at risk for pulmonary edema given her age and volume status at this time.    Final Clinical Impressions(s) / ED Diagnoses   Final diagnoses:  Weakness  Hyperglycemia  Transaminitis  Lower extremity edema    New Prescriptions New Prescriptions   No medications on file   I personally performed the services described in this documentation, which was scribed in my presence. The recorded information has been reviewed and is accurate.    Merryl Hacker, MD 12/12/16 [phone]

## 2016-12-12 ENCOUNTER — Emergency Department (HOSPITAL_COMMUNITY): Payer: Medicare HMO

## 2016-12-12 ENCOUNTER — Encounter (HOSPITAL_COMMUNITY): Payer: Self-pay

## 2016-12-12 ENCOUNTER — Inpatient Hospital Stay (HOSPITAL_COMMUNITY): Payer: Medicare HMO

## 2016-12-12 DIAGNOSIS — K7689 Other specified diseases of liver: Secondary | ICD-10-CM | POA: Diagnosis not present

## 2016-12-12 DIAGNOSIS — I110 Hypertensive heart disease with heart failure: Secondary | ICD-10-CM | POA: Diagnosis present

## 2016-12-12 DIAGNOSIS — R739 Hyperglycemia, unspecified: Secondary | ICD-10-CM | POA: Diagnosis not present

## 2016-12-12 DIAGNOSIS — R339 Retention of urine, unspecified: Secondary | ICD-10-CM | POA: Diagnosis present

## 2016-12-12 DIAGNOSIS — Z96653 Presence of artificial knee joint, bilateral: Secondary | ICD-10-CM | POA: Diagnosis present

## 2016-12-12 DIAGNOSIS — R740 Nonspecific elevation of levels of transaminase and lactic acid dehydrogenase [LDH]: Secondary | ICD-10-CM | POA: Diagnosis present

## 2016-12-12 DIAGNOSIS — E1165 Type 2 diabetes mellitus with hyperglycemia: Secondary | ICD-10-CM | POA: Diagnosis present

## 2016-12-12 DIAGNOSIS — Z9884 Bariatric surgery status: Secondary | ICD-10-CM | POA: Diagnosis not present

## 2016-12-12 DIAGNOSIS — K761 Chronic passive congestion of liver: Secondary | ICD-10-CM | POA: Diagnosis present

## 2016-12-12 DIAGNOSIS — E785 Hyperlipidemia, unspecified: Secondary | ICD-10-CM | POA: Diagnosis present

## 2016-12-12 DIAGNOSIS — R7401 Elevation of levels of liver transaminase levels: Secondary | ICD-10-CM | POA: Insufficient documentation

## 2016-12-12 DIAGNOSIS — R945 Abnormal results of liver function studies: Secondary | ICD-10-CM | POA: Insufficient documentation

## 2016-12-12 DIAGNOSIS — Z794 Long term (current) use of insulin: Secondary | ICD-10-CM | POA: Diagnosis not present

## 2016-12-12 DIAGNOSIS — E876 Hypokalemia: Secondary | ICD-10-CM | POA: Diagnosis present

## 2016-12-12 DIAGNOSIS — I509 Heart failure, unspecified: Secondary | ICD-10-CM | POA: Diagnosis not present

## 2016-12-12 DIAGNOSIS — J309 Allergic rhinitis, unspecified: Secondary | ICD-10-CM | POA: Diagnosis present

## 2016-12-12 DIAGNOSIS — F329 Major depressive disorder, single episode, unspecified: Secondary | ICD-10-CM | POA: Diagnosis present

## 2016-12-12 DIAGNOSIS — C787 Secondary malignant neoplasm of liver and intrahepatic bile duct: Secondary | ICD-10-CM | POA: Diagnosis present

## 2016-12-12 DIAGNOSIS — E7800 Pure hypercholesterolemia, unspecified: Secondary | ICD-10-CM | POA: Diagnosis present

## 2016-12-12 DIAGNOSIS — I959 Hypotension, unspecified: Secondary | ICD-10-CM | POA: Diagnosis present

## 2016-12-12 DIAGNOSIS — Z86010 Personal history of colonic polyps: Secondary | ICD-10-CM | POA: Diagnosis not present

## 2016-12-12 DIAGNOSIS — D649 Anemia, unspecified: Secondary | ICD-10-CM | POA: Diagnosis present

## 2016-12-12 DIAGNOSIS — Z87891 Personal history of nicotine dependence: Secondary | ICD-10-CM | POA: Diagnosis not present

## 2016-12-12 DIAGNOSIS — I5033 Acute on chronic diastolic (congestive) heart failure: Secondary | ICD-10-CM | POA: Diagnosis present

## 2016-12-12 DIAGNOSIS — R7989 Other specified abnormal findings of blood chemistry: Secondary | ICD-10-CM | POA: Diagnosis present

## 2016-12-12 DIAGNOSIS — E669 Obesity, unspecified: Secondary | ICD-10-CM | POA: Diagnosis present

## 2016-12-12 DIAGNOSIS — R531 Weakness: Secondary | ICD-10-CM | POA: Diagnosis present

## 2016-12-12 DIAGNOSIS — R609 Edema, unspecified: Secondary | ICD-10-CM | POA: Diagnosis not present

## 2016-12-12 DIAGNOSIS — C771 Secondary and unspecified malignant neoplasm of intrathoracic lymph nodes: Secondary | ICD-10-CM | POA: Diagnosis present

## 2016-12-12 DIAGNOSIS — R600 Localized edema: Secondary | ICD-10-CM | POA: Diagnosis not present

## 2016-12-12 DIAGNOSIS — C342 Malignant neoplasm of middle lobe, bronchus or lung: Secondary | ICD-10-CM | POA: Diagnosis present

## 2016-12-12 DIAGNOSIS — E118 Type 2 diabetes mellitus with unspecified complications: Secondary | ICD-10-CM | POA: Diagnosis not present

## 2016-12-12 DIAGNOSIS — R160 Hepatomegaly, not elsewhere classified: Secondary | ICD-10-CM | POA: Diagnosis not present

## 2016-12-12 DIAGNOSIS — I10 Essential (primary) hypertension: Secondary | ICD-10-CM | POA: Diagnosis not present

## 2016-12-12 DIAGNOSIS — C7971 Secondary malignant neoplasm of right adrenal gland: Secondary | ICD-10-CM | POA: Diagnosis present

## 2016-12-12 DIAGNOSIS — Z6839 Body mass index (BMI) 39.0-39.9, adult: Secondary | ICD-10-CM | POA: Diagnosis not present

## 2016-12-12 LAB — CBG MONITORING, ED
GLUCOSE-CAPILLARY: 283 mg/dL — AB (ref 65–99)
Glucose-Capillary: 370 mg/dL — ABNORMAL HIGH (ref 65–99)
Glucose-Capillary: 379 mg/dL — ABNORMAL HIGH (ref 65–99)

## 2016-12-12 LAB — HEPATIC FUNCTION PANEL
ALT: 203 U/L — ABNORMAL HIGH (ref 14–54)
AST: 92 U/L — ABNORMAL HIGH (ref 15–41)
Albumin: 3.1 g/dL — ABNORMAL LOW (ref 3.5–5.0)
Alkaline Phosphatase: 94 U/L (ref 38–126)
Bilirubin, Direct: 0.3 mg/dL (ref 0.1–0.5)
Indirect Bilirubin: 1.1 mg/dL — ABNORMAL HIGH (ref 0.3–0.9)
Total Bilirubin: 1.4 mg/dL — ABNORMAL HIGH (ref 0.3–1.2)
Total Protein: 6.4 g/dL — ABNORMAL LOW (ref 6.5–8.1)

## 2016-12-12 LAB — GLUCOSE, CAPILLARY
Glucose-Capillary: 338 mg/dL — ABNORMAL HIGH (ref 65–99)
Glucose-Capillary: 308 mg/dL — ABNORMAL HIGH (ref 65–99)
Glucose-Capillary: 343 mg/dL — ABNORMAL HIGH (ref 65–99)
Glucose-Capillary: 385 mg/dL — ABNORMAL HIGH (ref 65–99)

## 2016-12-12 LAB — TROPONIN I
Troponin I: 0.03 ng/mL (ref <0.03)
Troponin I: <0.03 ng/mL (ref <0.03)
Troponin I: <0.03 ng/mL (ref <0.03)

## 2016-12-12 LAB — ACETAMINOPHEN LEVEL: Acetaminophen (Tylenol), Serum: <10 ug/mL — ABNORMAL LOW (ref 10–30)

## 2016-12-12 LAB — MAGNESIUM: Magnesium: 1.9 mg/dL (ref 1.7–2.4)

## 2016-12-12 LAB — LIPASE, BLOOD: Lipase: 56 U/L — ABNORMAL HIGH (ref 11–51)

## 2016-12-12 LAB — BRAIN NATRIURETIC PEPTIDE: B Natriuretic Peptide: 62.2 pg/mL (ref 0.0–100.0)

## 2016-12-12 MED ORDER — SODIUM CHLORIDE 0.9% FLUSH: 3.0000 mL | INTRAVENOUS | Status: DC | PRN

## 2016-12-12 MED ORDER — FLUTICASONE PROPIONATE 50 MCG/ACT NA SUSP: 1.0000 | Freq: Every day | NASAL | Status: DC | PRN

## 2016-12-12 MED ORDER — PREMIER PROTEIN SHAKE
11.0000 [oz_av] | ORAL | Status: DC
  Administered 2016-12-13 – 2016-12-17 (×3): 11 [oz_av] via ORAL
  Filled 2016-12-12 (×11): qty 325.31

## 2016-12-12 MED ORDER — ENOXAPARIN SODIUM 40 MG/0.4ML ~~LOC~~ SOLN
40.0000 mg | SUBCUTANEOUS | Status: AC
  Administered 2016-12-12 – 2016-12-17 (×6): 40 mg via SUBCUTANEOUS
  Filled 2016-12-12 (×6): qty 0.4

## 2016-12-12 MED ORDER — DM-GUAIFENESIN ER 30-600 MG PO TB12
1.0000 | ORAL_TABLET | Freq: Two times a day (BID) | ORAL | Status: DC | PRN
  Administered 2016-12-12: 1 via ORAL
  Filled 2016-12-12: qty 1

## 2016-12-12 MED ORDER — GLUCERNA SHAKE PO LIQD
237.0000 mL | ORAL | Status: DC
  Administered 2016-12-13 – 2016-12-18 (×6): 237 mL via ORAL

## 2016-12-12 MED ORDER — NEPRO/CARBSTEADY PO LIQD
237.0000 mL | Freq: Two times a day (BID) | ORAL | Status: DC
  Administered 2016-12-12: 237 mL via ORAL
  Filled 2016-12-12 (×4): qty 237

## 2016-12-12 MED ORDER — ALBUTEROL SULFATE (2.5 MG/3ML) 0.083% IN NEBU: 2.5000 mg | INHALATION_SOLUTION | RESPIRATORY_TRACT | Status: DC | PRN

## 2016-12-12 MED ORDER — LISINOPRIL-HYDROCHLOROTHIAZIDE 20-12.5 MG PO TABS: 1.0000 | ORAL_TABLET | Freq: Every day | ORAL | Status: DC

## 2016-12-12 MED ORDER — FUROSEMIDE 10 MG/ML IJ SOLN
40.0000 mg | Freq: Two times a day (BID) | INTRAMUSCULAR | Status: DC
  Administered 2016-12-12 – 2016-12-15 (×7): 40 mg via INTRAVENOUS
  Filled 2016-12-12 (×7): qty 4

## 2016-12-12 MED ORDER — INSULIN ASPART 100 UNIT/ML ~~LOC~~ SOLN
0.0000 [IU] | Freq: Three times a day (TID) | SUBCUTANEOUS | Status: DC
  Administered 2016-12-12 (×3): 7 [IU] via SUBCUTANEOUS
  Administered 2016-12-13: 9 [IU] via SUBCUTANEOUS
  Administered 2016-12-13: 5 [IU] via SUBCUTANEOUS

## 2016-12-12 MED ORDER — INSULIN GLARGINE 100 UNIT/ML ~~LOC~~ SOLN
5.0000 [IU] | Freq: Every day | SUBCUTANEOUS | Status: DC
  Administered 2016-12-12: 5 [IU] via SUBCUTANEOUS
  Filled 2016-12-12 (×2): qty 0.05

## 2016-12-12 MED ORDER — ENOXAPARIN SODIUM 40 MG/0.4ML ~~LOC~~ SOLN: 40.0000 mg | SUBCUTANEOUS | Status: DC

## 2016-12-12 MED ORDER — GABAPENTIN 300 MG PO CAPS
300.0000 mg | ORAL_CAPSULE | Freq: Every day | ORAL | Status: DC
  Administered 2016-12-12 – 2016-12-18 (×6): 300 mg via ORAL
  Filled 2016-12-12 (×7): qty 1

## 2016-12-12 MED ORDER — LISINOPRIL 20 MG PO TABS
20.0000 mg | ORAL_TABLET | Freq: Every day | ORAL | Status: DC
  Administered 2016-12-12 – 2016-12-18 (×7): 20 mg via ORAL
  Filled 2016-12-12 (×8): qty 1

## 2016-12-12 MED ORDER — SODIUM CHLORIDE 0.9 % IV SOLN
250.0000 mL | INTRAVENOUS | Status: DC | PRN
  Administered 2016-12-14: 250 mL via INTRAVENOUS

## 2016-12-12 MED ORDER — ONDANSETRON HCL 4 MG/2ML IJ SOLN
4.0000 mg | Freq: Four times a day (QID) | INTRAMUSCULAR | Status: DC | PRN
  Administered 2016-12-16 – 2016-12-18 (×3): 4 mg via INTRAVENOUS
  Filled 2016-12-12 (×4): qty 2

## 2016-12-12 MED ORDER — HYDROCHLOROTHIAZIDE 12.5 MG PO CAPS
12.5000 mg | ORAL_CAPSULE | Freq: Every day | ORAL | Status: DC
  Administered 2016-12-12 – 2016-12-14 (×3): 12.5 mg via ORAL
  Filled 2016-12-12 (×3): qty 1

## 2016-12-12 MED ORDER — INSULIN ASPART 100 UNIT/ML ~~LOC~~ SOLN
10.0000 [IU] | Freq: Once | SUBCUTANEOUS | Status: AC
  Administered 2016-12-12: 10 [IU] via INTRAVENOUS
  Filled 2016-12-12: qty 1

## 2016-12-12 MED ORDER — HYDRALAZINE HCL 20 MG/ML IJ SOLN: 5.0000 mg | INTRAMUSCULAR | Status: DC | PRN

## 2016-12-12 MED ORDER — LIVING WELL WITH DIABETES BOOK
Freq: Once | Status: AC
  Administered 2016-12-12: 15:00:00
  Filled 2016-12-12: qty 1

## 2016-12-12 MED ORDER — INSULIN STARTER KIT- PEN NEEDLES (ENGLISH)
1.0000 | Freq: Once | Status: AC
  Administered 2016-12-12: 1
  Filled 2016-12-12: qty 1

## 2016-12-12 MED ORDER — SODIUM CHLORIDE 0.9% FLUSH
3.0000 mL | Freq: Two times a day (BID) | INTRAVENOUS | Status: DC
  Administered 2016-12-12 – 2016-12-19 (×15): 3 mL via INTRAVENOUS

## 2016-12-12 MED ORDER — MELOXICAM 7.5 MG PO TABS
15.0000 mg | ORAL_TABLET | Freq: Every day | ORAL | Status: DC
  Administered 2016-12-12 – 2016-12-13 (×2): 15 mg via ORAL
  Filled 2016-12-12 (×2): qty 2

## 2016-12-12 MED ORDER — ASPIRIN EC 81 MG PO TBEC
81.0000 mg | DELAYED_RELEASE_TABLET | Freq: Every day | ORAL | Status: DC
  Administered 2016-12-12 – 2016-12-18 (×7): 81 mg via ORAL
  Filled 2016-12-12 (×8): qty 1

## 2016-12-12 MED ORDER — ACETAMINOPHEN 325 MG PO TABS: 650.0000 mg | ORAL_TABLET | ORAL | Status: DC | PRN

## 2016-12-12 NOTE — Progress Notes (Signed)
Nutrition Education Note  RD consulted for nutrition education regarding new onset CHF.  RD provided "Heart Failure Nutrition Therapy" handout from the [employer] of [employer]. Reviewed patient's dietary recall. Provided examples on ways to decrease sodium intake in diet. Discouraged intake of processed foods and use of salt shaker. Encouraged fresh fruits and vegetables as well as whole grain sources of carbohydrates to maximize fiber intake.   RD discussed why it is important for patient to adhere to diet recommendations, and emphasized the role of fluids, foods to avoid, and importance of weighing self daily. Teach back method used. Pt very appreciative of information provided. She does all of her cooking at home and feels confident in her ability to follow diet. She demonstrates comprehension of diabetic diet as well. RD answered pt's questions regarding diet and blood glucose control.   Expect very good compliance.  Body mass index is 37.64 kg/m. Pt meets criteria for Obesity based on current BMI.  Current diet order is Renal 1.8 L fluid restriction, patient is consuming approximately 25 % of meals at this time. Labs and medications reviewed. RD contact information provided. See additional initial nutrition assessment note regarding additional nutrition interventions.   Scarlette Ar RD, LDN, CSP Inpatient Clinical Dietitian Pager: [phone] After Hours Pager: [phone]

## 2016-12-12 NOTE — H&P (Signed)
History and Physical    Mackenzie Park QMV:[id number] DOB: September 21, 1945 DOA: 12/11/2016  Referring MD/NP/PA:   PCP: Keith Rake, MD   Patient coming from:  The patient is coming from home.  At baseline, pt is independent for most of ADL.  Chief Complaint:   HPI: Mackenzie Park is a 71 y.o. female with medical history significant of Jehovah witness, hypertension, hyperlipidemia, diabetes mellitus, depression, dCHF, who presents with generalized weakness, elevated blood sugar, shortness breath.  Patient states that she has been feeling weak in the past 2 weeks, which has been progressively getting worse. No unilateral weakness, numbness or tingling in extremities. Patient also has shortness of breath, which has been going on for several months, which has worsened in the past while 2 weeks. She has worsening bilateral leg edema. She has cough with white mucus production, denies chest pain, fever or chills. Patient states that she has decreased urine output, and urine become dark. She has nausea, but no vomiting, diarrhea or abdominal pain. She denies symptoms of UTI. She states that she is taking Januvia for diabetes, but her blood sugar has been persistently high recently.  # pt is Jehovah witness, no blood transfusion.  ED Course: pt was found to have WBC 11.5, electrolytes renal function okay, blood sugar 492 which decreased to 287 after giving 10 U of novolog by IV, anion gap 11, negative urinalysis, lipase 56, abnormal liver functions with AST 92, ALT 23, ALP 91, total bilirubin 1.6, temperature normal, no tachycardia, oxygen saturation 91-98% on room air. X-ray of acute abdomen/chest is negative. Chest x-ray showed airspace disease in the right upper lobe, most likely due to atelectasis. Patient is admitted to telemetry bed as inpatient.  Review of Systems:   General: no fevers, chills, has poor appetite, has fatigue HEENT: no blurry vision, hearing changes or sore throat Respiratory: has  dyspnea, coughing, no wheezing CV: no chest pain, no palpitations GI: has nausea, no vomiting, abdominal pain, diarrhea, constipation GU: no dysuria, burning on urination, increased urinary frequency, hematuria  Ext: has leg edema Neuro: no unilateral weakness, numbness, or tingling, no vision change or hearing loss Skin: no rash, no skin tear. MSK: No muscle spasm, no deformity, no limitation of range of movement in spin Heme: No easy bruising.  Travel history: No recent long distant travel.  Allergy:  Allergies  Allergen Reactions  . Sulfa Antibiotics Anaphylaxis and Hives    Past Medical History:  Diagnosis Date  . Allergic rhinitis   . Depressive disorder   . Diabetes mellitus ([place])   . Hypercholesteremia   . Lumbosacral neuritis   . Osteoarthritis     Past Surgical History:  Procedure Laterality Date  . BARIATRIC SURGERY    . REPLACEMENT TOTAL KNEE BILATERAL      Social History:  reports that she quit smoking about 21 years ago. She has never used smokeless tobacco. She reports that she does not drink alcohol or use drugs.  Family History:  Family History  Problem Relation Age of Onset  . Dementia Father   . COPD Sister   . Cancer Sister   . Cancer Mother      Prior to Admission medications   Medication Sig Start Date End Date Taking? Authorizing Provider  aspirin EC 81 MG tablet Take 81 mg by mouth daily.   Yes Historical Provider, MD  fluticasone (FLONASE) 50 MCG/ACT nasal spray Place 1-2 sprays into both nostrils daily as needed for allergies.    Yes Historical  Provider, MD  gabapentin (NEURONTIN) 300 MG capsule Take 1 capsule (300 mg total) by mouth at bedtime. 11/08/16  Yes Roselee Nova, MD  lisinopril-hydrochlorothiazide (PRINZIDE,ZESTORETIC) 20-12.5 MG tablet Take 1 tablet by mouth daily. 11/08/16  Yes Roselee Nova, MD  meloxicam (MOBIC) 15 MG tablet Take 1 tablet (15 mg total) by mouth daily. 11/08/16  Yes Roselee Nova, MD  sitaGLIPtin (JANUVIA)  100 MG tablet Take 1 tablet (100 mg total) by mouth daily. 11/16/16  Yes Roselee Nova, MD  atorvastatin (LIPITOR) 20 MG tablet Take 1 tablet (20 mg total) by mouth daily. Patient not taking: Reported on 12/09/2016 11/08/16   Roselee Nova, MD  benzonatate (TESSALON PERLES) 100 MG capsule Take 1 capsule (100 mg total) by mouth 3 (three) times daily as needed for cough. Patient not taking: Reported on 12/09/2016 11/08/16   Roselee Nova, MD  glucose blood (FREESTYLE TEST STRIPS) test strip Use as instructed 12/09/16   Roselee Nova, MD    Physical Exam: Vitals:   12/12/16 0200 12/12/16 0230 12/12/16 0300 12/12/16 0315  BP: 134/60 (!) 144/81 (!) 120/56 135/61  Pulse: 63 69 72 61  Resp: 18   14  Temp:      TempSrc:      SpO2: 98% 97% 98% 96%  Weight:      Height:       General: Not in acute distress HEENT:       Eyes: PERRL, EOMI, no scleral icterus.       ENT: No discharge from the ears and nose, no pharynx injection, no tonsillar enlargement.        Neck: positive JVD, no bruit, no mass felt. Heme: No neck lymph node enlargement. Cardiac: S1/S2, RRR, No murmurs, No gallops or rubs. Respiratory:  No rales, wheezing, rhonchi or rubs. GI: Soft, nondistended, nontender, no rebound pain, no organomegaly, BS present. GU: No hematuria Ext: 3+ pitting leg edema bilaterally. 2+DP/PT pulse bilaterally. Musculoskeletal: No joint deformities, No joint redness or warmth, no limitation of ROM in spin. Skin: No rashes.  Neuro: Alert, oriented X3, cranial nerves II-XII grossly intact, moves all extremities normally.  Psych: Patient is not psychotic, no suicidal or hemocidal ideation.  Labs on Admission: I have personally reviewed following labs and imaging studies  CBC:  Recent Labs Lab 12/09/16 0729 12/11/16 1750  WBC 10.9 11.5*  HGB 12.5 11.8*  HCT 37.5 36.9  MCV 84.8 84.8  PLT 168 478   Basic Metabolic Panel:  Recent Labs Lab 12/09/16 0729 12/11/16 1750  NA 134* 133*  K  4.0 3.7  CL 93* 93*  CO2 31 29  GLUCOSE 313* 492*  BUN 16 19  CREATININE 0.63 0.90  CALCIUM 9.0 9.0   GFR: Estimated Creatinine Clearance: 74.5 mL/min (by C-G formula based on SCr of 0.9 mg/dL). Liver Function Tests:  Recent Labs Lab 12/09/16 0729 12/12/16 0021  AST 114* 92*  ALT 220* 203*  ALKPHOS 89 94  BILITOT 1.8* 1.4*  PROT 6.4* 6.4*  ALBUMIN 3.1* 3.1*    Recent Labs Lab 12/12/16 0021  LIPASE 56*   No results for input(s): AMMONIA in the last 168 hours. Coagulation Profile: No results for input(s): INR, PROTIME in the last 168 hours. Cardiac Enzymes: No results for input(s): CKTOTAL, CKMB, CKMBINDEX, TROPONINI in the last 168 hours. BNP (last 3 results) No results for input(s): PROBNP in the last 8760 hours. HbA1C: No results for input(s): HGBA1C  in the last 72 hours. CBG:  Recent Labs Lab 12/09/16 0710 12/11/16 1745 12/12/16 0041 12/12/16 0234 12/12/16 0349  GLUCAP 312* 471* 370* 379* 283*   Lipid Profile: No results for input(s): CHOL, HDL, LDLCALC, TRIG, CHOLHDL, LDLDIRECT in the last 72 hours. Thyroid Function Tests: No results for input(s): TSH, T4TOTAL, FREET4, T3FREE, THYROIDAB in the last 72 hours. Anemia Panel: No results for input(s): VITAMINB12, FOLATE, FERRITIN, TIBC, IRON, RETICCTPCT in the last 72 hours. Urine analysis:    Component Value Date/Time   COLORURINE YELLOW 12/11/2016 1802   APPEARANCEUR CLEAR 12/11/2016 1802   LABSPEC <1.005 (L) 12/11/2016 1802   PHURINE 5.5 12/11/2016 1802   GLUCOSEU >=500 (A) 12/11/2016 1802   HGBUR NEGATIVE 12/11/2016 1802   BILIRUBINUR NEGATIVE 12/11/2016 1802   KETONESUR NEGATIVE 12/11/2016 1802   PROTEINUR NEGATIVE 12/11/2016 1802   NITRITE NEGATIVE 12/11/2016 1802   LEUKOCYTESUR NEGATIVE 12/11/2016 1802   Sepsis Labs: '@LABRCNTIP'$ (procalcitonin:4,lacticidven:4) )No results found for this or any previous visit (from the past 240 hour(s)).   Radiological Exams on Admission: Dg Abdomen Acute  W/chest  Result Date: 12/12/2016 CLINICAL DATA:  Cough, upper abdominal pain EXAM: DG ABDOMEN ACUTE W/ 1V CHEST COMPARISON:  12/09/2016 and 05/11/2015 CXR FINDINGS: There is no evidence of dilated bowel loops or free intraperitoneal air. Surgical clips are seen in the upper abdomen bilaterally in the right upper quadrant and epigastric region as well as left lower quadrant. No radiopaque calculi. Lumbar degenerative changes are seen. Mild joint space narrowing of both hips. No acute osseous appearing abnormality. Heart size and mediastinal contours are within normal limits. Ovoid density along the minor fissure is noted which may reflect a pseudo lesion with trace fluid or atelectasis along side or within the fissure. IMPRESSION: Negative abdominal radiographs. No acute cardiopulmonary disease. Fluid or atelectasis along side the minor fissure as on recent comparison study. Electronically Signed   By: Ashley Royalty M.D.   On: 12/12/2016 00:39     EKG: Independently reviewed.  Not done in ED, will get one.   Assessment/Plan Principal Problem:   Acute on chronic diastolic CHF (congestive heart failure) (HCC) Active Problems:   Hypertension   Hyperlipidemia   Diabetes mellitus, type 2 (HCC)   Abnormal LFTs   CHF exacerbation (HCC)   Acute on chronic diastolic CHF: Patient's shortness breath is most likely caused by CHF exacerbation. 2-D echo on 9/60/16 showed EF 70% with grade 1 diastolic dysfunction. Patient has 3+ bilateral leg edema and positive JVD, consistent with CHF exacerbation. Pending BNP. Patient is not on diuretics at home currently.  -will admit to tele bed as inpt - f/u BNP -Lasix 40 mg bid by IV -trop x 3 -2d echo -Risk factor stratification: A1c, FLP -will continue home ASA -Daily weights -strict I/O's -Low salt diet  HTN: -continue prinzide -on IV lasix as above -IV hydralazine when necessary  HLD: was on lipitor before, but not taking it now -check LFP  DM-II: Last  A1c 7.4, not controled. Patient is taking Januvia at home. Patient has persistent hyperglycemia. -will start Lantus 5 units daily  -SSI -Check A1c  Abnormal LFTs:  AST 92, ALT 23, ALP 91, total bilirubin 1.6. Patient states that she took Tylenol 2 tablets per day before, but did not take any Tylenol in this month. She denies drinking alcohol. Etiology is not clear. Hepatic congestion secondary to CHF may partially contributed. -will check hepatitis panel -US-RUQ   DVT ppx:  SQ Lovenox Code Status: Full code Family  Communication: None at bed side.    Disposition Plan:  Anticipate discharge back to previous home environment Consults called:  none Admission status:  Inpatient/tele       Date of Service 12/12/2016    Ivor Costa [facility] Pager [phone]  If 7PM-7AM, please contact night-coverage [email or web address] Password [email or web address] 12/12/2016, 4:10 AM

## 2016-12-12 NOTE — Progress Notes (Addendum)
Inpatient Diabetes Program Recommendations  AACE/ADA: New Consensus Statement on Inpatient Glycemic Control (2015)  Target Ranges:  Prepandial:   less than 140 mg/dL      Peak postprandial:   less than 180 mg/dL (1-2 hours)      Critically ill patients:  140 - 180 mg/dL   Lab Results  Component Value Date   GLUCAP 338 (H) 12/12/2016   HGBA1C 7.4 11/08/2016   Results for BENNETTA, RUDDEN (MRN [id number]) as of 12/12/2016 11:07  Ref. Range 12/11/2016 17:45 12/12/2016 00:41 12/12/2016 02:34 12/12/2016 03:49 12/12/2016 07:49  Glucose-Capillary Latest Ref Range: 65 - 99 mg/dL 471 (H) 370 (H) 379 (H) 283 (H) 338 (H)   Review of Glycemic Control  Diabetes history: DM2, Obesity  Outpatient Diabetes medications: Januvia 100 mg daily (started on 11/16/16, however was still not taking on 12/07/2016 when went to Urgent Care)  Current orders for Inpatient glycemic control: sensitive correction scale Novolog 0-9 units TIDAC, Lantus 5 units daily  Inpatient Diabetes Program Recommendations:   Insulin - Basal: please consider increasing Lantus to 20 units daily (105.8 Kg * 0.2 units/ Kg).  Insulin - Correction scale: please consider increasing scale to moderate correction scale Novolog 0-15 units TIDAC and adding Novolog 0-5 units QHS.  Note: Gentry Fitz, RN with Inpatient DM Team, spoke with patient 05/12/2015 regarding taking DM medications.  Patient gave reasons to Almyra Free as to why she did not want to take Metformin, Glipizide, and Insulin for home management of DM.  Patient has not been taking most recently ordered medication (Januvia) due to side effects.    Spoke with patient at bedside about the following:  The progressive nature of DM and the need to take DM medications as ordered to prevent/control complications associated with DM.  Patient states that she is open to taking insulin and we discussed how to use insulin pen (does not want to use vial and syringes).   Patient education needs addressed  using teach back include: how and where to inject insulin, signs and symptoms of hypoglycemia, need to check CBG's 4 times/day, need to see PCP within a week of D/C.  Patient ordered the following by this RN: Insulin Starter Kit for pens, Living Well with DM, Consult the [facility], patient education videos, patient education at bedside for patient to give self insulin.  Thank you,  Windy Carina, RN, MSN Diabetes Coordinator Inpatient Diabetes Program [phone] (Team Pager)

## 2016-12-12 NOTE — Progress Notes (Signed)
PT Cancellation Note/ Discharge  Patient Details Name: OZIE DIMARIA MRN: [id number] DOB: 1946-06-04   Cancelled Treatment:    Reason Eval/Treat Not Completed: PT screened, no needs identified, will sign off (order received and chart reviewed. Per RN, pt and OT pt without P.T. needs at this time. Will sign off)   Ahmad Vanwey B Skyy Nilan 12/12/2016, 11:04 AM  Elwyn Reach, [place]

## 2016-12-12 NOTE — Progress Notes (Signed)
Initial Nutrition Assessment  DOCUMENTATION CODES:   Obesity unspecified  INTERVENTION:  Provide Premier Protein once daily, provides 30 grams of protein and 180 kcal.  Provide Glucerna Shake once daily, provides 10 grams of protein and 220 kcal.   Recommend changing diet to Heart Healthy/Carb Modified  NUTRITION DIAGNOSIS:   Inadequate oral intake related to poor appetite as evidenced by per patient/family report, energy intake < 75% for > 7 days.   GOAL:   Patient will meet greater than or equal to 90% of their needs   MONITOR:   PO intake, Labs, I & O's, Weight trends  REASON FOR ASSESSMENT:   Malnutrition Screening Tool    ASSESSMENT:   71 y.o. female with medical history significant of Jehovah witness, hypertension, hyperlipidemia, diabetes mellitus, depression, dCHF, who presents with generalized weakness, elevated blood sugar, shortness breath.  Pt states that for the past 2 weeks she has had a poor appetite with early satiety and taste changes. She states that she wasn't eating anything until yesterday, she started drinking smoothies and eating split pea soup. She tried eating breakfast this morning, but was only able to eat a few bites of bagel before developing a feeling of reflux. She reports that she usually weighs 245 lbs. Pt is currently ordered Nepro Shakes BID, will change to Premier Protein and Glucerna Shake once daily Low sodium education provided and question regarding blood glucose control were answered; see separate education note.   Labs: elevated glucose, elevated AST/ALT  Diet Order:  Diet renal with fluid restriction Fluid restriction: 1800 mL Fluid; Room service appropriate? Yes; Fluid consistency: Thin  Skin:  Reviewed, no issues  Last BM:  4/8  Height:   Ht Readings from Last 1 Encounters:  12/12/16 '5\' 6"'$  (1.676 m)    Weight:   Wt Readings from Last 1 Encounters:  12/12/16 233 lb 3.2 oz (105.8 kg)    Ideal Body Weight:  59.09  kg  BMI:  Body mass index is 37.64 kg/m.  Estimated Nutritional Needs:   Kcal:  1600-1800  Protein:  80-95 grams  Fluid:  2 L/day  EDUCATION NEEDS:   Education needs addressed  Scarlette Ar RD, LDN, CSP Inpatient Clinical Dietitian Pager: [phone] After Hours Pager: [phone]

## 2016-12-12 NOTE — Progress Notes (Signed)
  PROGRESS NOTE    Mackenzie Park  LKJ:[id number] DOB: [birth year] DOA: 12/11/2016 PCP: Keith Rake, MD   Chief Complaint  Patient presents with  . Hyperglycemia  . Weakness  . Urinary Retention    Brief Narrative:  HPI on 12/12/2016 by Dr. Ivor Costa OCIA SIMEK is a 71 y.o. female with medical history significant of Jehovah witness, hypertension, hyperlipidemia, diabetes mellitus, depression, dCHF, who presents with generalized weakness, elevated blood sugar, shortness breath.  Patient states that she has been feeling weak in the past 2 weeks, which has been progressively getting worse. No unilateral weakness, numbness or tingling in extremities. Patient also has shortness of breath, which has been going on for several months, which has worsened in the past while 2 weeks. She has worsening bilateral leg edema. She has cough with white mucus production, denies chest pain, fever or chills. Patient states that she has decreased urine output, and urine become dark. She has nausea, but no vomiting, diarrhea or abdominal pain. She denies symptoms of UTI. She states that she is taking Januvia for diabetes, but her blood sugar has been persistently high recently. Assessment & Plan   Patient admitted earlier today by Dr. Ivor Costa, see full H&P for details.  Acute on chronic diastolic CHF -Presented with shortness of breath and LE edema -Echocardiogram 9/60/16 showed EF 70% with grade 1 diastolic dysfunction. -BNP 62.2 -CXR unremarkable  -Placed on IV lasix '40mg'$  BID -Echocardiogram pending -Monitor intake/output, daily weights -Continue HCTZ, lisinopril   Essential hypertension -continue lisinopril, HCTZ, IV lasix  Hyperlipidemia  -Lipid panel: TC 138, HDL 53, LDL 75, TG 52 -Was on statin in the past, no longer taking  Diabetes mellitus, type II -Hemoglobin A1c 7.4 on 11/08/2016 -Continue Lantus, ISS, CBG monitoring   Abnormal LFTs -were abnormal prior to admission, patient does take  Tylenol- however has not recently -Etiology unclear -Continue to monitor    -Pending RUQ [place] -hepatitis panel pending   DVT Prophylaxis  Lovenox  Code Status: Full  Family Communication: None at bedside  Disposition Plan: Admitted  Consultants None  Procedures  None  Time Spent in minutes   30 minutes  Mikenna Bunkley D.O. on 12/12/2016 at 1:20 PM  Between 7am to 7pm - Pager - [phone]  After 7pm go to [email or web address] - password [email or web address]  And look for the night coverage person covering for me after hours  [facility] Office  [phone]

## 2016-12-12 NOTE — Evaluation (Signed)
Occupational Therapy Evaluation Patient Details Name: Mackenzie Park MRN: [id number] DOB: 12-23-1945 Today's Date: 12/12/2016    History of Present Illness 71 y.o. female with medical history significant of hypertension, hyperlipidemia, diabetes mellitus, depression, dCHF, who presents with generalized weakness, elevated blood sugar, shortness breath.   Clinical Impression   PTA, pt lived alone and was independent. Pt currently performing ADLs and functional mobility near baseline function. No acute OT needs at this time. Answered all questions and provided education. Will sign off acute OT. Recommend pt dc home once medically stable per physician.     Follow Up Recommendations  No OT follow up;Supervision - Intermittent    Equipment Recommendations  None recommended by OT    Recommendations for Other Services       Precautions / Restrictions Restrictions Weight Bearing Restrictions: No      Mobility Bed Mobility Overal bed mobility: Modified Independent             General bed mobility comments: Benefits from increased time  Transfers Overall transfer level: Independent Equipment used: None                  Balance Overall balance assessment: Independent                                         ADL either performed or assessed with clinical judgement   ADL Overall ADL's : At baseline;Modified independent                                       General ADL Comments: Pt near baseline function and benefits from increased time to perform ADLs and functional mobility     Vision         Perception     Praxis      Pertinent Vitals/Pain Pain Assessment: No/denies pain     Hand Dominance Right   Extremity/Trunk Assessment Upper Extremity Assessment Upper Extremity Assessment: Overall WFL for tasks assessed   Lower Extremity Assessment Lower Extremity Assessment: Defer to PT evaluation   Cervical / Trunk  Assessment Cervical / Trunk Assessment: Normal   Communication Communication Communication: No difficulties   Cognition Arousal/Alertness: Awake/alert Behavior During Therapy: WFL for tasks assessed/performed Overall Cognitive Status: Within Functional Limits for tasks assessed                                     General Comments       Exercises     Shoulder Instructions      Home Living Family/patient expects to be discharged to:: Private residence Living Arrangements: Alone Available Help at Discharge: Family;Friend(s) Type of Home: House Home Access: Ramped entrance     Home Layout: One level     Bathroom Shower/Tub: Occupational psychologist: Handicapped height     Home Equipment: Grab bars - tub/shower;Walker - 2 wheels   Additional Comments: Pt lives with family nearby      Prior Functioning/Environment Level of Independence: Independent        Comments: Independent in ADLs, IADLs, driving, and working        OT Problem List: Decreased activity tolerance;Decreased knowledge of use of DME or AE  OT Treatment/Interventions:      OT Goals(Current goals can be found in the care plan section) Acute Rehab OT Goals Patient Stated Goal: Get better and go home OT Goal Formulation: With patient Time For Goal Achievement: 12/26/16 Potential to Achieve Goals: Good  OT Frequency:     Barriers to D/C:            Co-evaluation              End of Session Nurse Communication: Mobility status  Activity Tolerance: Patient tolerated treatment well Patient left: in bed;with call bell/phone within reach  OT Visit Diagnosis: Muscle weakness (generalized) (M62.81)                Time: 8592-9244 OT Time Calculation (min): 12 min Charges:  OT General Charges $OT Visit: 1 Procedure OT Evaluation $OT Eval Low Complexity: 1 Procedure G-Codes:     Dailee Manalang, OTR/L [phone]  Heywood Footman Yoanna Jurczyk 12/12/2016, 8:43 AM

## 2016-12-12 NOTE — ED Notes (Signed)
Patient transported to Ultrasound 

## 2016-12-12 NOTE — Progress Notes (Signed)
CRITICAL VALUE ALERT  Critical value received:  Troponin 0.03  Date of notification:  12/12/2016  Time of notification:  0530  Critical value read back: Yes  Nurse who received alert:  Magdalen Spatz  MD notified (1st page):  Chaney Malling NP  Time of first page:  [phone]

## 2016-12-12 NOTE — ED Notes (Signed)
Attempted report x 1; name and call back number provided 

## 2016-12-13 ENCOUNTER — Inpatient Hospital Stay (HOSPITAL_COMMUNITY): Payer: Medicare HMO

## 2016-12-13 ENCOUNTER — Other Ambulatory Visit (HOSPITAL_COMMUNITY): Payer: Medicare HMO

## 2016-12-13 ENCOUNTER — Encounter (HOSPITAL_COMMUNITY): Payer: Medicare HMO

## 2016-12-13 DIAGNOSIS — I509 Heart failure, unspecified: Secondary | ICD-10-CM

## 2016-12-13 DIAGNOSIS — R600 Localized edema: Secondary | ICD-10-CM

## 2016-12-13 DIAGNOSIS — E785 Hyperlipidemia, unspecified: Secondary | ICD-10-CM

## 2016-12-13 DIAGNOSIS — R531 Weakness: Secondary | ICD-10-CM

## 2016-12-13 DIAGNOSIS — I10 Essential (primary) hypertension: Secondary | ICD-10-CM

## 2016-12-13 DIAGNOSIS — R739 Hyperglycemia, unspecified: Secondary | ICD-10-CM

## 2016-12-13 DIAGNOSIS — E118 Type 2 diabetes mellitus with unspecified complications: Secondary | ICD-10-CM

## 2016-12-13 DIAGNOSIS — R7989 Other specified abnormal findings of blood chemistry: Secondary | ICD-10-CM

## 2016-12-13 DIAGNOSIS — K7689 Other specified diseases of liver: Secondary | ICD-10-CM

## 2016-12-13 DIAGNOSIS — R740 Nonspecific elevation of levels of transaminase and lactic acid dehydrogenase [LDH]: Secondary | ICD-10-CM

## 2016-12-13 LAB — COMPREHENSIVE METABOLIC PANEL
ALBUMIN: 3.3 g/dL — AB (ref 3.5–5.0)
ALT: 238 U/L — AB (ref 14–54)
ANION GAP: 13 (ref 5–15)
AST: 132 U/L — AB (ref 15–41)
Alkaline Phosphatase: 100 U/L (ref 38–126)
BUN: 16 mg/dL (ref 6–20)
CALCIUM: 9 mg/dL (ref 8.9–10.3)
CO2: 33 mmol/L — ABNORMAL HIGH (ref 22–32)
CREATININE: 0.86 mg/dL (ref 0.44–1.00)
Chloride: 89 mmol/L — ABNORMAL LOW (ref 101–111)
GFR calc Af Amer: >60 mL/min (ref >60)
GFR calc non Af Amer: >60 mL/min (ref >60)
GLUCOSE: 379 mg/dL — AB (ref 65–99)
Potassium: 2.9 mmol/L — ABNORMAL LOW (ref 3.5–5.1)
SODIUM: 135 mmol/L (ref 135–145)
Total Bilirubin: 1 mg/dL (ref 0.3–1.2)
Total Protein: 6.5 g/dL (ref 6.5–8.1)

## 2016-12-13 LAB — GLUCOSE, CAPILLARY
GLUCOSE-CAPILLARY: 389 mg/dL — AB (ref 65–99)
Glucose-Capillary: 381 mg/dL — ABNORMAL HIGH (ref 65–99)
Glucose-Capillary: 292 mg/dL — ABNORMAL HIGH (ref 65–99)
Glucose-Capillary: 227 mg/dL — ABNORMAL HIGH (ref 65–99)

## 2016-12-13 LAB — ECHOCARDIOGRAM COMPLETE
Height: 66 in
Weight: 3646.4 oz

## 2016-12-13 LAB — CBC
HEMATOCRIT: 36.4 % (ref 36.0–46.0)
HEMOGLOBIN: 11.9 g/dL — AB (ref 12.0–15.0)
MCH: 27.5 pg (ref 26.0–34.0)
MCHC: 32.7 g/dL (ref 30.0–36.0)
MCV: 84.3 fL (ref 78.0–100.0)
Platelets: 286 10*3/uL (ref 150–400)
RBC: 4.32 MIL/uL (ref 3.87–5.11)
RDW: 13.8 % (ref 11.5–15.5)
WBC: 12.4 10*3/uL — AB (ref 4.0–10.5)

## 2016-12-13 LAB — HEPATITIS PANEL, ACUTE
HCV Ab: <0.1 s/co ratio (ref 0.0–0.9)
HEP A IGM: NEGATIVE
HEP B C IGM: NEGATIVE
Hepatitis B Surface Ag: NEGATIVE

## 2016-12-13 MED ORDER — INSULIN ASPART 100 UNIT/ML ~~LOC~~ SOLN
6.0000 [IU] | Freq: Three times a day (TID) | SUBCUTANEOUS | Status: DC
  Administered 2016-12-13 – 2016-12-14 (×2): 6 [IU] via SUBCUTANEOUS

## 2016-12-13 MED ORDER — INSULIN GLARGINE 100 UNIT/ML ~~LOC~~ SOLN
20.0000 [IU] | Freq: Every day | SUBCUTANEOUS | Status: DC
  Filled 2016-12-13: qty 0.2

## 2016-12-13 MED ORDER — POTASSIUM CHLORIDE CRYS ER 20 MEQ PO TBCR
40.0000 meq | EXTENDED_RELEASE_TABLET | Freq: Two times a day (BID) | ORAL | Status: AC
  Administered 2016-12-13 (×2): 40 meq via ORAL
  Filled 2016-12-13 (×2): qty 2

## 2016-12-13 MED ORDER — INSULIN GLARGINE 100 UNIT/ML ~~LOC~~ SOLN
10.0000 [IU] | Freq: Every day | SUBCUTANEOUS | Status: DC
  Administered 2016-12-13: 10 [IU] via SUBCUTANEOUS
  Filled 2016-12-13: qty 0.1

## 2016-12-13 MED ORDER — INSULIN ASPART 100 UNIT/ML ~~LOC~~ SOLN
0.0000 [IU] | Freq: Three times a day (TID) | SUBCUTANEOUS | Status: DC
  Administered 2016-12-13: 15 [IU] via SUBCUTANEOUS
  Administered 2016-12-14: 11 [IU] via SUBCUTANEOUS
  Administered 2016-12-14: 3 [IU] via SUBCUTANEOUS
  Administered 2016-12-14: 11 [IU] via SUBCUTANEOUS
  Administered 2016-12-15: 5 [IU] via SUBCUTANEOUS
  Administered 2016-12-15 – 2016-12-16 (×3): 3 [IU] via SUBCUTANEOUS
  Administered 2016-12-16: 8 [IU] via SUBCUTANEOUS
  Administered 2016-12-16: 5 [IU] via SUBCUTANEOUS
  Administered 2016-12-17: 3 [IU] via SUBCUTANEOUS
  Administered 2016-12-17: 2 [IU] via SUBCUTANEOUS
  Administered 2016-12-17 – 2016-12-18 (×2): 3 [IU] via SUBCUTANEOUS
  Administered 2016-12-18: 5 [IU] via SUBCUTANEOUS
  Administered 2016-12-18: 3 [IU] via SUBCUTANEOUS
  Administered 2016-12-19: 5 [IU] via SUBCUTANEOUS
  Administered 2016-12-19: 3 [IU] via SUBCUTANEOUS

## 2016-12-13 MED ORDER — INSULIN GLARGINE 100 UNIT/ML ~~LOC~~ SOLN
20.0000 [IU] | Freq: Every day | SUBCUTANEOUS | Status: DC
  Administered 2016-12-14: 20 [IU] via SUBCUTANEOUS
  Filled 2016-12-13: qty 0.2

## 2016-12-13 MED ORDER — INSULIN GLARGINE 100 UNIT/ML ~~LOC~~ SOLN
10.0000 [IU] | SUBCUTANEOUS | Status: AC
  Administered 2016-12-13: 10 [IU] via SUBCUTANEOUS
  Filled 2016-12-13 (×2): qty 0.1

## 2016-12-13 MED ORDER — INSULIN ASPART 100 UNIT/ML ~~LOC~~ SOLN
0.0000 [IU] | Freq: Every day | SUBCUTANEOUS | Status: DC
  Administered 2016-12-13: 2 [IU] via SUBCUTANEOUS

## 2016-12-13 NOTE — Progress Notes (Signed)
  Echocardiogram 2D Echocardiogram has been performed.  Mackenzie Park 12/13/2016, 11:46 AM

## 2016-12-13 NOTE — Progress Notes (Signed)
PROGRESS NOTE    Mackenzie Park  ZOX:[id number] DOB: 11-Jun-1946 DOA: 12/11/2016 PCP: Keith Rake, MD   Chief Complaint  Patient presents with  . Hyperglycemia  . Weakness  . Urinary Retention     Brief Narrative:  71 year old female with history of hypertension, hyperlipidemia, diabetes mellitus, diastolic heart failure, who is a Sales promotion account executive Witness, presented with generalized weakness and elevated blood sugars and shortness of breath. Patient is currently feeling better after being placed on Lasix. Currently pending echocardiogram Assessment & Plan   Acute on chronic diastolic CHF -Presented with shortness of breath and LE edema -Echocardiogram 9/60/16 showed EF 70% with grade 1 diastolic dysfunction. -BNP 62.2 -CXR unremarkable  -Placed on IV lasix '40mg'$  BID -Echocardiogram pending -Monitor intake/output, daily weights- UOP over past 24 hours 2520cc; weight down from 233lbs to 227lbs -Continue HCTZ, lisinopril   Essential hypertension -continue lisinopril, HCTZ, IV lasix  Hyperlipidemia  -Lipid panel: TC 138, HDL 53, LDL 75, TG 52 -Was on statin in the past, no longer taking. Would not restart at this point given her elevated transaminases.  Diabetes mellitus, type II -Hemoglobin A1c 7.4 on 11/08/2016 -Continue Lantus, ISS, CBG monitoring  -Was placed on januiva however, was too expensive so she was not taking the medication  Abnormal LFTs -were abnormal prior to admission, patient does take Tylenol- however has not recently -Etiology unclear, possibly related to ?mobic use - will discontinue Mobic  -Continue to monitor  -RUQ [place]: Multiple heterogeneous hepatic masses noted, measuring up to 6 cm in size. CT/MRI recommended. -hepatitis panel unremarkable  -May need outpatient GI follow up  LLE edema -Possible due to CHF  -Will obtain Doppler to rule out DVT  DVT Prophylaxis  Lovenox  Code Status: Full  Family Communication: None at bedside  Disposition Plan:  Admitted. Pending echo and LE doppler  Consultants None  Procedures  RUQ [place]  Antibiotics   Anti-infectives    None      Subjective:   Mackenzie Park seen and examined today.  Patient feels breathing has improved. Denies any current chest pain, abdominal pain, nausea or vomiting, diarrhea or constipation.    Objective:   Vitals:   12/12/16 2100 12/13/16 0207 12/13/16 0509 12/13/16 0832  BP: (!) 123/47 (!) 115/49 (!) 128/51 109/76  Pulse: 74 71 70 88  Resp: '20 18 17 18  '$ Temp: 97.8 F (36.6 C) 98.2 F (36.8 C) 97.8 F (36.6 C) 98 F (36.7 C)  TempSrc: Oral Oral Oral Oral  SpO2: 98% 97% 94% 95%  Weight:   103.4 kg (227 lb 14.4 oz)   Height:        Intake/Output Summary (Last 24 hours) at 12/13/16 1122 Last data filed at 12/13/16 0956  Gross per 24 hour  Intake              700 ml  Output             3170 ml  Net            -2470 ml   Filed Weights   12/11/16 1744 12/12/16 0500 12/13/16 0509  Weight: 111.1 kg (245 lb) 105.8 kg (233 lb 3.2 oz) 103.4 kg (227 lb 14.4 oz)    Exam  General: Well developed, well nourished, NAD, appears stated age  HEENT: NCAT, mucous membranes moist.   Neck: Supple, no masses  Cardiovascular: S1 S2 auscultated, no rubs, murmurs or gallops. Regular rate and rhythm.  Respiratory: Diminished, however clear  Abdomen: Soft, nontender, nondistended, +  bowel sounds  Extremities: warm dry without cyanosis clubbing. +1LE edema L>R   Neuro: AAOx3, nonfocal  Psych: Normal affect and demeanor with intact judgement and insight   Data Reviewed: I have personally reviewed following labs and imaging studies  CBC:  Recent Labs Lab 12/09/16 0729 12/11/16 1750 12/13/16 0700  WBC 10.9 11.5* 12.4*  HGB 12.5 11.8* 11.9*  HCT 37.5 36.9 36.4  MCV 84.8 84.8 84.3  PLT 168 221 161   Basic Metabolic Panel:  Recent Labs Lab 12/09/16 0729 12/11/16 1750 12/12/16 0410 12/13/16 0700  NA 134* 133*  --  135  K 4.0 3.7  --  2.9*  CL 93*  93*  --  89*  CO2 31 29  --  33*  GLUCOSE 313* 492*  --  379*  BUN 16 19  --  16  CREATININE 0.63 0.90  --  0.86  CALCIUM 9.0 9.0  --  9.0  MG  --   --  1.9  --    GFR: Estimated Creatinine Clearance: 75 mL/min (by C-G formula based on SCr of 0.86 mg/dL). Liver Function Tests:  Recent Labs Lab 12/09/16 0729 12/12/16 0021 12/13/16 0700  AST 114* 92* 132*  ALT 220* 203* 238*  ALKPHOS 89 94 100  BILITOT 1.8* 1.4* 1.0  PROT 6.4* 6.4* 6.5  ALBUMIN 3.1* 3.1* 3.3*    Recent Labs Lab 12/12/16 0021  LIPASE 56*   No results for input(s): AMMONIA in the last 168 hours. Coagulation Profile: No results for input(s): INR, PROTIME in the last 168 hours. Cardiac Enzymes:  Recent Labs Lab 12/12/16 0405 12/12/16 1022 12/12/16 1556  TROPONINI 0.03* <0.03 <0.03   BNP (last 3 results) No results for input(s): PROBNP in the last 8760 hours. HbA1C: No results for input(s): HGBA1C in the last 72 hours. CBG:  Recent Labs Lab 12/12/16 0749 12/12/16 1206 12/12/16 1618 12/12/16 2133 12/13/16 0751  GLUCAP 338* 308* 343* 385* 381*   Lipid Profile: No results for input(s): CHOL, HDL, LDLCALC, TRIG, CHOLHDL, LDLDIRECT in the last 72 hours. Thyroid Function Tests: No results for input(s): TSH, T4TOTAL, FREET4, T3FREE, THYROIDAB in the last 72 hours. Anemia Panel: No results for input(s): VITAMINB12, FOLATE, FERRITIN, TIBC, IRON, RETICCTPCT in the last 72 hours. Urine analysis:    Component Value Date/Time   COLORURINE YELLOW 12/11/2016 1802   APPEARANCEUR CLEAR 12/11/2016 1802   LABSPEC <1.005 (L) 12/11/2016 1802   PHURINE 5.5 12/11/2016 1802   GLUCOSEU >=500 (A) 12/11/2016 1802   HGBUR NEGATIVE 12/11/2016 1802   BILIRUBINUR NEGATIVE 12/11/2016 1802   KETONESUR NEGATIVE 12/11/2016 1802   PROTEINUR NEGATIVE 12/11/2016 1802   NITRITE NEGATIVE 12/11/2016 1802   LEUKOCYTESUR NEGATIVE 12/11/2016 1802   Sepsis Labs: '@LABRCNTIP'$ (procalcitonin:4,lacticidven:4)  ) Recent  Results (from the past 240 hour(s))  Urine culture     Status: None (Preliminary result)   Collection Time: 12/11/16  6:02 PM  Result Value Ref Range Status   Specimen Description URINE, RANDOM  Final   Special Requests NONE  Final   Culture CULTURE REINCUBATED FOR BETTER GROWTH  Final   Report Status PENDING  Incomplete      Radiology Studies: Dg Abdomen Acute W/chest  Result Date: 12/12/2016 CLINICAL DATA:  Cough, upper abdominal pain EXAM: DG ABDOMEN ACUTE W/ 1V CHEST COMPARISON:  12/09/2016 and 05/11/2015 CXR FINDINGS: There is no evidence of dilated bowel loops or free intraperitoneal air. Surgical clips are seen in the upper abdomen bilaterally in the right upper quadrant and epigastric region  as well as left lower quadrant. No radiopaque calculi. Lumbar degenerative changes are seen. Mild joint space narrowing of both hips. No acute osseous appearing abnormality. Heart size and mediastinal contours are within normal limits. Ovoid density along the minor fissure is noted which may reflect a pseudo lesion with trace fluid or atelectasis along side or within the fissure. IMPRESSION: Negative abdominal radiographs. No acute cardiopulmonary disease. Fluid or atelectasis along side the minor fissure as on recent comparison study. Electronically Signed   By: Ashley Royalty M.D.   On: 12/12/2016 00:39   US Abdomen Limited Ruq  Result Date: 12/12/2016 CLINICAL DATA:  Acute onset of abnormal LFTs.  Initial encounter. EXAM: US ABDOMEN LIMITED - RIGHT UPPER QUADRANT COMPARISON:  None. FINDINGS: Gallbladder: Status post cholecystectomy.  No retained stones seen. Common bile duct: Diameter: 1.2 cm, likely within normal limits status post cholecystectomy. Liver: Multiple masses are noted within the liver, diffusely heterogeneous in appearance. There is a complex 5.8 x 5.6 x 5.5 cm mass at the right hepatic lobe, a 6.0 x 4.7 x 3.5 cm mass also at the right hepatic lobe, and a 4.7 x 4.5 x 3.2 cm mass at the left  hepatic lobe. There is diffuse heterogeneity of hepatic echogenicity. IMPRESSION: Multiple heterogeneous hepatic masses noted, measuring up to 6 cm in size. These are difficult to fully characterize on ultrasound. Dynamic liver protocol MRI or CT is recommended for further evaluation. Electronically Signed   By: Garald Balding M.D.   On: 12/12/2016 04:55     Scheduled Meds: . aspirin EC  81 mg Oral Daily  . enoxaparin (LOVENOX) injection  40 mg Subcutaneous Q24H  . feeding supplement (GLUCERNA SHAKE)  237 mL Oral Q24H  . furosemide  40 mg Intravenous Q12H  . gabapentin  300 mg Oral QHS  . lisinopril  20 mg Oral Daily   And  . hydrochlorothiazide  12.5 mg Oral Daily  . insulin aspart  0-9 Units Subcutaneous TID WC  . insulin glargine  10 Units Subcutaneous Daily  . protein supplement shake  11 oz Oral Q24H  . sodium chloride flush  3 mL Intravenous Q12H   Continuous Infusions:   LOS: 1 day   Time Spent in minutes   30 minutes  Kamela Blansett D.O. on 12/13/2016 at 11:22 AM  Between 7am to 7pm - Pager - [phone]  After 7pm go to [email or web address] - password [email or web address]  And look for the night coverage person covering for me after hours  [facility] Office  [phone]

## 2016-12-13 NOTE — Progress Notes (Signed)
Inpatient Diabetes Program Recommendations  AACE/ADA: New Consensus Statement on Inpatient Glycemic Control (2015)  Target Ranges:  Prepandial:   less than 140 mg/dL      Peak postprandial:   less than 180 mg/dL (1-2 hours)      Critically ill patients:  140 - 180 mg/dL   Lab Results  Component Value Date   GLUCAP 292 (H) 12/13/2016   HGBA1C 7.4 11/08/2016    Review of Glycemic Control  Diabetes history: DM2, Obesity  Outpatient Diabetes medications: Januvia 100 mg daily (started on 11/16/16, however was still not taking on 12/07/2016 when went to Urgent Care)  Current orders for Inpatient glycemic control: sensitive correction scale Novolog 0-9 units TIDAC, Lantus 10 units daily.  Inpatient Diabetes Program Recommendations:   Insulin - Basal: Please consider giving Lantus 10 units now (STAT) and then increase Lantus to 20 units on 12/14/16.  Correction (SSI): Please consider increasing to moderate correction and add Novolog 0-5 units QHS.  Insulin - Meal Coverage: Please consider adding meal coverage of Novolog 6 units TIDAC if patient eats > 50% of meal.  Thank you,  Windy Carina, RN, MSN Diabetes Coordinator Inpatient Diabetes Program [phone] (Team Pager)

## 2016-12-13 NOTE — Consult Note (Signed)
[facility] CM Inpatient Consult   12/13/2016  Mackenzie Park 17-Apr-1946 [id number]  Referral received for beneficiary of [facility] for diabetes follow up and assistance with management needs.  Chart review reveals the patient is Mackenzie Park is a 71 y.o. female with medical history significant of Jehovah witness, hypertension, hyperlipidemia, diabetes mellitus, depression, dCHF, who presents with generalized weakness, elevated blood sugar, shortness breath. Patient admitted with shortness of breath, which has been going on for several months, which has worsened in the past while 2 weeks. She has worsening bilateral leg edema. She has cough with white mucus production, denies chest pain, fever or chills. Patient states that she has decreased urine output, and urine become dark. She has nausea, but no vomiting, diarrhea or abdominal pain. She denies symptoms of UTI. She states that she is taking Januvia for diabetes, but her blood sugar has been persistently high recently per MD notes on H&P. Met with the patient at the bedside regarding referral and follow up on needs.  Patient states she is really motivated in getting her diabetes and HF under control.  States, "I need some help with this.  I am struggling with my appetite for salt and things I have been eating.  I 've had the gastric surgery and has lost 80-85 lbs and I have pretty much kept it off but my blood sugar is not right."  Patient will receive post hospital follow up calls and be assessed for home visits. Consent form signed, folder with [facility] information given with contact informatin.  For questions, please contact:  Natividad Brood, RN BSN [facility] Liaison  [phone] business mobile phone Toll free office [phone]

## 2016-12-14 ENCOUNTER — Inpatient Hospital Stay (HOSPITAL_COMMUNITY): Payer: Medicare HMO

## 2016-12-14 ENCOUNTER — Encounter (HOSPITAL_COMMUNITY): Payer: Self-pay | Admitting: Physician Assistant

## 2016-12-14 DIAGNOSIS — R609 Edema, unspecified: Secondary | ICD-10-CM

## 2016-12-14 DIAGNOSIS — I5033 Acute on chronic diastolic (congestive) heart failure: Secondary | ICD-10-CM

## 2016-12-14 LAB — CBC WITH DIFFERENTIAL/PLATELET
BASOS ABS: 0 10*3/uL (ref 0.0–0.1)
BASOS PCT: 0 %
Eosinophils Absolute: 0 10*3/uL (ref 0.0–0.7)
Eosinophils Relative: 0 %
HEMATOCRIT: 36.2 % (ref 36.0–46.0)
HEMOGLOBIN: 11.4 g/dL — AB (ref 12.0–15.0)
Lymphocytes Relative: 6 %
Lymphs Abs: 0.9 10*3/uL (ref 0.7–4.0)
MCH: 27 pg (ref 26.0–34.0)
MCHC: 31.5 g/dL (ref 30.0–36.0)
MCV: 85.8 fL (ref 78.0–100.0)
MONOS PCT: 7 %
Monocytes Absolute: 1.1 10*3/uL — ABNORMAL HIGH (ref 0.1–1.0)
NEUTROS ABS: 13.1 10*3/uL — AB (ref 1.7–7.7)
Neutrophils Relative %: 87 %
PLATELETS: 305 10*3/uL (ref 150–400)
RBC: 4.22 MIL/uL (ref 3.87–5.11)
RDW: 13.9 % (ref 11.5–15.5)
WBC: 15 10*3/uL — ABNORMAL HIGH (ref 4.0–10.5)

## 2016-12-14 LAB — CBC
HEMATOCRIT: 32.4 % — AB (ref 36.0–46.0)
Hemoglobin: 10.4 g/dL — ABNORMAL LOW (ref 12.0–15.0)
MCH: 27.2 pg (ref 26.0–34.0)
MCHC: 32.1 g/dL (ref 30.0–36.0)
MCV: 84.6 fL (ref 78.0–100.0)
Platelets: 226 10*3/uL (ref 150–400)
RBC: 3.83 MIL/uL — ABNORMAL LOW (ref 3.87–5.11)
RDW: 13.9 % (ref 11.5–15.5)
WBC: 10.9 10*3/uL — AB (ref 4.0–10.5)

## 2016-12-14 LAB — GLUCOSE, CAPILLARY
GLUCOSE-CAPILLARY: 192 mg/dL — AB (ref 65–99)
Glucose-Capillary: 341 mg/dL — ABNORMAL HIGH (ref 65–99)
Glucose-Capillary: 348 mg/dL — ABNORMAL HIGH (ref 65–99)
Glucose-Capillary: 172 mg/dL — ABNORMAL HIGH (ref 65–99)

## 2016-12-14 LAB — COMPREHENSIVE METABOLIC PANEL
ALT: 228 U/L — ABNORMAL HIGH (ref 14–54)
AST: 112 U/L — AB (ref 15–41)
Albumin: 2.8 g/dL — ABNORMAL LOW (ref 3.5–5.0)
Alkaline Phosphatase: 89 U/L (ref 38–126)
Anion gap: 10 (ref 5–15)
BILIRUBIN TOTAL: 0.6 mg/dL (ref 0.3–1.2)
BUN: 25 mg/dL — AB (ref 6–20)
CO2: 38 mmol/L — ABNORMAL HIGH (ref 22–32)
Calcium: 8.8 mg/dL — ABNORMAL LOW (ref 8.9–10.3)
Chloride: 89 mmol/L — ABNORMAL LOW (ref 101–111)
Creatinine, Ser: 0.75 mg/dL (ref 0.44–1.00)
GFR calc Af Amer: >60 mL/min (ref >60)
Glucose, Bld: 299 mg/dL — ABNORMAL HIGH (ref 65–99)
POTASSIUM: 2.6 mmol/L — AB (ref 3.5–5.1)
Sodium: 137 mmol/L (ref 135–145)
TOTAL PROTEIN: 5.4 g/dL — AB (ref 6.5–8.1)

## 2016-12-14 LAB — URINE CULTURE

## 2016-12-14 LAB — POTASSIUM: POTASSIUM: 3.6 mmol/L (ref 3.5–5.1)

## 2016-12-14 LAB — PROTIME-INR
INR: 1.04
Prothrombin Time: 13.6 seconds (ref 11.4–15.2)

## 2016-12-14 MED ORDER — INSULIN GLARGINE 100 UNIT/ML ~~LOC~~ SOLN
20.0000 [IU] | Freq: Two times a day (BID) | SUBCUTANEOUS | Status: DC
  Administered 2016-12-14 – 2016-12-18 (×8): 20 [IU] via SUBCUTANEOUS
  Filled 2016-12-14 (×11): qty 0.2

## 2016-12-14 MED ORDER — POTASSIUM CHLORIDE CRYS ER 20 MEQ PO TBCR
40.0000 meq | EXTENDED_RELEASE_TABLET | Freq: Every day | ORAL | Status: DC
  Administered 2016-12-15 – 2016-12-16 (×2): 40 meq via ORAL
  Filled 2016-12-14 (×2): qty 2

## 2016-12-14 MED ORDER — LORAZEPAM 2 MG/ML IJ SOLN: 1.0000 mg | Freq: Once | INTRAMUSCULAR | Status: DC | PRN

## 2016-12-14 MED ORDER — GADOBENATE DIMEGLUMINE 529 MG/ML IV SOLN
20.0000 mL | Freq: Once | INTRAVENOUS | Status: AC | PRN
  Administered 2016-12-14: 20 mL via INTRAVENOUS

## 2016-12-14 MED ORDER — POTASSIUM CHLORIDE CRYS ER 20 MEQ PO TBCR
40.0000 meq | EXTENDED_RELEASE_TABLET | ORAL | Status: AC
  Administered 2016-12-14 (×2): 40 meq via ORAL
  Filled 2016-12-14 (×2): qty 2

## 2016-12-14 MED ORDER — INSULIN ASPART 100 UNIT/ML ~~LOC~~ SOLN
4.0000 [IU] | Freq: Three times a day (TID) | SUBCUTANEOUS | Status: DC
  Administered 2016-12-14 – 2016-12-19 (×13): 4 [IU] via SUBCUTANEOUS

## 2016-12-14 MED ORDER — MAGNESIUM SULFATE IN D5W 1-5 GM/100ML-% IV SOLN
1.0000 g | Freq: Once | INTRAVENOUS | Status: AC
  Administered 2016-12-14: 1 g via INTRAVENOUS
  Filled 2016-12-14: qty 100

## 2016-12-14 MED ORDER — METOPROLOL TARTRATE 12.5 MG HALF TABLET
12.5000 mg | ORAL_TABLET | Freq: Two times a day (BID) | ORAL | Status: DC
  Administered 2016-12-14 – 2016-12-18 (×10): 12.5 mg via ORAL
  Filled 2016-12-14 (×10): qty 1

## 2016-12-14 MED ORDER — SODIUM CHLORIDE 0.9 % IV SOLN
30.0000 meq | Freq: Two times a day (BID) | INTRAVENOUS | Status: AC
  Administered 2016-12-14 – 2016-12-15 (×2): 30 meq via INTRAVENOUS
  Filled 2016-12-14 (×3): qty 15

## 2016-12-14 MED ORDER — INSULIN GLARGINE 100 UNIT/ML ~~LOC~~ SOLN
5.0000 [IU] | SUBCUTANEOUS | Status: AC
  Administered 2016-12-14: 5 [IU] via SUBCUTANEOUS
  Filled 2016-12-14: qty 0.05

## 2016-12-14 NOTE — Progress Notes (Signed)
As per his request, I have notified Dr. Candiss Norse that pt's K+ is now 3.6.

## 2016-12-14 NOTE — Progress Notes (Signed)
PROGRESS NOTE    TEIGAN MANNER  YIR:[id number] DOB: 10/02/1945 DOA: 12/11/2016 PCP: Keith Rake, MD   Chief Complaint  Patient presents with  . Hyperglycemia  . Weakness  . Urinary Retention     Brief Narrative:   71 year old female with history of hypertension, hyperlipidemia, diabetes mellitus, diastolic heart failure, who is a Sales promotion account executive Witness, presented with generalized weakness and elevated blood sugars and shortness of breath. Patient is currently feeling better after being placed on Lasix. Currently pending echocardiogram.   Assessment & Plan   Acute on chronic diastolic CHF EF 50% - Improving on IV Lasix which will be continued, placed on salt and fluid restriction, monitor weight intake and output. Continue Lasix and ACE inhibitor for now. We'll place on low-dose beta blocker as well.  Essential hypertension - on Lasix and ACE inhibitor, low-dose beta blocker will be added as well  Hyperlipidemia - was on statin currently held due to elevated LFTs likely can be resumed after GI workup.  Abnormal LFTs -were abnormal prior to admission, patient does take Tylenol- however has not recently, hepatitis panel unremarkable, right upper quadrant ultrasound showed multiple masses, MRI liver protocol ordered. Will request GI to evaluate as well.  LLE edema -  due to CHF, negative ultrasound of the lower extremities.    Diabetes mellitus, type II - Hemoglobin A1c 7.4 on 11/08/2016, have adjusted Lantus dose, continue ISS, CBG monitoring , Was placed on januiva however, was too expensive so she was not taking the medication, may need to go home on Lantus and sliding scale  CBG (last 3)   Recent Labs  12/13/16 1634 12/13/16 2108 12/14/16 0749  GLUCAP 389* 227* 341*      DVT Prophylaxis  Lovenox  Code Status: Full  Family Communication: None at bedside  Disposition Plan: Home 1-2 days  Consultants - Cards and GI   Procedures   TTE - Vigorous LV systolic  function; grade 1 diastolic dysfunction; elevated LV filling pressure; mild LVH.  Leg [place] - negative for clots  RUQ [place] - Multiple heterogeneous hepatic masses noted, measuring up to 6 cm in size. These are difficult to fully characterize on ultrasound. Dynamic liver protocol MRI or CT is recommended for further evaluation.   Antibiotics   Anti-infectives    None      Subjective:   Lurline Del in bed, denies any headache, no fever or chills, no chest abdominal pain, no shortness of breath.  Objective:   Vitals:   12/13/16 0832 12/13/16 1317 12/13/16 2033 12/14/16 0529  BP: 109/76 (!) 104/45 (!) 98/42 (!) 115/53  Pulse: 88 74 79 96  Resp: '18 18 17 18  '$ Temp: 98 F (36.7 C) 97.8 F (36.6 C) 97.8 F (36.6 C) 97.8 F (36.6 C)  TempSrc: Oral Oral Oral Oral  SpO2: 95% 96% 96% 99%  Weight:    101 kg (222 lb 11.2 oz)  Height:        Intake/Output Summary (Last 24 hours) at 12/14/16 1100 Last data filed at 12/14/16 1001  Gross per 24 hour  Intake             1212 ml  Output             3400 ml  Net            -2188 ml   Filed Weights   12/12/16 0500 12/13/16 0509 12/14/16 0529  Weight: 105.8 kg (233 lb 3.2 oz) 103.4 kg (227 lb 14.4 oz) 101  kg (222 lb 11.2 oz)    Exam  General: Well developed, well nourished African American female in bed in no distress,  HEENT: NCAT, mucous membranes moist.   Neck: Supple, no masses  Cardiovascular: S1 S2 auscultated, no rubs, murmurs or gallops. Regular rate and rhythm.  Respiratory: Diminished, few bibasilar rales  Abdomen: Soft, nontender, nondistended, + bowel sounds  Extremities: warm dry without cyanosis clubbing. Bilateral trace edema left more than right  Neuro: AAOx3, nonfocal  Psych: Normal affect and demeanor with intact judgement and insight   Data Reviewed: I have personally reviewed following labs and imaging studies  CBC:  Recent Labs Lab 12/09/16 0729 12/11/16 1750 12/13/16 0700 12/14/16 0321  WBC  10.9 11.5* 12.4* 10.9*  HGB 12.5 11.8* 11.9* 10.4*  HCT 37.5 36.9 36.4 32.4*  MCV 84.8 84.8 84.3 84.6  PLT 168 221 286 706   Basic Metabolic Panel:  Recent Labs Lab 12/09/16 0729 12/11/16 1750 12/12/16 0410 12/13/16 0700 12/14/16 0321  NA 134* 133*  --  135 137  K 4.0 3.7  --  2.9* 2.6*  CL 93* 93*  --  89* 89*  CO2 31 29  --  33* 38*  GLUCOSE 313* 492*  --  379* 299*  BUN 16 19  --  16 25*  CREATININE 0.63 0.90  --  0.86 0.75  CALCIUM 9.0 9.0  --  9.0 8.8*  MG  --   --  1.9  --   --    GFR: Estimated Creatinine Clearance: 79.6 mL/min (by C-G formula based on SCr of 0.75 mg/dL). Liver Function Tests:  Recent Labs Lab 12/09/16 0729 12/12/16 0021 12/13/16 0700 12/14/16 0321  AST 114* 92* 132* 112*  ALT 220* 203* 238* 228*  ALKPHOS 89 94 100 89  BILITOT 1.8* 1.4* 1.0 0.6  PROT 6.4* 6.4* 6.5 5.4*  ALBUMIN 3.1* 3.1* 3.3* 2.8*    Recent Labs Lab 12/12/16 0021  LIPASE 56*   No results for input(s): AMMONIA in the last 168 hours. Coagulation Profile: No results for input(s): INR, PROTIME in the last 168 hours. Cardiac Enzymes:  Recent Labs Lab 12/12/16 0405 12/12/16 1022 12/12/16 1556  TROPONINI 0.03* <0.03 <0.03   BNP (last 3 results) No results for input(s): PROBNP in the last 8760 hours. HbA1C: No results for input(s): HGBA1C in the last 72 hours. CBG:  Recent Labs Lab 12/13/16 0751 12/13/16 1134 12/13/16 1634 12/13/16 2108 12/14/16 0749  GLUCAP 381* 292* 389* 227* 341*   Lipid Profile: No results for input(s): CHOL, HDL, LDLCALC, TRIG, CHOLHDL, LDLDIRECT in the last 72 hours. Thyroid Function Tests: No results for input(s): TSH, T4TOTAL, FREET4, T3FREE, THYROIDAB in the last 72 hours. Anemia Panel: No results for input(s): VITAMINB12, FOLATE, FERRITIN, TIBC, IRON, RETICCTPCT in the last 72 hours. Urine analysis:    Component Value Date/Time   COLORURINE YELLOW 12/11/2016 1802   APPEARANCEUR CLEAR 12/11/2016 1802   LABSPEC <1.005 (L)  12/11/2016 1802   PHURINE 5.5 12/11/2016 1802   GLUCOSEU >=500 (A) 12/11/2016 1802   HGBUR NEGATIVE 12/11/2016 1802   BILIRUBINUR NEGATIVE 12/11/2016 1802   KETONESUR NEGATIVE 12/11/2016 1802   PROTEINUR NEGATIVE 12/11/2016 1802   NITRITE NEGATIVE 12/11/2016 1802   LEUKOCYTESUR NEGATIVE 12/11/2016 1802   Sepsis Labs: '@LABRCNTIP'$ (procalcitonin:4,lacticidven:4)  ) Recent Results (from the past 240 hour(s))  Urine culture     Status: Abnormal   Collection Time: 12/11/16  6:02 PM  Result Value Ref Range Status   Specimen Description URINE, RANDOM  Final   Special Requests NONE  Final   Culture (A)  Final    >=100,000 COLONIES/mL GROUP B STREP(S.AGALACTIAE)ISOLATED TESTING AGAINST S. AGALACTIAE NOT ROUTINELY PERFORMED DUE TO PREDICTABILITY OF AMP/PEN/VAN SUSCEPTIBILITY. 20,000 COLONIES/mL ESCHERICHIA COLI    Report Status 12/14/2016 FINAL  Final   Organism ID, Bacteria ESCHERICHIA COLI (A)  Final      Susceptibility   Escherichia coli - MIC*    AMPICILLIN <=2 SENSITIVE Sensitive     CEFAZOLIN <=4 SENSITIVE Sensitive     CEFTRIAXONE <=1 SENSITIVE Sensitive     CIPROFLOXACIN <=0.25 SENSITIVE Sensitive     GENTAMICIN <=1 SENSITIVE Sensitive     IMIPENEM <=0.25 SENSITIVE Sensitive     NITROFURANTOIN <=16 SENSITIVE Sensitive     TRIMETH/SULFA <=20 SENSITIVE Sensitive     AMPICILLIN/SULBACTAM <=2 SENSITIVE Sensitive     PIP/TAZO <=4 SENSITIVE Sensitive     Extended ESBL NEGATIVE Sensitive     * 20,000 COLONIES/mL ESCHERICHIA COLI      Radiology Studies: No results found.   Scheduled Meds: . aspirin EC  81 mg Oral Daily  . enoxaparin (LOVENOX) injection  40 mg Subcutaneous Q24H  . feeding supplement (GLUCERNA SHAKE)  237 mL Oral Q24H  . furosemide  40 mg Intravenous Q12H  . gabapentin  300 mg Oral QHS  . insulin aspart  0-15 Units Subcutaneous TID WC  . insulin aspart  0-5 Units Subcutaneous QHS  . insulin aspart  4 Units Subcutaneous TID WC  . insulin glargine  20 Units  Subcutaneous BID  . insulin glargine  5 Units Subcutaneous NOW  . lisinopril  20 mg Oral Daily  . magnesium sulfate 1 - 4 g bolus IVPB  1 g Intravenous Once  . potassium chloride (KCL MULTIRUN) 30 mEq in 265 mL IVPB  30 mEq Intravenous BID  . [START ON 12/15/2016] potassium chloride  40 mEq Oral Daily  . protein supplement shake  11 oz Oral Q24H  . sodium chloride flush  3 mL Intravenous Q12H   Continuous Infusions:   LOS: 2 days   Time Spent in minutes   30 minutes  Signature  Lala Lund M.D on 12/14/2016 at 11:00 AM  Between 7am to 7pm - Pager - [phone] ( page via [facility], text pages only, please mention full 10 digit call back number).  After 7pm go to [email or web address] - password [email or web address]

## 2016-12-14 NOTE — Progress Notes (Signed)
Pt. With critical K+ of 2.6 this am. On call for [facility] made aware via amion text page. Pt. Asymptomatic. Alert and stable. No s/s of distress noted. RN will continue to monitor for changes in condition. Tanita Palinkas, Katherine Roan

## 2016-12-14 NOTE — Progress Notes (Signed)
*  PRELIMINARY RESULTS* Vascular Ultrasound Bilateral lower extremity venous duplex has been completed.  Preliminary findings: No evidence of deep vein thrombosis in the visualized veins of the lower extremities.  Negative for baker's cysts bilaterally.   [name] 12/14/2016, 9:39 AM

## 2016-12-14 NOTE — Consult Note (Signed)
[facility] Consult: 12:32 PM 12/14/2016  LOS: 2 days    Referring Provider: Dr Candiss Norse  Primary Care Physician:  Keith Rake, MD Primary Gastroenterologist:  Dr. Colbert Coyer.      Reason for Consultation:  Liver masses   HPI: Mackenzie Park is a 71 y.o. female.  PMH Morbid obesity.  s/p laparoscopic Roux-en-Y gastric bypass.  Type 2 DM.  CHFd.  Depression.  s/p bil TKR.  Jehovah's Witness.    02/19/2003 Roux-en Y gastric bypass.  Dr Georgette Dover and Dr Hiram Comber.  02/2003 intraoperative EGD by Elta Guadeloupe B. Wilkiemeyer, MD. For determination of pouch size, verification of anastomotic patency and integrity.  Findings: 25 ml gastric pouch.  Patent GJ anastomosis.  Air insufflation revealed to small leaks at anastomotic staple line.  These were repaired with suture.   Colonoscopies x 2, both with multiple (adenomatous?) polyps in [place] by Dr Allen Norris. Latest was 12/2013 recurrent adenomatous colon polyps.   Since Easter weekend,  April 1, experiencing weakness, DOE, orthopnea.  Difficult to control blood sugars have been an ongoing issues.  No improvement of blood sugars or malaise etc with addition of Januvia, which she could barely afford at $326 for a 90 day supply, and despite which sugars still running into 400s.    Spent 5 hours at [facility] ED, presenting with weakness and high sugars, on 4/6 and felt neglected, received IVF and says she felt no better at discharge.  Elevated transaminases:114/220.  t bili 1.8.  Glucose 313. On questioning, told ED MD her PMD had advised her previously of abnormal LFTs.  Was advised to follow up with PMD.    Returned to [facility] ED on 4/8 with same sxs. ROS:  persistent, generalized weakness, abdominal discomfort, leg swelling, frequency, cough, nausea and darker urine. Glucose 429.   Transaminases 93/23, t bili 1.6.  Alk phos 91. Lipase 56 ? RUL atx on xray.  Admitted with ? Diastolic CHF flare.   LFTs have ebbed and flowed since admission.   Abdominal ultrasound 4/9: Multiple liver masses, up to 6 cm max size.  Liver is diffuse heterogeneous.  1.2 cm CBD.   Only previous CT I found is from 2004 and liver normal then.  Pending for this afternoon is an MRI. 2D echo: grade 1 diastolic dysfunction, EF 65 to 70%.  High ventricular filling pressures.  Blood pressures soft/hypotensive.   She describes vague, LUQ abdominal discomfort and decreased appetite for a few weaks.  She does not drink ETOH.   No family hx of colon cancer or liver disease.      Past Medical History:  Diagnosis Date  . Allergic rhinitis   . Depressive disorder   . Diabetes mellitus ([place])   . Hypercholesteremia   . Lumbosacral neuritis   . Osteoarthritis     Past Surgical History:  Procedure Laterality Date  . 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 [facility].   . REPLACEMENT TOTAL KNEE BILATERAL      Prior to Admission medications   Medication Sig Start  Date End Date Taking? Authorizing Provider  aspirin EC 81 MG tablet Take 81 mg by mouth daily.   Yes Historical Provider, MD  fluticasone (FLONASE) 50 MCG/ACT nasal spray Place 1-2 sprays into both nostrils daily as needed for allergies.    Yes Historical Provider, MD  gabapentin (NEURONTIN) 300 MG capsule Take 1 capsule (300 mg total) by mouth at bedtime. 11/08/16  Yes Roselee Nova, MD  lisinopril-hydrochlorothiazide (PRINZIDE,ZESTORETIC) 20-12.5 MG tablet Take 1 tablet by mouth daily. 11/08/16  Yes Roselee Nova, MD  meloxicam (MOBIC) 15 MG tablet Take 1 tablet (15 mg total) by mouth daily. 11/08/16  Yes Roselee Nova, MD  sitaGLIPtin (JANUVIA) 100 MG tablet Take 1 tablet (100 mg total) by mouth daily. 11/16/16  Yes Roselee Nova, MD  atorvastatin (LIPITOR) 20 MG tablet Take 1 tablet (20 mg total) by mouth daily. Patient  not taking: Reported on 12/09/2016 11/08/16   Roselee Nova, MD  benzonatate (TESSALON PERLES) 100 MG capsule Take 1 capsule (100 mg total) by mouth 3 (three) times daily as needed for cough. Patient not taking: Reported on 12/09/2016 11/08/16   Roselee Nova, MD  glucose blood (FREESTYLE TEST STRIPS) test strip Use as instructed 12/09/16   Roselee Nova, MD    Scheduled Meds: . aspirin EC  81 mg Oral Daily  . enoxaparin (LOVENOX) injection  40 mg Subcutaneous Q24H  . feeding supplement (GLUCERNA SHAKE)  237 mL Oral Q24H  . furosemide  40 mg Intravenous Q12H  . gabapentin  300 mg Oral QHS  . insulin aspart  0-15 Units Subcutaneous TID WC  . insulin aspart  0-5 Units Subcutaneous QHS  . insulin aspart  4 Units Subcutaneous TID WC  . insulin glargine  20 Units Subcutaneous BID  . lisinopril  20 mg Oral Daily  . metoprolol tartrate  12.5 mg Oral BID  . potassium chloride (KCL MULTIRUN) 30 mEq in 265 mL IVPB  30 mEq Intravenous BID  . [START ON 12/15/2016] potassium chloride  40 mEq Oral Daily  . protein supplement shake  11 oz Oral Q24H  . sodium chloride flush  3 mL Intravenous Q12H   Infusions:  PRN Meds: sodium chloride, acetaminophen, albuterol, dextromethorphan-guaiFENesin, fluticasone, hydrALAZINE, LORazepam, ondansetron (ZOFRAN) IV, sodium chloride flush   Allergies as of 12/11/2016 - Review Complete 12/11/2016  Allergen Reaction Noted  . Sulfa antibiotics Anaphylaxis and Hives 12/08/2014    Family History  Problem Relation Age of Onset  . Dementia Father   . COPD Sister   . Cancer Sister   . Cancer Mother     Social History   Social History  . Marital status: Single    Spouse name: N/A  . Number of children: N/A  . Years of education: N/A   Occupational History  . Not on file.   Social History Main Topics  . Smoking status: Former Smoker    Quit date: 08/20/1995  . Smokeless tobacco: Never Used  . Alcohol use No  . Drug use: No  . Sexual activity: Not on  file   Other Topics Concern  . 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   She is single. She has no children. She has a support system specifically with her niece.    REVIEW OF SYSTEMS: Constitutional:  Per HPI ENT:  Nasal  congestion Pulm:  Thick cough, DOE, orthopnea CV:  No palpitations, no LE edema. No chest pain. GU:  Darker than usual urine GI:  Per HPI.  Dysphagia only to large potassium pills.  Heme:  Generally does not have excessive bleeding or bruising but after IV, phlebotomy draws at [place] last week, she developed some large hematomas on her right arm   Transfusions:  No previous blood transfusions. Neuro:  No headaches, no peripheral tingling or numbness  Derm:  No itching, no rash or sores.  Endocrine:  No sweats or chills.  No polyuria or dysuria Immunization:  Her influenza vaccine was completed January 2018. Travel:  None beyond local counties in last few months.    PHYSICAL EXAM: Vital signs in last 24 hours: Vitals:   12/13/16 2033 12/14/16 0529  BP: (!) 98/42 (!) 115/53  Pulse: 79 96  Resp: 17 18  Temp: 97.8 F (36.6 C) 97.8 F (36.6 C)   Wt Readings from Last 3 Encounters:  12/14/16 101 kg (222 lb 11.2 oz)  12/09/16 111.1 kg (245 lb)  12/07/16 110.2 kg (243 lb)    General: Pleasant, obese, somewhat chronically ill-appearing AAF. Head:  No asymmetry. No facial edema. No signs of head trauma.  Eyes:  No scleral icterus. No conjunctival pallor. EOMI. Ears:  Not hard of hearing  Nose:  Sounds congested. Mouth:  Patient has partial dentures above and below. She remove these for the exam and the mucosa is clear, pink and moist. Tongue is midline. Neck:  No JVD, no masses, no thyromegaly. Lungs:  She is coughing up yellowish, frothy sputum. She is not dyspneic at rest. Breath sounds are clear but diminished bilaterally Heart: RRR. No MRG. S1, S2 present. Abdomen:   Soft. Not tender or distended. The texture of her subcutaneous tissue throughout her abdomen is nodular/reticulated.  No HSM, bruits, hernias.   Rectal: Deferred   Musc/Skeltl: Scars and altered appearance to the knees consistent with bilateral TKR Extremities:  No CCE.  Neurologic:  Alert.  Good historian. Oriented times 3. No tremor. No limb weakness. Skin:  Hematomas on the right antecubital area and forearm. No suspicious sores, rashes or lesions. Tattoos:  None Nodes:  No cervical adenopathy   Psych:  Pleasant, calm, cooperative.  Intake/Output from previous day: 04/10 0701 - 04/11 0700 In: 1160 [P.O.:1160] Out: 4200 [Urine:4200] Intake/Output this shift: Total I/O In: 512 [P.O.:412; Other:100] Out: 1150 [Urine:1150]  LAB RESULTS:  Recent Labs  12/11/16 1750 12/13/16 0700 12/14/16 0321  WBC 11.5* 12.4* 10.9*  HGB 11.8* 11.9* 10.4*  HCT 36.9 36.4 32.4*  PLT 221 286 226   BMET Lab Results  Component Value Date   NA 137 12/14/2016   NA 135 12/13/2016   NA 133 (L) 12/11/2016   K 2.6 (LL) 12/14/2016   K 2.9 (L) 12/13/2016   K 3.7 12/11/2016   CL 89 (L) 12/14/2016   CL 89 (L) 12/13/2016   CL 93 (L) 12/11/2016   CO2 38 (H) 12/14/2016   CO2 33 (H) 12/13/2016   CO2 29 12/11/2016   GLUCOSE 299 (H) 12/14/2016   GLUCOSE 379 (H) 12/13/2016   GLUCOSE 492 (H) 12/11/2016   BUN 25 (H) 12/14/2016   BUN 16 12/13/2016   BUN 19 12/11/2016   CREATININE 0.75 12/14/2016   CREATININE 0.86 12/13/2016   CREATININE 0.90 12/11/2016   CALCIUM 8.8 (L) 12/14/2016   CALCIUM 9.0 12/13/2016   CALCIUM 9.0 12/11/2016   LFT  Recent Labs  12/12/16 0021  12/13/16 0700 12/14/16 0321  PROT 6.4* 6.5 5.4*  ALBUMIN 3.1* 3.3* 2.8*  AST 92* 132* 112*  ALT 203* 238* 228*  ALKPHOS 94 100 89  BILITOT 1.4* 1.0 0.6  BILIDIR 0.3  --   --   IBILI 1.1*  --   --    PT/INR No results found for: INR, PROTIME Hepatitis Panel  Recent Labs  12/12/16 0405  HEPBSAG Negative  HCVAB <0.1    HEPAIGM Negative  HEPBIGM Negative   C-Diff No components found for: CDIFF Lipase     Component Value Date/Time   LIPASE 56 (H) 12/12/2016 0021    Drugs of Abuse  No results found for: LABOPIA, COCAINSCRNUR, LABBENZ, AMPHETMU, THCU, LABBARB   RADIOLOGY STUDIES: No results found.    IMPRESSION:   *  Multiple liver masses with fluctuating elevations of primarily transaminases >> t bili. MRI of abdomen is pending.    *  Hypokalemia.   *  Diastolic CHF, sxs improved with diuresis and weight is down from 245# to 222#.     *  Type 2 DM, difficult to control.    *  Normocytic anemia.  *  S/p 2004 Roux-en-Y gastric bypass. She lost approximately 80 pounds but remains significantly obese.  *  Hx adenomatous colon polyps, last colonoscopy 12/2013 with Dr Allen Norris.    PLAN:     *  Alphafetoprotein added to order for coags today.     Azucena Freed  12/14/2016, 12:32 PM Pager: [phone]    ________________________________________________________________________  Velora Heckler [facility] MD note:  I personally examined the patient, reviewed the data and agree with the assessment and plan described above.  Abnormal lfts and numerous liver masses by [place].  Has had some dysphagia-like symptoms recently.  Awaiting MRI results and she may need further testing pending those results.   Owens Loffler, MD [facility] Pager [phone]

## 2016-12-15 ENCOUNTER — Encounter (HOSPITAL_COMMUNITY): Payer: Self-pay | Admitting: Radiology

## 2016-12-15 ENCOUNTER — Inpatient Hospital Stay (HOSPITAL_COMMUNITY): Payer: Medicare HMO

## 2016-12-15 DIAGNOSIS — R160 Hepatomegaly, not elsewhere classified: Secondary | ICD-10-CM

## 2016-12-15 LAB — COMPREHENSIVE METABOLIC PANEL
ALK PHOS: 89 U/L (ref 38–126)
ALT: 237 U/L — ABNORMAL HIGH (ref 14–54)
ANION GAP: 9 (ref 5–15)
AST: 142 U/L — ABNORMAL HIGH (ref 15–41)
Albumin: 2.9 g/dL — ABNORMAL LOW (ref 3.5–5.0)
BUN: 30 mg/dL — ABNORMAL HIGH (ref 6–20)
CALCIUM: 8.8 mg/dL — AB (ref 8.9–10.3)
CO2: 35 mmol/L — ABNORMAL HIGH (ref 22–32)
Chloride: 94 mmol/L — ABNORMAL LOW (ref 101–111)
Creatinine, Ser: 0.81 mg/dL (ref 0.44–1.00)
GFR calc non Af Amer: >60 mL/min (ref >60)
Glucose, Bld: 159 mg/dL — ABNORMAL HIGH (ref 65–99)
Potassium: 3.2 mmol/L — ABNORMAL LOW (ref 3.5–5.1)
SODIUM: 138 mmol/L (ref 135–145)
TOTAL PROTEIN: 5.3 g/dL — AB (ref 6.5–8.1)
Total Bilirubin: 0.8 mg/dL (ref 0.3–1.2)

## 2016-12-15 LAB — GLUCOSE, CAPILLARY
GLUCOSE-CAPILLARY: 176 mg/dL — AB (ref 65–99)
GLUCOSE-CAPILLARY: 212 mg/dL — AB (ref 65–99)
GLUCOSE-CAPILLARY: 153 mg/dL — AB (ref 65–99)
GLUCOSE-CAPILLARY: 157 mg/dL — AB (ref 65–99)

## 2016-12-15 LAB — AFP TUMOR MARKER: AFP TUMOR MARKER: 2.1 ng/mL (ref 0.0–8.3)

## 2016-12-15 LAB — MAGNESIUM: Magnesium: 2.1 mg/dL (ref 1.7–2.4)

## 2016-12-15 MED ORDER — IOPAMIDOL (ISOVUE-300) INJECTION 61%
INTRAVENOUS | Status: AC
  Administered 2016-12-15: 30 mL
  Filled 2016-12-15: qty 100

## 2016-12-15 MED ORDER — IOPAMIDOL (ISOVUE-300) INJECTION 61%
INTRAVENOUS | Status: AC
  Administered 2016-12-15: 30 mL
  Filled 2016-12-15: qty 30

## 2016-12-15 MED ORDER — IOPAMIDOL (ISOVUE-300) INJECTION 61%
75.0000 mL | Freq: Once | INTRAVENOUS | Status: AC | PRN
Start: 2016-12-15 — End: 2016-12-15
  Administered 2016-12-15: 75 mL via INTRAVENOUS

## 2016-12-15 MED ORDER — FUROSEMIDE 10 MG/ML IJ SOLN
40.0000 mg | Freq: Every day | INTRAMUSCULAR | Status: DC
  Administered 2016-12-16: 40 mg via INTRAVENOUS
  Filled 2016-12-15 (×2): qty 4

## 2016-12-15 NOTE — Progress Notes (Signed)
[facility] Progress Note    Since last GI note: MRI last night shows diffuse metastatic appearing masses throughout her liver, possibly adrenal.  Objective: Vital signs in last 24 hours: Temp:  [97.5 F (36.4 C)-98.3 F (36.8 C)] 98.3 F (36.8 C) (04/12 0449) Pulse Rate:  [66-91] 66 (04/12 0449) Resp:  [18] 18 (04/12 0449) BP: (105-113)/(46-51) 105/46 (04/12 0449) SpO2:  [94 %-98 %] 98 % (04/12 0449) Weight:  [224 lb 4.8 oz (101.7 kg)] 224 lb 4.8 oz (101.7 kg) (04/12 0449) Last BM Date: 12/13/16 General: alert and oriented times 3 Heart: regular rate and rythm Abdomen: soft, non-tender, non-distended, normal bowel sounds   Lab Results:  Recent Labs  12/13/16 0700 12/14/16 0321 12/14/16 1627  WBC 12.4* 10.9* 15.0*  HGB 11.9* 10.4* 11.4*  PLT 286 226 305  MCV 84.3 84.6 85.8    Recent Labs  12/13/16 0700 12/14/16 0321 12/14/16 1405 12/15/16 0246  NA 135 137  --  138  K 2.9* 2.6* 3.6 3.2*  CL 89* 89*  --  94*  CO2 33* 38*  --  35*  GLUCOSE 379* 299*  --  159*  BUN 16 25*  --  30*  CREATININE 0.86 0.75  --  0.81  CALCIUM 9.0 8.8*  --  8.8*    Recent Labs  12/13/16 0700 12/14/16 0321 12/15/16 0246  PROT 6.5 5.4* 5.3*  ALBUMIN 3.3* 2.8* 2.9*  AST 132* 112* 142*  ALT 238* 228* 237*  ALKPHOS 100 89 89  BILITOT 1.0 0.6 0.8    Recent Labs  12/14/16 1405  INR 1.04     Studies/Results: Mr Liver W Wo Contrast  Result Date: 12/14/2016 CLINICAL DATA:  Elevated liver function tests. Liver masses seen on recent ultrasound. EXAM: MRI ABDOMEN WITHOUT AND WITH CONTRAST TECHNIQUE: Multiplanar multisequence MR imaging of the abdomen was performed both before and after the administration of intravenous contrast. CONTRAST:  27m MULTIHANCE GADOBENATE DIMEGLUMINE 529 MG/ML IV SOLN COMPARISON:  Ultrasound on 12/12/2016 FINDINGS: Lower chest: No acute findings. Hepatobiliary: Innumerable hypovascular metastases are seen throughout the right and left  hepatic lobes. Majority of the larger metastases show central necrosis. Dominant metastatic lesion in the right hepatic lobe measures 9.9 x 7.1 cm on image 58/1101. Prior cholecystectomy noted. No evidence of biliary dilatation. Pancreas:  No mass or inflammatory changes. Spleen: A 2.5 x 3.1 cm homogeneous right adrenal mass is seen which has nonspecific characteristics. This mass does show restricted diffusion, which favors an adrenal metastasis over adrenal adenoma. Left adrenal gland is normal in appearance. No renal masses are identified.  No evidence of hydronephrosis. Adrenals/Urinary Tract: No masses identified. No evidence of hydronephrosis. Stomach/Bowel: Visualized portions within the abdomen are unremarkable. Vascular/Lymphatic: No pathologically enlarged lymph nodes identified. No abdominal aortic aneurysm. Other:  None. Musculoskeletal:  No suspicious bone lesions identified. IMPRESSION: Diffuse hypovascular liver metastases. 3 cm right adrenal mass, suspicious for metastasis although lipid-poor adrenal adenoma is a consideration. Electronically Signed   By: JEarle GellM.D.   On: 12/14/2016 18:36     Medications: Scheduled Meds: . aspirin EC  81 mg Oral Daily  . enoxaparin (LOVENOX) injection  40 mg Subcutaneous Q24H  . feeding supplement (GLUCERNA SHAKE)  237 mL Oral Q24H  . furosemide  40 mg Intravenous Q12H  . gabapentin  300 mg Oral QHS  . insulin aspart  0-15 Units Subcutaneous TID WC  . insulin aspart  0-5 Units Subcutaneous QHS  . insulin aspart  4 Units Subcutaneous  TID WC  . insulin glargine  20 Units Subcutaneous BID  . lisinopril  20 mg Oral Daily  . metoprolol tartrate  12.5 mg Oral BID  . potassium chloride  40 mEq Oral Daily  . protein supplement shake  11 oz Oral Q24H  . sodium chloride flush  3 mL Intravenous Q12H   Continuous Infusions: PRN Meds:.sodium chloride, acetaminophen, albuterol, dextromethorphan-guaiFENesin, fluticasone, hydrALAZINE, LORazepam,  ondansetron (ZOFRAN) IV, sodium chloride flush    Assessment/Plan: 71 y.o. female with diffuse metastatic appearing masses in her liver, ?adrenal met as well.  Need to find the primary site.  I will order CT scan chest, abd, pelvis.  She understands she will probably need further testing after that, pending the results.  She had colonoscopy 5-7 years ago; tells me a polyp was removed.  Depending on CT findings, she may need repeat colonoscopy, may need EGD.   Really depends on what CTs show.  I recommend oncology consultation as well.    Milus Banister, MD  12/15/2016, 7:21 AM [facility] Pager [phone]

## 2016-12-15 NOTE — Progress Notes (Signed)
PROGRESS NOTE    Mackenzie Park  VQM:[id number] DOB: 08-21-1946 DOA: 12/11/2016 PCP: Keith Rake, MD    Brief Narrative:  71 yo female with diastolic heart failure, presents with acute decompensation. 2 weeks progressive, weakness, dyspnea and lower extremities edema. Symptoms improving with diuresis, elevated LFTs [place] with liver masses. Gastroenterology consulted, requested CT chest, abdomen and pelvis, may need colonoscopy.    Assessment & Plan:   Principal Problem:   Acute on chronic diastolic CHF (congestive heart failure) (HCC) Active Problems:   Hypertension   Hyperlipidemia   Diabetes mellitus, type 2 (HCC)   Abnormal LFTs   CHF exacerbation ([place])   1. Decompensated diastolic heart failure acute on chronic decompensation. Patient responding well to diuresis, still reports orthopnea, will continue diuresis with furosemide 40 mg IV daily. Continue b blocker with metoprolol and ace inh with lisinopril. Urine output over last 24 hours 3,750 ml with total negative fluid balance of 5,598.4 ml. Continue to replete K with oral kcl.   2. Liver masses. Follow on gastroenterology recommendations, with CT chest, abdomen and pelvis. May need repeat colonoscopy.   3. HTN. Will continue blood pressure control with lisinopril and metoprolol, blood pressure systolic 90 to 932. Will decrease dose of furosemide to prevent hypotension.   4. T2DM. Continue glucose cover and monitoring, capillary glucose 192. 176, 212. Patient tolerating po well. On 20 units glargine bid and 4 units aspart premeal.   5. Dyslipidemia. Continue statin therapy.    DVT prophylaxis: enoxaparin  Code Status: full Family Communication: No family at the bedside  Disposition Plan: home    Consultants:   Gastroenterology   Procedures:    Antimicrobials:    Subjective: Dyspnea has been improving, no nausea or vomiting. No chest pain. Chronic right lower extremity edema.   Objective: Vitals:   12/14/16 0529  12/14/16 1200 12/14/16 2100 12/15/16 0449  BP: (!) 115/53 (!) 107/51 (!) 113/50 (!) 105/46  Pulse: 96 91 85 66  Resp: '18 18 18 18  '$ Temp: 97.8 F (36.6 C) 97.5 F (36.4 C) 98.1 F (36.7 C) 98.3 F (36.8 C)  TempSrc: Oral Oral Oral Oral  SpO2: 99% 97% 94% 98%  Weight: 101 kg (222 lb 11.2 oz)   101.7 kg (224 lb 4.8 oz)  Height:        Intake/Output Summary (Last 24 hours) at 12/15/16 0938 Last data filed at 12/15/16 0932  Gross per 24 hour  Intake           2763.6 ml  Output             3700 ml  Net           -936.4 ml   Filed Weights   12/13/16 0509 12/14/16 0529 12/15/16 0449  Weight: 103.4 kg (227 lb 14.4 oz) 101 kg (222 lb 11.2 oz) 101.7 kg (224 lb 4.8 oz)    Examination:  General exam: deconditioned E ENT: mild pallor, oral mucosa moist.  Respiratory system: Clear to auscultation. Respiratory effort normal. No wheezing, rales or rhonchi.  Cardiovascular system: S1 & S2 heard, RRR. No JVD, murmurs, rubs, gallops or clicks. ++ non pitting, more left than left edema. Gastrointestinal system: Abdomen is nondistended, soft and nontender. No organomegaly or masses felt. Normal bowel sounds heard. Central nervous system: Alert and oriented. No focal neurological deficits. Extremities: Symmetric 5 x 5 power. Skin: No rashes, lesions or ulcers     Data Reviewed: I have personally reviewed following labs and imaging studies  CBC:  Recent Labs Lab 12/09/16 0729 12/11/16 1750 12/13/16 0700 12/14/16 0321 12/14/16 1627  WBC 10.9 11.5* 12.4* 10.9* 15.0*  NEUTROABS  --   --   --   --  13.1*  HGB 12.5 11.8* 11.9* 10.4* 11.4*  HCT 37.5 36.9 36.4 32.4* 36.2  MCV 84.8 84.8 84.3 84.6 85.8  PLT 168 221 286 226 664   Basic Metabolic Panel:  Recent Labs Lab 12/09/16 0729 12/11/16 1750 12/12/16 0410 12/13/16 0700 12/14/16 0321 12/14/16 1405 12/15/16 0246  NA 134* 133*  --  135 137  --  138  K 4.0 3.7  --  2.9* 2.6* 3.6 3.2*  CL 93* 93*  --  89* 89*  --  94*  CO2 31  29  --  33* 38*  --  35*  GLUCOSE 313* 492*  --  379* 299*  --  159*  BUN 16 19  --  16 25*  --  30*  CREATININE 0.63 0.90  --  0.86 0.75  --  0.81  CALCIUM 9.0 9.0  --  9.0 8.8*  --  8.8*  MG  --   --  1.9  --   --   --  2.1   GFR: Estimated Creatinine Clearance: 79 mL/min (by C-G formula based on SCr of 0.81 mg/dL). Liver Function Tests:  Recent Labs Lab 12/09/16 0729 12/12/16 0021 12/13/16 0700 12/14/16 0321 12/15/16 0246  AST 114* 92* 132* 112* 142*  ALT 220* 203* 238* 228* 237*  ALKPHOS 89 94 100 89 89  BILITOT 1.8* 1.4* 1.0 0.6 0.8  PROT 6.4* 6.4* 6.5 5.4* 5.3*  ALBUMIN 3.1* 3.1* 3.3* 2.8* 2.9*    Recent Labs Lab 12/12/16 0021  LIPASE 56*   No results for input(s): AMMONIA in the last 168 hours. Coagulation Profile:  Recent Labs Lab 12/14/16 1405  INR 1.04   Cardiac Enzymes:  Recent Labs Lab 12/12/16 0405 12/12/16 1022 12/12/16 1556  TROPONINI 0.03* <0.03 <0.03   BNP (last 3 results) No results for input(s): PROBNP in the last 8760 hours. HbA1C: No results for input(s): HGBA1C in the last 72 hours. CBG:  Recent Labs Lab 12/14/16 0749 12/14/16 1205 12/14/16 1610 12/14/16 2122 12/15/16 0819  GLUCAP 341* 348* 172* 192* 176*   Lipid Profile: No results for input(s): CHOL, HDL, LDLCALC, TRIG, CHOLHDL, LDLDIRECT in the last 72 hours. Thyroid Function Tests: No results for input(s): TSH, T4TOTAL, FREET4, T3FREE, THYROIDAB in the last 72 hours. Anemia Panel: No results for input(s): VITAMINB12, FOLATE, FERRITIN, TIBC, IRON, RETICCTPCT in the last 72 hours. Sepsis Labs: No results for input(s): PROCALCITON, LATICACIDVEN in the last 168 hours.  Recent Results (from the past 240 hour(s))  Urine culture     Status: Abnormal   Collection Time: 12/11/16  6:02 PM  Result Value Ref Range Status   Specimen Description URINE, RANDOM  Final   Special Requests NONE  Final   Culture (A)  Final    >=100,000 COLONIES/mL GROUP B  STREP(S.AGALACTIAE)ISOLATED TESTING AGAINST S. AGALACTIAE NOT ROUTINELY PERFORMED DUE TO PREDICTABILITY OF AMP/PEN/VAN SUSCEPTIBILITY. 20,000 COLONIES/mL ESCHERICHIA COLI    Report Status 12/14/2016 FINAL  Final   Organism ID, Bacteria ESCHERICHIA COLI (A)  Final      Susceptibility   Escherichia coli - MIC*    AMPICILLIN <=2 SENSITIVE Sensitive     CEFAZOLIN <=4 SENSITIVE Sensitive     CEFTRIAXONE <=1 SENSITIVE Sensitive     CIPROFLOXACIN <=0.25 SENSITIVE Sensitive     GENTAMICIN <=1  SENSITIVE Sensitive     IMIPENEM <=0.25 SENSITIVE Sensitive     NITROFURANTOIN <=16 SENSITIVE Sensitive     TRIMETH/SULFA <=20 SENSITIVE Sensitive     AMPICILLIN/SULBACTAM <=2 SENSITIVE Sensitive     PIP/TAZO <=4 SENSITIVE Sensitive     Extended ESBL NEGATIVE Sensitive     * 20,000 COLONIES/mL ESCHERICHIA COLI         Radiology Studies: Mr Liver W Wo Contrast  Result Date: 12/14/2016 CLINICAL DATA:  Elevated liver function tests. Liver masses seen on recent ultrasound. EXAM: MRI ABDOMEN WITHOUT AND WITH CONTRAST TECHNIQUE: Multiplanar multisequence MR imaging of the abdomen was performed both before and after the administration of intravenous contrast. CONTRAST:  34m MULTIHANCE GADOBENATE DIMEGLUMINE 529 MG/ML IV SOLN COMPARISON:  Ultrasound on 12/12/2016 FINDINGS: Lower chest: No acute findings. Hepatobiliary: Innumerable hypovascular metastases are seen throughout the right and left hepatic lobes. Majority of the larger metastases show central necrosis. Dominant metastatic lesion in the right hepatic lobe measures 9.9 x 7.1 cm on image 58/1101. Prior cholecystectomy noted. No evidence of biliary dilatation. Pancreas:  No mass or inflammatory changes. Spleen: A 2.5 x 3.1 cm homogeneous right adrenal mass is seen which has nonspecific characteristics. This mass does show restricted diffusion, which favors an adrenal metastasis over adrenal adenoma. Left adrenal gland is normal in appearance. No renal  masses are identified.  No evidence of hydronephrosis. Adrenals/Urinary Tract: No masses identified. No evidence of hydronephrosis. Stomach/Bowel: Visualized portions within the abdomen are unremarkable. Vascular/Lymphatic: No pathologically enlarged lymph nodes identified. No abdominal aortic aneurysm. Other:  None. Musculoskeletal:  No suspicious bone lesions identified. IMPRESSION: Diffuse hypovascular liver metastases. 3 cm right adrenal mass, suspicious for metastasis although lipid-poor adrenal adenoma is a consideration. Electronically Signed   By: JEarle GellM.D.   On: 12/14/2016 18:36        Scheduled Meds: . aspirin EC  81 mg Oral Daily  . enoxaparin (LOVENOX) injection  40 mg Subcutaneous Q24H  . feeding supplement (GLUCERNA SHAKE)  237 mL Oral Q24H  . furosemide  40 mg Intravenous Q12H  . gabapentin  300 mg Oral QHS  . insulin aspart  0-15 Units Subcutaneous TID WC  . insulin aspart  0-5 Units Subcutaneous QHS  . insulin aspart  4 Units Subcutaneous TID WC  . insulin glargine  20 Units Subcutaneous BID  . lisinopril  20 mg Oral Daily  . metoprolol tartrate  12.5 mg Oral BID  . potassium chloride  40 mEq Oral Daily  . protein supplement shake  11 oz Oral Q24H  . sodium chloride flush  3 mL Intravenous Q12H   Continuous Infusions:   LOS: 3 days      Tandy Grawe DGerome Apley MD [facility] Pager [phone] If 7PM-7AM, please contact night-coverage [email or web address] Password [email or web address]4/08/2017, 9:38 AM

## 2016-12-15 NOTE — Progress Notes (Signed)
Per Pt bath has already been done

## 2016-12-16 LAB — GLUCOSE, CAPILLARY
GLUCOSE-CAPILLARY: 218 mg/dL — AB (ref 65–99)
GLUCOSE-CAPILLARY: 258 mg/dL — AB (ref 65–99)
GLUCOSE-CAPILLARY: 142 mg/dL — AB (ref 65–99)
Glucose-Capillary: 182 mg/dL — ABNORMAL HIGH (ref 65–99)

## 2016-12-16 LAB — BASIC METABOLIC PANEL
ANION GAP: 12 (ref 5–15)
BUN: 32 mg/dL — ABNORMAL HIGH (ref 6–20)
CHLORIDE: 93 mmol/L — AB (ref 101–111)
CO2: 34 mmol/L — AB (ref 22–32)
CREATININE: 0.69 mg/dL (ref 0.44–1.00)
Calcium: 9 mg/dL (ref 8.9–10.3)
GFR calc non Af Amer: >60 mL/min (ref >60)
Glucose, Bld: 174 mg/dL — ABNORMAL HIGH (ref 65–99)
POTASSIUM: 3.3 mmol/L — AB (ref 3.5–5.1)
Sodium: 139 mmol/L (ref 135–145)

## 2016-12-16 MED ORDER — FUROSEMIDE 10 MG/ML IJ SOLN
20.0000 mg | Freq: Every day | INTRAMUSCULAR | Status: DC
  Administered 2016-12-17: 20 mg via INTRAVENOUS
  Filled 2016-12-16: qty 2

## 2016-12-16 MED ORDER — POTASSIUM CHLORIDE 20 MEQ/15ML (10%) PO SOLN
40.0000 meq | Freq: Every day | ORAL | Status: DC
  Administered 2016-12-17: 40 meq via ORAL
  Filled 2016-12-16: qty 30

## 2016-12-16 MED ORDER — ENOXAPARIN SODIUM 40 MG/0.4ML ~~LOC~~ SOLN: 40.0000 mg | SUBCUTANEOUS | Status: DC

## 2016-12-16 NOTE — Care Management Important Message (Signed)
Important Message  Patient Details  Name: Mackenzie Park MRN: [id number] Date of Birth: Jan 14, 1946   Medicare Important Message Given:  Yes    Orbie Pyo 12/16/2016, 2:17 PM

## 2016-12-16 NOTE — Progress Notes (Signed)
PROGRESS NOTE    Mackenzie Park  AQT:[id number] DOB: 1946-03-23 DOA: 12/11/2016 PCP: Keith Rake, MD    Brief Narrative:  71 yo female with diastolic heart failure, presents with acute decompensation. 2 weeks progressive, weakness, dyspnea and lower extremities edema. Symptoms improving with diuresis, elevated LFTs [place] with liver masses. Gastroenterology consulted, requested CT chest/ abdomen, noted hilar lymphadenopathy and large hepatic masses. IR consulted for biopsy.    Assessment & Plan:   Principal Problem:   Acute on chronic diastolic CHF (congestive heart failure) (HCC) Active Problems:   Hypertension   Hyperlipidemia   Diabetes mellitus, type 2 (HCC)   Abnormal LFTs   CHF exacerbation ([place])   1. Decompensated diastolic heart failure acute on chronic decompensation. Continue furosemide IV 20 mg daily,  b blocker with metoprolol and ace inh with lisinopril. Urine output over last 24 hours 2,600 ml with total negative fluid balance of 6,446.4 ml. Close to eu-volemia.   2. Liver masses suspected small cell lung cancer. CT with large right hilar lymphadenopathy with large liver masses and right adrenal gland metastatsis, suggestive for samll cell lung cancer. Will consult IR for liver biopsy, if not successful will need endobronchial US guided biopsy.    3. HTN. Continue blood pressure control with lisinopril and metoprolol, blood pressure systolic 562 to 563. Continue gentle diuresis.   4. T2DM. Continue glucose cover and monitoring, capillary glucose 157, 218, 258. Patient tolerating po well. On 20 units glargine bid and 4 units aspart premeal, plus insulin sliding scale.       DVT prophylaxis: enoxaparin  Code Status: full Family Communication: No family at the bedside  Disposition Plan: home    Consultants:   Gastroenterology   Procedures:    Antimicrobials:     Subjective: Patient with stable dyspnea, no chest pain. Positive nausea and vomiting, no  abdominal pain or diarrhea.   Objective: Vitals:   12/15/16 1225 12/15/16 1953 12/16/16 0358 12/16/16 0900  BP: (!) 96/39 (!) 117/57 (!) 116/59 (!) 126/56  Pulse: 77 83 86 (!) 106  Resp: '20 18 18   '$ Temp: 98.2 F (36.8 C) 97.7 F (36.5 C) 97.9 F (36.6 C) 97.6 F (36.4 C)  TempSrc: Oral Oral Oral Oral  SpO2: 98% 94% 96% 99%  Weight:   101.2 kg (223 lb 1.6 oz)   Height:        Intake/Output Summary (Last 24 hours) at 12/16/16 1129 Last data filed at 12/16/16 0913  Gross per 24 hour  Intake             1400 ml  Output             1450 ml  Net              -50 ml   Filed Weights   12/14/16 0529 12/15/16 0449 12/16/16 0358  Weight: 101 kg (222 lb 11.2 oz) 101.7 kg (224 lb 4.8 oz) 101.2 kg (223 lb 1.6 oz)    Examination:  General exam: deconditioned E ENT: Mild pallor, no icterus, oral mucosa moist. Respiratory system: Mild rales at bases. Respiratory effort normal. No wheezing or rhonchi.  Cardiovascular system: S1 & S2 heard, RRR. No JVD, murmurs, rubs, gallops or clicks. No pedal edema. Gastrointestinal system: Abdomen is nondistended, soft and nontender. No organomegaly or masses felt. Normal bowel sounds heard. Central nervous system: Alert and oriented. No focal neurological deficits. Extremities: Symmetric 5 x 5 power. Skin: No rashes, lesions or ulcers  Data Reviewed: I have personally reviewed following labs and imaging studies  CBC:  Recent Labs Lab 12/11/16 1750 12/13/16 0700 12/14/16 0321 12/14/16 1627  WBC 11.5* 12.4* 10.9* 15.0*  NEUTROABS  --   --   --  13.1*  HGB 11.8* 11.9* 10.4* 11.4*  HCT 36.9 36.4 32.4* 36.2  MCV 84.8 84.3 84.6 85.8  PLT 221 286 226 941   Basic Metabolic Panel:  Recent Labs Lab 12/11/16 1750 12/12/16 0410 12/13/16 0700 12/14/16 0321 12/14/16 1405 12/15/16 0246 12/16/16 0343  NA 133*  --  135 137  --  138 139  K 3.7  --  2.9* 2.6* 3.6 3.2* 3.3*  CL 93*  --  89* 89*  --  94* 93*  CO2 29  --  33* 38*  --  35*  34*  GLUCOSE 492*  --  379* 299*  --  159* 174*  BUN 19  --  16 25*  --  30* 32*  CREATININE 0.90  --  0.86 0.75  --  0.81 0.69  CALCIUM 9.0  --  9.0 8.8*  --  8.8* 9.0  MG  --  1.9  --   --   --  2.1  --    GFR: Estimated Creatinine Clearance: 79.7 mL/min (by C-G formula based on SCr of 0.69 mg/dL). Liver Function Tests:  Recent Labs Lab 12/12/16 0021 12/13/16 0700 12/14/16 0321 12/15/16 0246  AST 92* 132* 112* 142*  ALT 203* 238* 228* 237*  ALKPHOS 94 100 89 89  BILITOT 1.4* 1.0 0.6 0.8  PROT 6.4* 6.5 5.4* 5.3*  ALBUMIN 3.1* 3.3* 2.8* 2.9*    Recent Labs Lab 12/12/16 0021  LIPASE 56*   No results for input(s): AMMONIA in the last 168 hours. Coagulation Profile:  Recent Labs Lab 12/14/16 1405  INR 1.04   Cardiac Enzymes:  Recent Labs Lab 12/12/16 0405 12/12/16 1022 12/12/16 1556  TROPONINI 0.03* <0.03 <0.03   BNP (last 3 results) No results for input(s): PROBNP in the last 8760 hours. HbA1C: No results for input(s): HGBA1C in the last 72 hours. CBG:  Recent Labs Lab 12/15/16 1222 12/15/16 1609 12/15/16 2056 12/16/16 0742 12/16/16 1121  GLUCAP 212* 153* 157* 218* 258*   Lipid Profile: No results for input(s): CHOL, HDL, LDLCALC, TRIG, CHOLHDL, LDLDIRECT in the last 72 hours. Thyroid Function Tests: No results for input(s): TSH, T4TOTAL, FREET4, T3FREE, THYROIDAB in the last 72 hours. Anemia Panel: No results for input(s): VITAMINB12, FOLATE, FERRITIN, TIBC, IRON, RETICCTPCT in the last 72 hours. Sepsis Labs: No results for input(s): PROCALCITON, LATICACIDVEN in the last 168 hours.  Recent Results (from the past 240 hour(s))  Urine culture     Status: Abnormal   Collection Time: 12/11/16  6:02 PM  Result Value Ref Range Status   Specimen Description URINE, RANDOM  Final   Special Requests NONE  Final   Culture (A)  Final    >=100,000 COLONIES/mL GROUP B STREP(S.AGALACTIAE)ISOLATED TESTING AGAINST S. AGALACTIAE NOT ROUTINELY PERFORMED DUE  TO PREDICTABILITY OF AMP/PEN/VAN SUSCEPTIBILITY. 20,000 COLONIES/mL ESCHERICHIA COLI    Report Status 12/14/2016 FINAL  Final   Organism ID, Bacteria ESCHERICHIA COLI (A)  Final      Susceptibility   Escherichia coli - MIC*    AMPICILLIN <=2 SENSITIVE Sensitive     CEFAZOLIN <=4 SENSITIVE Sensitive     CEFTRIAXONE <=1 SENSITIVE Sensitive     CIPROFLOXACIN <=0.25 SENSITIVE Sensitive     GENTAMICIN <=1 SENSITIVE Sensitive  IMIPENEM <=0.25 SENSITIVE Sensitive     NITROFURANTOIN <=16 SENSITIVE Sensitive     TRIMETH/SULFA <=20 SENSITIVE Sensitive     AMPICILLIN/SULBACTAM <=2 SENSITIVE Sensitive     PIP/TAZO <=4 SENSITIVE Sensitive     Extended ESBL NEGATIVE Sensitive     * 20,000 COLONIES/mL ESCHERICHIA COLI         Radiology Studies: Ct Chest W Contrast  Result Date: 12/16/2016 CLINICAL DATA:  Liver mass on ultrasound and MRI. Elevated liver enzymes. EXAM: CT ABDOMEN AND PELVIS WITH CONTRAST TECHNIQUE: Multidetector CT imaging of the abdomen and pelvis was performed using the standard protocol following bolus administration of intravenous contrast. CONTRAST:  8m ISOVUE-300 IOPAMIDOL (ISOVUE-300) INJECTION 61% COMPARISON:  MRI 12/14/2016 FINDINGS: CT CHEST FINDINGS Cardiovascular: No significant vascular findings. Normal heart size. No pericardial effusion. Mediastinum/Nodes: No axillary supraclavicular adenopathy. Small 15 mm nodule in the LEFT lobe of thyroid gland. Enlarged RIGHT lower paratracheal lymph nodes which extend into the RIGHT hilum. RIGHT lower paratracheal lymph node measures 18 mm short axis. RIGHT hilar lymph node measures 16 mm. There is postobstructive atelectasis of the RIGHT middle lobe associated with this hilar adenopathy/mass. Lungs/Pleura: RIGHT middle lobe atelectasis. Linear nodular thickening adjacent to the atelectasis measuring 21 by 19 mm (image 55, series 8). Small peripheral nodule measuring 8 mm on image 57, series 5. Musculoskeletal: No aggressive  osseous lesion. CT ABDOMEN AND PELVIS FINDINGS Hepatobiliary: Multiple low-attenuation lesions with peripheral enhancement within LEFT and RIGHT hepatic lobes as described on comparison MRI. Central abdominal lesion measures 53 mm (image 44, series 3). Large RIGHT hepatic lobe lesion measures 84 mm x 55 mm. LEFT lateral hepatic lobe lesion measures 27 by 27 mm image 55, series 3. There are approximately 30 lesions within liver. No biliary duct dilatation. Postcholecystectomy. Common bile duct is dilated to 12 mm presumably related to cholecystectomy. Pancreas: Pancreas is normal. No ductal dilatation. No pancreatic inflammation. Spleen: Normal spleen Adrenals/urinary tract: RIGHT adrenal gland is enlarged to 22 by 31 mm and enhances. Kidneys ureters normal. Bladder normal. Stomach/Bowel: Post gastric bypass anatomy. No bowel obstruction. Appendix normal. Several diverticula of the colon without acute inflammation. Vascular/Lymphatic: Abdominal aorta is normal caliber. There is no retroperitoneal or periportal lymphadenopathy. No pelvic lymphadenopathy. Reproductive: Uterus and ovaries normal Other: No free fluid. Musculoskeletal: No aggressive osseous lesion. IMPRESSION: Chest Impression: 1. RIGHT hilar adenopathy / mass with postobstructive collapse of the RIGHT middle lobe is suggestive of SMALL CELL LUNG CANCER. 2. Nodular lesion in the RIGHT middle lobe adjacent to the atelectasis may represent primary malignancy. 3. Enlarged RIGHT paratracheal metastatic adenopathy. Abdomen / Pelvis Impression: 1. Multiple hepatic metastasis as described on comparison MRI. 2. RIGHT adrenal gland metastasis. 3. No metastatic adenopathy in the abdomen pelvis. 4. Postsurgical change consistent with cholecystectomy and bariatric surgery Electronically Signed   By: SSuzy BouchardM.D.   On: 12/16/2016 10:24   Ct Abdomen Pelvis W Contrast  Result Date: 12/16/2016 CLINICAL DATA:  Liver mass on ultrasound and MRI. Elevated liver  enzymes. EXAM: CT ABDOMEN AND PELVIS WITH CONTRAST TECHNIQUE: Multidetector CT imaging of the abdomen and pelvis was performed using the standard protocol following bolus administration of intravenous contrast. CONTRAST:  7109mISOVUE-300 IOPAMIDOL (ISOVUE-300) INJECTION 61% COMPARISON:  MRI 12/14/2016 FINDINGS: CT CHEST FINDINGS Cardiovascular: No significant vascular findings. Normal heart size. No pericardial effusion. Mediastinum/Nodes: No axillary supraclavicular adenopathy. Small 15 mm nodule in the LEFT lobe of thyroid gland. Enlarged RIGHT lower paratracheal lymph nodes which extend into the RIGHT hilum. RIGHT  lower paratracheal lymph node measures 18 mm short axis. RIGHT hilar lymph node measures 16 mm. There is postobstructive atelectasis of the RIGHT middle lobe associated with this hilar adenopathy/mass. Lungs/Pleura: RIGHT middle lobe atelectasis. Linear nodular thickening adjacent to the atelectasis measuring 21 by 19 mm (image 55, series 8). Small peripheral nodule measuring 8 mm on image 57, series 5. Musculoskeletal: No aggressive osseous lesion. CT ABDOMEN AND PELVIS FINDINGS Hepatobiliary: Multiple low-attenuation lesions with peripheral enhancement within LEFT and RIGHT hepatic lobes as described on comparison MRI. Central abdominal lesion measures 53 mm (image 44, series 3). Large RIGHT hepatic lobe lesion measures 84 mm x 55 mm. LEFT lateral hepatic lobe lesion measures 27 by 27 mm image 55, series 3. There are approximately 30 lesions within liver. No biliary duct dilatation. Postcholecystectomy. Common bile duct is dilated to 12 mm presumably related to cholecystectomy. Pancreas: Pancreas is normal. No ductal dilatation. No pancreatic inflammation. Spleen: Normal spleen Adrenals/urinary tract: RIGHT adrenal gland is enlarged to 22 by 31 mm and enhances. Kidneys ureters normal. Bladder normal. Stomach/Bowel: Post gastric bypass anatomy. No bowel obstruction. Appendix normal. Several diverticula  of the colon without acute inflammation. Vascular/Lymphatic: Abdominal aorta is normal caliber. There is no retroperitoneal or periportal lymphadenopathy. No pelvic lymphadenopathy. Reproductive: Uterus and ovaries normal Other: No free fluid. Musculoskeletal: No aggressive osseous lesion. IMPRESSION: Chest Impression: 1. RIGHT hilar adenopathy / mass with postobstructive collapse of the RIGHT middle lobe is suggestive of SMALL CELL LUNG CANCER. 2. Nodular lesion in the RIGHT middle lobe adjacent to the atelectasis may represent primary malignancy. 3. Enlarged RIGHT paratracheal metastatic adenopathy. Abdomen / Pelvis Impression: 1. Multiple hepatic metastasis as described on comparison MRI. 2. RIGHT adrenal gland metastasis. 3. No metastatic adenopathy in the abdomen pelvis. 4. Postsurgical change consistent with cholecystectomy and bariatric surgery Electronically Signed   By: Suzy Bouchard M.D.   On: 12/16/2016 10:24   Mr Liver W HT Contrast  Result Date: 12/14/2016 CLINICAL DATA:  Elevated liver function tests. Liver masses seen on recent ultrasound. EXAM: MRI ABDOMEN WITHOUT AND WITH CONTRAST TECHNIQUE: Multiplanar multisequence MR imaging of the abdomen was performed both before and after the administration of intravenous contrast. CONTRAST:  27m MULTIHANCE GADOBENATE DIMEGLUMINE 529 MG/ML IV SOLN COMPARISON:  Ultrasound on 12/12/2016 FINDINGS: Lower chest: No acute findings. Hepatobiliary: Innumerable hypovascular metastases are seen throughout the right and left hepatic lobes. Majority of the larger metastases show central necrosis. Dominant metastatic lesion in the right hepatic lobe measures 9.9 x 7.1 cm on image 58/1101. Prior cholecystectomy noted. No evidence of biliary dilatation. Pancreas:  No mass or inflammatory changes. Spleen: A 2.5 x 3.1 cm homogeneous right adrenal mass is seen which has nonspecific characteristics. This mass does show restricted diffusion, which favors an adrenal  metastasis over adrenal adenoma. Left adrenal gland is normal in appearance. No renal masses are identified.  No evidence of hydronephrosis. Adrenals/Urinary Tract: No masses identified. No evidence of hydronephrosis. Stomach/Bowel: Visualized portions within the abdomen are unremarkable. Vascular/Lymphatic: No pathologically enlarged lymph nodes identified. No abdominal aortic aneurysm. Other:  None. Musculoskeletal:  No suspicious bone lesions identified. IMPRESSION: Diffuse hypovascular liver metastases. 3 cm right adrenal mass, suspicious for metastasis although lipid-poor adrenal adenoma is a consideration. Electronically Signed   By: JEarle GellM.D.   On: 12/14/2016 18:36        Scheduled Meds: . aspirin EC  81 mg Oral Daily  . enoxaparin (LOVENOX) injection  40 mg Subcutaneous Q24H  . feeding supplement (GLUCERNA  SHAKE)  237 mL Oral Q24H  . furosemide  40 mg Intravenous Daily  . gabapentin  300 mg Oral QHS  . insulin aspart  0-15 Units Subcutaneous TID WC  . insulin aspart  0-5 Units Subcutaneous QHS  . insulin aspart  4 Units Subcutaneous TID WC  . insulin glargine  20 Units Subcutaneous BID  . lisinopril  20 mg Oral Daily  . metoprolol tartrate  12.5 mg Oral BID  . [START ON 12/17/2016] potassium chloride  40 mEq Oral Daily  . protein supplement shake  11 oz Oral Q24H  . sodium chloride flush  3 mL Intravenous Q12H   Continuous Infusions:   LOS: 4 days      Liev Brockbank Gerome Apley, MD [facility] Pager [phone]  If 7PM-7AM, please contact night-coverage [email or web address] Password [email or web address] 12/16/2016, 11:29 AM

## 2016-12-16 NOTE — Progress Notes (Signed)
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  I explained this to her. She feels very weak overall.    Objective: Vital signs in last 24 hours: Temp:  [97.6 F (36.4 C)-98.2 F (36.8 C)] 97.6 F (36.4 C) (04/13 0900) Pulse Rate:  [77-106] 106 (04/13 0900) Resp:  [18-20] 18 (04/13 0358) BP: (96-126)/(39-59) 126/56 (04/13 0900) SpO2:  [94 %-99 %] 99 % (04/13 0900) Weight:  [223 lb 1.6 oz (101.2 kg)] 223 lb 1.6 oz (101.2 kg) (04/13 0358) Last BM Date: 12/15/16 General: alert and oriented times 3 Heart: regular rate and rythm Abdomen: soft, non-tender, non-distended, normal bowel sounds   Lab Results:  Recent Labs  12/14/16 0321 12/14/16 1627  WBC 10.9* 15.0*  HGB 10.4* 11.4*  PLT 226 305  MCV 84.6 85.8    Recent Labs  12/14/16 0321 12/14/16 1405 12/15/16 0246 12/16/16 0343  NA 137  --  138 139  K 2.6* 3.6 3.2* 3.3*  CL 89*  --  94* 93*  CO2 38*  --  35* 34*  GLUCOSE 299*  --  159* 174*  BUN 25*  --  30* 32*  CREATININE 0.75  --  0.81 0.69  CALCIUM 8.8*  --  8.8* 9.0    Recent Labs  12/14/16 0321 12/15/16 0246  PROT 5.4* 5.3*  ALBUMIN 2.8* 2.9*  AST 112* 142*  ALT 228* 237*  ALKPHOS 89 89  BILITOT 0.6 0.8    Recent Labs  12/14/16 1405  INR 1.04     Studies/Results: Ct Chest W Contrast  Result Date: 12/16/2016 CLINICAL DATA:  Liver mass on ultrasound and MRI. Elevated liver enzymes. EXAM: CT ABDOMEN AND PELVIS WITH CONTRAST TECHNIQUE: Multidetector CT imaging of the abdomen and pelvis was performed using the standard protocol following bolus administration of intravenous contrast. CONTRAST:  30m ISOVUE-300 IOPAMIDOL (ISOVUE-300) INJECTION 61% COMPARISON:  MRI 12/14/2016 FINDINGS: CT CHEST FINDINGS Cardiovascular: No significant vascular findings. Normal heart size. No pericardial effusion. Mediastinum/Nodes: No axillary supraclavicular adenopathy. Small 15 mm  nodule in the LEFT lobe of thyroid gland. Enlarged RIGHT lower paratracheal lymph nodes which extend into the RIGHT hilum. RIGHT lower paratracheal lymph node measures 18 mm short axis. RIGHT hilar lymph node measures 16 mm. There is postobstructive atelectasis of the RIGHT middle lobe associated with this hilar adenopathy/mass. Lungs/Pleura: RIGHT middle lobe atelectasis. Linear nodular thickening adjacent to the atelectasis measuring 21 by 19 mm (image 55, series 8). Small peripheral nodule measuring 8 mm on image 57, series 5. Musculoskeletal: No aggressive osseous lesion. CT ABDOMEN AND PELVIS FINDINGS Hepatobiliary: Multiple low-attenuation lesions with peripheral enhancement within LEFT and RIGHT hepatic lobes as described on comparison MRI. Central abdominal lesion measures 53 mm (image 44, series 3). Large RIGHT hepatic lobe lesion measures 84 mm x 55 mm. LEFT lateral hepatic lobe lesion measures 27 by 27 mm image 55, series 3. There are approximately 30 lesions within liver. No biliary duct dilatation. Postcholecystectomy. Common bile duct is dilated to 12 mm presumably related to cholecystectomy. Pancreas: Pancreas is normal. No ductal dilatation. No pancreatic inflammation. Spleen: Normal spleen Adrenals/urinary tract: RIGHT adrenal gland is enlarged to 22 by 31 mm and enhances. Kidneys ureters normal. Bladder normal. Stomach/Bowel: Post gastric bypass anatomy. No bowel obstruction. Appendix normal. Several diverticula of the colon without acute inflammation. Vascular/Lymphatic: Abdominal aorta is normal caliber. There is no retroperitoneal or periportal lymphadenopathy. No pelvic lymphadenopathy. Reproductive: Uterus and ovaries  normal Other: No free fluid. Musculoskeletal: No aggressive osseous lesion. IMPRESSION: Chest Impression: 1. RIGHT hilar adenopathy / mass with postobstructive collapse of the RIGHT middle lobe is suggestive of SMALL CELL LUNG CANCER. 2. Nodular lesion in the RIGHT middle lobe  adjacent to the atelectasis may represent primary malignancy. 3. Enlarged RIGHT paratracheal metastatic adenopathy. Abdomen / Pelvis Impression: 1. Multiple hepatic metastasis as described on comparison MRI. 2. RIGHT adrenal gland metastasis. 3. No metastatic adenopathy in the abdomen pelvis. 4. Postsurgical change consistent with cholecystectomy and bariatric surgery Electronically Signed   By: Suzy Bouchard M.D.   On: 12/16/2016 10:24   Ct Abdomen Pelvis W Contrast  Result Date: 12/16/2016 CLINICAL DATA:  Liver mass on ultrasound and MRI. Elevated liver enzymes. EXAM: CT ABDOMEN AND PELVIS WITH CONTRAST TECHNIQUE: Multidetector CT imaging of the abdomen and pelvis was performed using the standard protocol following bolus administration of intravenous contrast. CONTRAST:  37m ISOVUE-300 IOPAMIDOL (ISOVUE-300) INJECTION 61% COMPARISON:  MRI 12/14/2016 FINDINGS: CT CHEST FINDINGS Cardiovascular: No significant vascular findings. Normal heart size. No pericardial effusion. Mediastinum/Nodes: No axillary supraclavicular adenopathy. Small 15 mm nodule in the LEFT lobe of thyroid gland. Enlarged RIGHT lower paratracheal lymph nodes which extend into the RIGHT hilum. RIGHT lower paratracheal lymph node measures 18 mm short axis. RIGHT hilar lymph node measures 16 mm. There is postobstructive atelectasis of the RIGHT middle lobe associated with this hilar adenopathy/mass. Lungs/Pleura: RIGHT middle lobe atelectasis. Linear nodular thickening adjacent to the atelectasis measuring 21 by 19 mm (image 55, series 8). Small peripheral nodule measuring 8 mm on image 57, series 5. Musculoskeletal: No aggressive osseous lesion. CT ABDOMEN AND PELVIS FINDINGS Hepatobiliary: Multiple low-attenuation lesions with peripheral enhancement within LEFT and RIGHT hepatic lobes as described on comparison MRI. Central abdominal lesion measures 53 mm (image 44, series 3). Large RIGHT hepatic lobe lesion measures 84 mm x 55 mm. LEFT  lateral hepatic lobe lesion measures 27 by 27 mm image 55, series 3. There are approximately 30 lesions within liver. No biliary duct dilatation. Postcholecystectomy. Common bile duct is dilated to 12 mm presumably related to cholecystectomy. Pancreas: Pancreas is normal. No ductal dilatation. No pancreatic inflammation. Spleen: Normal spleen Adrenals/urinary tract: RIGHT adrenal gland is enlarged to 22 by 31 mm and enhances. Kidneys ureters normal. Bladder normal. Stomach/Bowel: Post gastric bypass anatomy. No bowel obstruction. Appendix normal. Several diverticula of the colon without acute inflammation. Vascular/Lymphatic: Abdominal aorta is normal caliber. There is no retroperitoneal or periportal lymphadenopathy. No pelvic lymphadenopathy. Reproductive: Uterus and ovaries normal Other: No free fluid. Musculoskeletal: No aggressive osseous lesion. IMPRESSION: Chest Impression: 1. RIGHT hilar adenopathy / mass with postobstructive collapse of the RIGHT middle lobe is suggestive of SMALL CELL LUNG CANCER. 2. Nodular lesion in the RIGHT middle lobe adjacent to the atelectasis may represent primary malignancy. 3. Enlarged RIGHT paratracheal metastatic adenopathy. Abdomen / Pelvis Impression: 1. Multiple hepatic metastasis as described on comparison MRI. 2. RIGHT adrenal gland metastasis. 3. No metastatic adenopathy in the abdomen pelvis. 4. Postsurgical change consistent with cholecystectomy and bariatric surgery Electronically Signed   By: SSuzy BouchardM.D.   On: 12/16/2016 10:24   Mr Liver W WTKContrast  Result Date: 12/14/2016 CLINICAL DATA:  Elevated liver function tests. Liver masses seen on recent ultrasound. EXAM: MRI ABDOMEN WITHOUT AND WITH CONTRAST TECHNIQUE: Multiplanar multisequence MR imaging of the abdomen was performed both before and after the administration of intravenous contrast. CONTRAST:  281mMULTIHANCE GADOBENATE DIMEGLUMINE 529 MG/ML IV SOLN COMPARISON:  Ultrasound on 12/12/2016  FINDINGS: Lower chest: No acute findings. Hepatobiliary: Innumerable hypovascular metastases are seen throughout the right and left hepatic lobes. Majority of the larger metastases show central necrosis. Dominant metastatic lesion in the right hepatic lobe measures 9.9 x 7.1 cm on image 58/1101. Prior cholecystectomy noted. No evidence of biliary dilatation. Pancreas:  No mass or inflammatory changes. Spleen: A 2.5 x 3.1 cm homogeneous right adrenal mass is seen which has nonspecific characteristics. This mass does show restricted diffusion, which favors an adrenal metastasis over adrenal adenoma. Left adrenal gland is normal in appearance. No renal masses are identified.  No evidence of hydronephrosis. Adrenals/Urinary Tract: No masses identified. No evidence of hydronephrosis. Stomach/Bowel: Visualized portions within the abdomen are unremarkable. Vascular/Lymphatic: No pathologically enlarged lymph nodes identified. No abdominal aortic aneurysm. Other:  None. Musculoskeletal:  No suspicious bone lesions identified. IMPRESSION: Diffuse hypovascular liver metastases. 3 cm right adrenal mass, suspicious for metastasis although lipid-poor adrenal adenoma is a consideration. Electronically Signed   By: Earle Gell M.D.   On: 12/14/2016 18:36     Medications: Scheduled Meds: . aspirin EC  81 mg Oral Daily  . enoxaparin (LOVENOX) injection  40 mg Subcutaneous Q24H  . feeding supplement (GLUCERNA SHAKE)  237 mL Oral Q24H  . furosemide  40 mg Intravenous Daily  . gabapentin  300 mg Oral QHS  . insulin aspart  0-15 Units Subcutaneous TID WC  . insulin aspart  0-5 Units Subcutaneous QHS  . insulin aspart  4 Units Subcutaneous TID WC  . insulin glargine  20 Units Subcutaneous BID  . lisinopril  20 mg Oral Daily  . metoprolol tartrate  12.5 mg Oral BID  . [START ON 12/17/2016] potassium chloride  40 mEq Oral Daily  . protein supplement shake  11 oz Oral Q24H  . sodium chloride flush  3 mL Intravenous Q12H    Continuous Infusions: PRN Meds:.sodium chloride, acetaminophen, albuterol, dextromethorphan-guaiFENesin, fluticasone, hydrALAZINE, LORazepam, ondansetron (ZOFRAN) IV, sodium chloride flush    Assessment/Plan: 71 y.o. 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  I suggest oncology consultation and US guided biopsy of one of the liver masses.  Will sign off for now.  If further testing points away from lung and these is concern for luminal GI cancer the I'm happy to see her again.  Please call or page with any further questions or concerns.   Milus Banister, MD  12/16/2016, 10:38 AM [facility] Pager [phone]

## 2016-12-16 NOTE — Consult Note (Signed)
Chief Complaint: Patient was seen in consultation today for liver lesion biopsy Chief Complaint  Patient presents with  . Hyperglycemia  . Weakness  . Urinary Retention   at the request of Dr Lurline Del  Referring Physician(s): Dr Rande Brunt  Supervising Physician: Sandi Mariscal  Patient Status: [facility] - Out-pt  History of Present Illness: [facility] is a 71 y.o. female   Diag heart failure Decompensation 2 weeks weakness; dyspnea; lower extr edema Work up shows elevated LFTs with liver masses  CT 4/12:  IMPRESSION: Chest Impression: 1. RIGHT hilar adenopathy / mass with postobstructive collapse of the RIGHT middle lobe is suggestive of SMALL CELL LUNG CANCER. 2. Nodular lesion in the RIGHT middle lobe adjacent to the atelectasis may represent primary malignancy. 3. Enlarged RIGHT paratracheal metastatic adenopathy.  Abdomen / Pelvis Impression: 1. Multiple hepatic metastasis as described on comparison MRI. 2. RIGHT adrenal gland metastasis. 3. No metastatic adenopathy in the abdomen pelvis. 4. Postsurgical change consistent with cholecystectomy and bariatric surgery  Request for hepatic lesion biopsy  Imaging reviewed with Dr Vira Blanco procedure Plan for Monday 4/16  Past Medical History:  Diagnosis Date  . Allergic rhinitis   . CHF (congestive heart failure) ([place])   . Depressive disorder   . Diabetes mellitus ([place])   . Hypercholesteremia   . Hypertension   . Lumbosacral neuritis   . Osteoarthritis     Past Surgical History:  Procedure Laterality Date  . 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 at [facility].   . REPLACEMENT TOTAL KNEE BILATERAL      Allergies: Sulfa antibiotics  Medications: Prior to Admission medications   Medication Sig Start Date End Date Taking? Authorizing Provider  aspirin EC 81 MG tablet Take 81 mg by mouth daily.   Yes Historical Provider, MD  fluticasone (FLONASE) 50 MCG/ACT nasal spray Place 1-2  sprays into both nostrils daily as needed for allergies.    Yes Historical Provider, MD  gabapentin (NEURONTIN) 300 MG capsule Take 1 capsule (300 mg total) by mouth at bedtime. 11/08/16  Yes Roselee Nova, MD  lisinopril-hydrochlorothiazide (PRINZIDE,ZESTORETIC) 20-12.5 MG tablet Take 1 tablet by mouth daily. 11/08/16  Yes Roselee Nova, MD  meloxicam (MOBIC) 15 MG tablet Take 1 tablet (15 mg total) by mouth daily. 11/08/16  Yes Roselee Nova, MD  sitaGLIPtin (JANUVIA) 100 MG tablet Take 1 tablet (100 mg total) by mouth daily. 11/16/16  Yes Roselee Nova, MD  atorvastatin (LIPITOR) 20 MG tablet Take 1 tablet (20 mg total) by mouth daily. Patient not taking: Reported on 12/09/2016 11/08/16   Roselee Nova, MD  benzonatate (TESSALON PERLES) 100 MG capsule Take 1 capsule (100 mg total) by mouth 3 (three) times daily as needed for cough. Patient not taking: Reported on 12/09/2016 11/08/16   Roselee Nova, MD  glucose blood (FREESTYLE TEST STRIPS) test strip Use as instructed 12/09/16   Roselee Nova, MD     Family History  Problem Relation Age of Onset  . Dementia Father   . COPD Sister   . Cancer Sister   . Cancer Mother     Social History   Social History  . Marital status: Single    Spouse name: N/A  . Number of children: N/A  . Years of education: N/A   Social History Main Topics  . Smoking status: Former Smoker    Quit date: 08/20/1995  .  Smokeless tobacco: Never Used  . Alcohol use No  . Drug use: No  . Sexual activity: Not Asked   Other Topics Concern  . 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   She is single. She has no children. She has a support system specifically with her niece.    Review of Systems: A 12 point ROS discussed and pertinent positives are indicated in the HPI above.  All other systems are negative.  Review of Systems  Constitutional: Positive for activity change,  appetite change and fatigue. Negative for diaphoresis and fever.  Respiratory: Negative for shortness of breath.   Cardiovascular: Negative for chest pain.  Gastrointestinal: Positive for abdominal pain and nausea.  Musculoskeletal: Negative for gait problem.  Neurological: Positive for weakness.  Psychiatric/Behavioral: Negative for behavioral problems and confusion.    Vital Signs: BP (!) 104/52   Pulse 90   Temp 97.6 F (36.4 C) (Oral)   Resp 18   Ht '5\' 6"'$  (1.676 m)   Wt 223 lb 1.6 oz (101.2 kg) Comment: scale c  SpO2 96%   BMI 36.01 kg/m   Physical Exam  Constitutional: She is oriented to person, place, and time.  Cardiovascular: Normal rate, regular rhythm and normal heart sounds.   Pulmonary/Chest: Effort normal and breath sounds normal.  Abdominal: Soft. Bowel sounds are normal. There is tenderness.  Musculoskeletal: Normal range of motion.  Neurological: She is alert and oriented to person, place, and time.  Skin: Skin is warm and dry.  Psychiatric: She has a normal mood and affect. Her behavior is normal. Judgment and thought content normal.  Nursing note and vitals reviewed.   Mallampati Score:  MD Evaluation Airway: WNL Heart: WNL Abdomen: WNL Chest/ Lungs: WNL ASA  Classification: 3 Mallampati/Airway Score: One  Imaging: Dg Chest 2 View  Result Date: 12/09/2016 CLINICAL DATA:  Shortness of breath, nausea and dry cough since October, 2017. EXAM: CHEST  2 VIEW COMPARISON:  Single-view of the chest 05/11/2015. FINDINGS: Small focus of airspace disease is seen in the anterior right upper lobe. The left lung is clear. Heart size is normal. No pneumothorax or pleural effusion. Aortic atherosclerosis is noted. No acute abnormality. IMPRESSION: Airspace disease in the anterior right upper lobe has an appearance most compatible with atelectasis but could be due to pneumonia. Atherosclerosis. Electronically Signed   By: Inge Rise M.D.   On: 12/09/2016 08:51    Ct Chest W Contrast  Result Date: 12/16/2016 CLINICAL DATA:  Liver mass on ultrasound and MRI. Elevated liver enzymes. EXAM: CT ABDOMEN AND PELVIS WITH CONTRAST TECHNIQUE: Multidetector CT imaging of the abdomen and pelvis was performed using the standard protocol following bolus administration of intravenous contrast. CONTRAST:  70m ISOVUE-300 IOPAMIDOL (ISOVUE-300) INJECTION 61% COMPARISON:  MRI 12/14/2016 FINDINGS: CT CHEST FINDINGS Cardiovascular: No significant vascular findings. Normal heart size. No pericardial effusion. Mediastinum/Nodes: No axillary supraclavicular adenopathy. Small 15 mm nodule in the LEFT lobe of thyroid gland. Enlarged RIGHT lower paratracheal lymph nodes which extend into the RIGHT hilum. RIGHT lower paratracheal lymph node measures 18 mm short axis. RIGHT hilar lymph node measures 16 mm. There is postobstructive atelectasis of the RIGHT middle lobe associated with this hilar adenopathy/mass. Lungs/Pleura: RIGHT middle lobe atelectasis. Linear nodular thickening adjacent to the atelectasis measuring 21 by 19 mm (image 55, series 8). Small peripheral nodule measuring 8 mm on image 57, series 5. Musculoskeletal:  No aggressive osseous lesion. CT ABDOMEN AND PELVIS FINDINGS Hepatobiliary: Multiple low-attenuation lesions with peripheral enhancement within LEFT and RIGHT hepatic lobes as described on comparison MRI. Central abdominal lesion measures 53 mm (image 44, series 3). Large RIGHT hepatic lobe lesion measures 84 mm x 55 mm. LEFT lateral hepatic lobe lesion measures 27 by 27 mm image 55, series 3. There are approximately 30 lesions within liver. No biliary duct dilatation. Postcholecystectomy. Common bile duct is dilated to 12 mm presumably related to cholecystectomy. Pancreas: Pancreas is normal. No ductal dilatation. No pancreatic inflammation. Spleen: Normal spleen Adrenals/urinary tract: RIGHT adrenal gland is enlarged to 22 by 31 mm and enhances. Kidneys ureters normal.  Bladder normal. Stomach/Bowel: Post gastric bypass anatomy. No bowel obstruction. Appendix normal. Several diverticula of the colon without acute inflammation. Vascular/Lymphatic: Abdominal aorta is normal caliber. There is no retroperitoneal or periportal lymphadenopathy. No pelvic lymphadenopathy. Reproductive: Uterus and ovaries normal Other: No free fluid. Musculoskeletal: No aggressive osseous lesion. IMPRESSION: Chest Impression: 1. RIGHT hilar adenopathy / mass with postobstructive collapse of the RIGHT middle lobe is suggestive of SMALL CELL LUNG CANCER. 2. Nodular lesion in the RIGHT middle lobe adjacent to the atelectasis may represent primary malignancy. 3. Enlarged RIGHT paratracheal metastatic adenopathy. Abdomen / Pelvis Impression: 1. Multiple hepatic metastasis as described on comparison MRI. 2. RIGHT adrenal gland metastasis. 3. No metastatic adenopathy in the abdomen pelvis. 4. Postsurgical change consistent with cholecystectomy and bariatric surgery Electronically Signed   By: Suzy Bouchard M.D.   On: 12/16/2016 10:24   Ct Abdomen Pelvis W Contrast  Result Date: 12/16/2016 CLINICAL DATA:  Liver mass on ultrasound and MRI. Elevated liver enzymes. EXAM: CT ABDOMEN AND PELVIS WITH CONTRAST TECHNIQUE: Multidetector CT imaging of the abdomen and pelvis was performed using the standard protocol following bolus administration of intravenous contrast. CONTRAST:  30m ISOVUE-300 IOPAMIDOL (ISOVUE-300) INJECTION 61% COMPARISON:  MRI 12/14/2016 FINDINGS: CT CHEST FINDINGS Cardiovascular: No significant vascular findings. Normal heart size. No pericardial effusion. Mediastinum/Nodes: No axillary supraclavicular adenopathy. Small 15 mm nodule in the LEFT lobe of thyroid gland. Enlarged RIGHT lower paratracheal lymph nodes which extend into the RIGHT hilum. RIGHT lower paratracheal lymph node measures 18 mm short axis. RIGHT hilar lymph node measures 16 mm. There is postobstructive atelectasis of the  RIGHT middle lobe associated with this hilar adenopathy/mass. Lungs/Pleura: RIGHT middle lobe atelectasis. Linear nodular thickening adjacent to the atelectasis measuring 21 by 19 mm (image 55, series 8). Small peripheral nodule measuring 8 mm on image 57, series 5. Musculoskeletal: No aggressive osseous lesion. CT ABDOMEN AND PELVIS FINDINGS Hepatobiliary: Multiple low-attenuation lesions with peripheral enhancement within LEFT and RIGHT hepatic lobes as described on comparison MRI. Central abdominal lesion measures 53 mm (image 44, series 3). Large RIGHT hepatic lobe lesion measures 84 mm x 55 mm. LEFT lateral hepatic lobe lesion measures 27 by 27 mm image 55, series 3. There are approximately 30 lesions within liver. No biliary duct dilatation. Postcholecystectomy. Common bile duct is dilated to 12 mm presumably related to cholecystectomy. Pancreas: Pancreas is normal. No ductal dilatation. No pancreatic inflammation. Spleen: Normal spleen Adrenals/urinary tract: RIGHT adrenal gland is enlarged to 22 by 31 mm and enhances. Kidneys ureters normal. Bladder normal. Stomach/Bowel: Post gastric bypass anatomy. No bowel obstruction. Appendix normal. Several diverticula of the colon without acute inflammation. Vascular/Lymphatic: Abdominal aorta is normal caliber. There is no retroperitoneal or periportal lymphadenopathy. No pelvic lymphadenopathy. Reproductive: Uterus and ovaries normal Other: No free fluid. Musculoskeletal: No aggressive osseous lesion. IMPRESSION:  Chest Impression: 1. RIGHT hilar adenopathy / mass with postobstructive collapse of the RIGHT middle lobe is suggestive of SMALL CELL LUNG CANCER. 2. Nodular lesion in the RIGHT middle lobe adjacent to the atelectasis may represent primary malignancy. 3. Enlarged RIGHT paratracheal metastatic adenopathy. Abdomen / Pelvis Impression: 1. Multiple hepatic metastasis as described on comparison MRI. 2. RIGHT adrenal gland metastasis. 3. No metastatic adenopathy  in the abdomen pelvis. 4. Postsurgical change consistent with cholecystectomy and bariatric surgery Electronically Signed   By: Suzy Bouchard M.D.   On: 12/16/2016 10:24   Mr Liver W HA Contrast  Result Date: 12/14/2016 CLINICAL DATA:  Elevated liver function tests. Liver masses seen on recent ultrasound. EXAM: MRI ABDOMEN WITHOUT AND WITH CONTRAST TECHNIQUE: Multiplanar multisequence MR imaging of the abdomen was performed both before and after the administration of intravenous contrast. CONTRAST:  25m MULTIHANCE GADOBENATE DIMEGLUMINE 529 MG/ML IV SOLN COMPARISON:  Ultrasound on 12/12/2016 FINDINGS: Lower chest: No acute findings. Hepatobiliary: Innumerable hypovascular metastases are seen throughout the right and left hepatic lobes. Majority of the larger metastases show central necrosis. Dominant metastatic lesion in the right hepatic lobe measures 9.9 x 7.1 cm on image 58/1101. Prior cholecystectomy noted. No evidence of biliary dilatation. Pancreas:  No mass or inflammatory changes. Spleen: A 2.5 x 3.1 cm homogeneous right adrenal mass is seen which has nonspecific characteristics. This mass does show restricted diffusion, which favors an adrenal metastasis over adrenal adenoma. Left adrenal gland is normal in appearance. No renal masses are identified.  No evidence of hydronephrosis. Adrenals/Urinary Tract: No masses identified. No evidence of hydronephrosis. Stomach/Bowel: Visualized portions within the abdomen are unremarkable. Vascular/Lymphatic: No pathologically enlarged lymph nodes identified. No abdominal aortic aneurysm. Other:  None. Musculoskeletal:  No suspicious bone lesions identified. IMPRESSION: Diffuse hypovascular liver metastases. 3 cm right adrenal mass, suspicious for metastasis although lipid-poor adrenal adenoma is a consideration. Electronically Signed   By: JEarle GellM.D.   On: 12/14/2016 18:36   Dg Abdomen Acute W/chest  Result Date: 12/12/2016 CLINICAL DATA:  Cough,  upper abdominal pain EXAM: DG ABDOMEN ACUTE W/ 1V CHEST COMPARISON:  12/09/2016 and 05/11/2015 CXR FINDINGS: There is no evidence of dilated bowel loops or free intraperitoneal air. Surgical clips are seen in the upper abdomen bilaterally in the right upper quadrant and epigastric region as well as left lower quadrant. No radiopaque calculi. Lumbar degenerative changes are seen. Mild joint space narrowing of both hips. No acute osseous appearing abnormality. Heart size and mediastinal contours are within normal limits. Ovoid density along the minor fissure is noted which may reflect a pseudo lesion with trace fluid or atelectasis along side or within the fissure. IMPRESSION: Negative abdominal radiographs. No acute cardiopulmonary disease. Fluid or atelectasis along side the minor fissure as on recent comparison study. Electronically Signed   By: DAshley RoyaltyM.D.   On: 12/12/2016 00:39   [place]domen Limited Ruq  Result Date: 12/12/2016 CLINICAL DATA:  Acute onset of abnormal LFTs.  Initial encounter. EXAM: [place]DOM[place] LIMITED - RIGHT UPPER QUADRANT COMPARISON:  None. FINDINGS: Gallbladder: Status post cholecystectomy.  No retained stones seen. Common bile duct: Diameter: 1.2 cm, likely within normal limits status post cholecystectomy. Liver: Multiple masses are noted within the liver, diffusely heterogeneous in appearance. There is a complex 5.8 x 5.6 x 5.5 cm mass at the right hepatic lobe, a 6.0 x 4.7 x 3.5 cm mass also at the right hepatic lobe, and a 4.7 x 4.5 x 3.2 cm mass at  the left hepatic lobe. There is diffuse heterogeneity of hepatic echogenicity. IMPRESSION: Multiple heterogeneous hepatic masses noted, measuring up to 6 cm in size. These are difficult to fully characterize on ultrasound. Dynamic liver protocol MRI or CT is recommended for further evaluation. Electronically Signed   By: Garald Balding M.D.   On: 12/12/2016 04:55    Labs:  CBC:  Recent Labs  12/11/16 1750 12/13/16 0700  12/14/16 0321 12/14/16 1627  WBC 11.5* 12.4* 10.9* 15.0*  HGB 11.8* 11.9* 10.4* 11.4*  HCT 36.9 36.4 32.4* 36.2  PLT 221 286 226 305    COAGS:  Recent Labs  12/14/16 1405  INR 1.04    BMP:  Recent Labs  12/13/16 0700 12/14/16 0321 12/14/16 1405 12/15/16 0246 12/16/16 0343  NA 135 137  --  138 139  K 2.9* 2.6* 3.6 3.2* 3.3*  CL 89* 89*  --  94* 93*  CO2 33* 38*  --  35* 34*  GLUCOSE 379* 299*  --  159* 174*  BUN 16 25*  --  30* 32*  CALCIUM 9.0 8.8*  --  8.8* 9.0  CREATININE 0.86 0.75  --  0.81 0.69  GFRNONAA >60 >60  --  >60 >60  GFRAA >60 >60  --  >60 >60    LIVER FUNCTION TESTS:  Recent Labs  12/12/16 0021 12/13/16 0700 12/14/16 0321 12/15/16 0246  BILITOT 1.4* 1.0 0.6 0.8  AST 92* 132* 112* 142*  ALT 203* 238* 228* 237*  ALKPHOS 94 100 89 89  PROT 6.4* 6.5 5.4* 5.3*  ALBUMIN 3.1* 3.3* 2.8* 2.9*    TUMOR MARKERS:  Recent Labs  12/14/16 1405  AFPTM 2.1    Assessment and Plan:  Weakness; dyspnea Elevated LFTs Abnormal CT: Hilar LAN; RML mass; enlarged R paratracheal metastatic adenopathy Multiple hepatic lesions; R adrenal gland mets Now scheduled for liver lesion biopsy in IR 4/16 Risks and Benefits discussed with the patient including, but not limited to bleeding, infection, damage to adjacent structures or low yield requiring additional tests. All of the patient's questions were answered, patient is agreeable to proceed. Consent signed and in chart.   Thank you for this interesting consult.  I greatly enjoyed meeting EMMOGENE SIMSON and look forward to participating in their care.  A copy of this report was sent to the requesting provider on this date.  Electronically Signed: Monia Sabal A 12/16/2016, 2:29 PM   I spent a total of 40 Minutes    in face to face in clinical consultation, greater than 50% of which was counseling/coordinating care for liver lesion biopsy

## 2016-12-16 NOTE — Progress Notes (Signed)
Pt is alert and oriented very weak vitals stable low, denies pain, but has been up and down all night with no rest. Plan to get more nurtishment in.

## 2016-12-17 LAB — GLUCOSE, CAPILLARY
GLUCOSE-CAPILLARY: 151 mg/dL — AB (ref 65–99)
GLUCOSE-CAPILLARY: 165 mg/dL — AB (ref 65–99)
Glucose-Capillary: 144 mg/dL — ABNORMAL HIGH (ref 65–99)
Glucose-Capillary: 189 mg/dL — ABNORMAL HIGH (ref 65–99)

## 2016-12-17 LAB — BASIC METABOLIC PANEL
ANION GAP: 10 (ref 5–15)
BUN: 44 mg/dL — ABNORMAL HIGH (ref 6–20)
CALCIUM: 9.2 mg/dL (ref 8.9–10.3)
CO2: 36 mmol/L — ABNORMAL HIGH (ref 22–32)
Chloride: 93 mmol/L — ABNORMAL LOW (ref 101–111)
Creatinine, Ser: 0.96 mg/dL (ref 0.44–1.00)
GFR, EST NON AFRICAN AMERICAN: 59 mL/min — AB (ref >60)
GLUCOSE: 155 mg/dL — AB (ref 65–99)
Potassium: 3.5 mmol/L (ref 3.5–5.1)
Sodium: 139 mmol/L (ref 135–145)

## 2016-12-17 MED ORDER — FUROSEMIDE 20 MG PO TABS
20.0000 mg | ORAL_TABLET | Freq: Every day | ORAL | Status: DC
  Administered 2016-12-18: 20 mg via ORAL
  Filled 2016-12-17 (×2): qty 1

## 2016-12-17 MED ORDER — POTASSIUM CHLORIDE 20 MEQ PO PACK
20.0000 meq | PACK | Freq: Every day | ORAL | Status: DC
  Administered 2016-12-17 – 2016-12-18 (×2): 20 meq via ORAL
  Filled 2016-12-17 (×3): qty 1

## 2016-12-17 NOTE — Progress Notes (Signed)
Pt's stable, vitals stable, no any complications of SOB and Pain, will continue to monitor

## 2016-12-17 NOTE — Progress Notes (Signed)
PROGRESS NOTE    Mackenzie Park  BWI:[id number] DOB: Apr 29, 1946 DOA: 12/11/2016 PCP: Keith Rake, MD    Brief Narrative:  71 yo female with diastolic heart failure, presents with acute decompensation. 2 weeks progressive, weakness, dyspnea and lower extremities edema. Symptoms improving with diuresis, elevated LFTs [place] with liver masses. Gastroenterology consulted, requested CT chest/ abdomen, noted hilar lymphadenopathy and large hepatic masses. IR consulted for biopsy, scheduled for 04/16.    Assessment & Plan:   Principal Problem:   Acute on chronic diastolic CHF (congestive heart failure) (HCC) Active Problems:   Hypertension   Hyperlipidemia   Diabetes mellitus, type 2 (HCC)   Abnormal LFTs   CHF exacerbation ([place])  1. Decompensated diastolic heart failure acute on chronic decompensation. Will continue diuresis with po furosemide 20 mg, continue b blocker with metoprolol and ace inh with lisinopril. Urine output over last 24 hours 1,200 ml with total negative fluid balance of 6,846.4 ml. Close to eu-volemia.   2. Liver masses suspected small cell lung cancer. CT with large right hilar lymphadenopathy with large liver masses and right adrenal gland metastatsis, suggestive for samll cell lung cancer. Planned for biopsy on 12/19/16.  3. HTN.Blood pressure control with lisinopril and metoprolol, blood pressure systolic 92 to 96. Will decrease furosemide to 20 mg po in preparation for discharge, continue fluid restriction 1200 ml per 24 hours.   4. T2DM. Continue glucose cover and monitoring, capillary glucose 124, 144, 189. Continue  20 units glargine bid and 4 units aspart premeal, plus insulin sliding scale.    DVT prophylaxis:enoxaparin  Code Status:full Family Communication:No family at the bedside  Disposition Plan:home   Consultants:  Gastroenterology   Procedures:   Antimicrobials:     Subjective: Patient with improved dyspnea, persistent  orthopnea, no further nausea or vomiting, no abdominal pain.   Objective: Vitals:   12/16/16 1221 12/16/16 1946 12/16/16 1956 12/17/16 0530  BP: (!) 104/52 93/80 110/84 96/74  Pulse: 90 87  90  Resp:  16  18  Temp:  98.1 F (36.7 C)  98.4 F (36.9 C)  TempSrc:  Oral  Oral  SpO2: 96% 97%  98%  Weight:    100.8 kg (222 lb 4.8 oz)  Height:        Intake/Output Summary (Last 24 hours) at 12/17/16 0856 Last data filed at 12/17/16 0554  Gross per 24 hour  Intake              740 ml  Output             1200 ml  Net             -460 ml   Filed Weights   12/15/16 0449 12/16/16 0358 12/17/16 0530  Weight: 101.7 kg (224 lb 4.8 oz) 101.2 kg (223 lb 1.6 oz) 100.8 kg (222 lb 4.8 oz)    Examination:  General exam: deconditioned E ENT: no pallor or icterus.  Respiratory system: Mild decreased breath sounds at bases, no wheezing, rales or rhonchi. Respiratory effort normal. Cardiovascular system: S1 & S2 heard, RRR. No JVD, murmurs, rubs, gallops or clicks. No pedal edema. Gastrointestinal system: Abdomen is nondistended, soft and nontender. No organomegaly or masses felt. Normal bowel sounds heard. Central nervous system: Alert and oriented. No focal neurological deficits. Extremities: Symmetric 5 x 5 power. Skin: No rashes, lesions or ulcers     Data Reviewed: I have personally reviewed following labs and imaging studies  CBC:  Recent Labs Lab 12/11/16 1750  12/13/16 0700 12/14/16 0321 12/14/16 1627  WBC 11.5* 12.4* 10.9* 15.0*  NEUTROABS  --   --   --  13.1*  HGB 11.8* 11.9* 10.4* 11.4*  HCT 36.9 36.4 32.4* 36.2  MCV 84.8 84.3 84.6 85.8  PLT 221 286 226 017   Basic Metabolic Panel:  Recent Labs Lab 12/12/16 0410 12/13/16 0700 12/14/16 0321 12/14/16 1405 12/15/16 0246 12/16/16 0343 12/17/16 0301  NA  --  135 137  --  138 139 139  K  --  2.9* 2.6* 3.6 3.2* 3.3* 3.5  CL  --  89* 89*  --  94* 93* 93*  CO2  --  33* 38*  --  35* 34* 36*  GLUCOSE  --  379* 299*   --  159* 174* 155*  BUN  --  16 25*  --  30* 32* 44*  CREATININE  --  0.86 0.75  --  0.81 0.69 0.96  CALCIUM  --  9.0 8.8*  --  8.8* 9.0 9.2  MG 1.9  --   --   --  2.1  --   --    GFR: Estimated Creatinine Clearance: 66.3 mL/min (by C-G formula based on SCr of 0.96 mg/dL). Liver Function Tests:  Recent Labs Lab 12/12/16 0021 12/13/16 0700 12/14/16 0321 12/15/16 0246  AST 92* 132* 112* 142*  ALT 203* 238* 228* 237*  ALKPHOS 94 100 89 89  BILITOT 1.4* 1.0 0.6 0.8  PROT 6.4* 6.5 5.4* 5.3*  ALBUMIN 3.1* 3.3* 2.8* 2.9*    Recent Labs Lab 12/12/16 0021  LIPASE 56*   No results for input(s): AMMONIA in the last 168 hours. Coagulation Profile:  Recent Labs Lab 12/14/16 1405  INR 1.04   Cardiac Enzymes:  Recent Labs Lab 12/12/16 0405 12/12/16 1022 12/12/16 1556  TROPONINI 0.03* <0.03 <0.03   BNP (last 3 results) No results for input(s): PROBNP in the last 8760 hours. HbA1C: No results for input(s): HGBA1C in the last 72 hours. CBG:  Recent Labs Lab 12/16/16 0742 12/16/16 1121 12/16/16 1603 12/16/16 2107 12/17/16 0725  GLUCAP 218* 258* 182* 142* 144*   Lipid Profile: No results for input(s): CHOL, HDL, LDLCALC, TRIG, CHOLHDL, LDLDIRECT in the last 72 hours. Thyroid Function Tests: No results for input(s): TSH, T4TOTAL, FREET4, T3FREE, THYROIDAB in the last 72 hours. Anemia Panel: No results for input(s): VITAMINB12, FOLATE, FERRITIN, TIBC, IRON, RETICCTPCT in the last 72 hours. Sepsis Labs: No results for input(s): PROCALCITON, LATICACIDVEN in the last 168 hours.  Recent Results (from the past 240 hour(s))  Urine culture     Status: Abnormal   Collection Time: 12/11/16  6:02 PM  Result Value Ref Range Status   Specimen Description URINE, RANDOM  Final   Special Requests NONE  Final   Culture (A)  Final    >=100,000 COLONIES/mL GROUP B STREP(S.AGALACTIAE)ISOLATED TESTING AGAINST S. AGALACTIAE NOT ROUTINELY PERFORMED DUE TO PREDICTABILITY OF  AMP/PEN/VAN SUSCEPTIBILITY. 20,000 COLONIES/mL ESCHERICHIA COLI    Report Status 12/14/2016 FINAL  Final   Organism ID, Bacteria ESCHERICHIA COLI (A)  Final      Susceptibility   Escherichia coli - MIC*    AMPICILLIN <=2 SENSITIVE Sensitive     CEFAZOLIN <=4 SENSITIVE Sensitive     CEFTRIAXONE <=1 SENSITIVE Sensitive     CIPROFLOXACIN <=0.25 SENSITIVE Sensitive     GENTAMICIN <=1 SENSITIVE Sensitive     IMIPENEM <=0.25 SENSITIVE Sensitive     NITROFURANTOIN <=16 SENSITIVE Sensitive     TRIMETH/SULFA <=20 SENSITIVE  Sensitive     AMPICILLIN/SULBACTAM <=2 SENSITIVE Sensitive     PIP/TAZO <=4 SENSITIVE Sensitive     Extended ESBL NEGATIVE Sensitive     * 20,000 COLONIES/mL ESCHERICHIA COLI         Radiology Studies: Ct Chest W Contrast  Result Date: 12/16/2016 CLINICAL DATA:  Liver mass on ultrasound and MRI. Elevated liver enzymes. EXAM: CT ABDOMEN AND PELVIS WITH CONTRAST TECHNIQUE: Multidetector CT imaging of the abdomen and pelvis was performed using the standard protocol following bolus administration of intravenous contrast. CONTRAST:  57m ISOVUE-300 IOPAMIDOL (ISOVUE-300) INJECTION 61% COMPARISON:  MRI 12/14/2016 FINDINGS: CT CHEST FINDINGS Cardiovascular: No significant vascular findings. Normal heart size. No pericardial effusion. Mediastinum/Nodes: No axillary supraclavicular adenopathy. Small 15 mm nodule in the LEFT lobe of thyroid gland. Enlarged RIGHT lower paratracheal lymph nodes which extend into the RIGHT hilum. RIGHT lower paratracheal lymph node measures 18 mm short axis. RIGHT hilar lymph node measures 16 mm. There is postobstructive atelectasis of the RIGHT middle lobe associated with this hilar adenopathy/mass. Lungs/Pleura: RIGHT middle lobe atelectasis. Linear nodular thickening adjacent to the atelectasis measuring 21 by 19 mm (image 55, series 8). Small peripheral nodule measuring 8 mm on image 57, series 5. Musculoskeletal: No aggressive osseous lesion. CT ABDOMEN  AND PELVIS FINDINGS Hepatobiliary: Multiple low-attenuation lesions with peripheral enhancement within LEFT and RIGHT hepatic lobes as described on comparison MRI. Central abdominal lesion measures 53 mm (image 44, series 3). Large RIGHT hepatic lobe lesion measures 84 mm x 55 mm. LEFT lateral hepatic lobe lesion measures 27 by 27 mm image 55, series 3. There are approximately 30 lesions within liver. No biliary duct dilatation. Postcholecystectomy. Common bile duct is dilated to 12 mm presumably related to cholecystectomy. Pancreas: Pancreas is normal. No ductal dilatation. No pancreatic inflammation. Spleen: Normal spleen Adrenals/urinary tract: RIGHT adrenal gland is enlarged to 22 by 31 mm and enhances. Kidneys ureters normal. Bladder normal. Stomach/Bowel: Post gastric bypass anatomy. No bowel obstruction. Appendix normal. Several diverticula of the colon without acute inflammation. Vascular/Lymphatic: Abdominal aorta is normal caliber. There is no retroperitoneal or periportal lymphadenopathy. No pelvic lymphadenopathy. Reproductive: Uterus and ovaries normal Other: No free fluid. Musculoskeletal: No aggressive osseous lesion. IMPRESSION: Chest Impression: 1. RIGHT hilar adenopathy / mass with postobstructive collapse of the RIGHT middle lobe is suggestive of SMALL CELL LUNG CANCER. 2. Nodular lesion in the RIGHT middle lobe adjacent to the atelectasis may represent primary malignancy. 3. Enlarged RIGHT paratracheal metastatic adenopathy. Abdomen / Pelvis Impression: 1. Multiple hepatic metastasis as described on comparison MRI. 2. RIGHT adrenal gland metastasis. 3. No metastatic adenopathy in the abdomen pelvis. 4. Postsurgical change consistent with cholecystectomy and bariatric surgery Electronically Signed   By: SSuzy BouchardM.D.   On: 12/16/2016 10:24   Ct Abdomen Pelvis W Contrast  Result Date: 12/16/2016 CLINICAL DATA:  Liver mass on ultrasound and MRI. Elevated liver enzymes. EXAM: CT ABDOMEN  AND PELVIS WITH CONTRAST TECHNIQUE: Multidetector CT imaging of the abdomen and pelvis was performed using the standard protocol following bolus administration of intravenous contrast. CONTRAST:  768mISOVUE-300 IOPAMIDOL (ISOVUE-300) INJECTION 61% COMPARISON:  MRI 12/14/2016 FINDINGS: CT CHEST FINDINGS Cardiovascular: No significant vascular findings. Normal heart size. No pericardial effusion. Mediastinum/Nodes: No axillary supraclavicular adenopathy. Small 15 mm nodule in the LEFT lobe of thyroid gland. Enlarged RIGHT lower paratracheal lymph nodes which extend into the RIGHT hilum. RIGHT lower paratracheal lymph node measures 18 mm short axis. RIGHT hilar lymph node measures 16 mm. There is postobstructive  atelectasis of the RIGHT middle lobe associated with this hilar adenopathy/mass. Lungs/Pleura: RIGHT middle lobe atelectasis. Linear nodular thickening adjacent to the atelectasis measuring 21 by 19 mm (image 55, series 8). Small peripheral nodule measuring 8 mm on image 57, series 5. Musculoskeletal: No aggressive osseous lesion. CT ABDOMEN AND PELVIS FINDINGS Hepatobiliary: Multiple low-attenuation lesions with peripheral enhancement within LEFT and RIGHT hepatic lobes as described on comparison MRI. Central abdominal lesion measures 53 mm (image 44, series 3). Large RIGHT hepatic lobe lesion measures 84 mm x 55 mm. LEFT lateral hepatic lobe lesion measures 27 by 27 mm image 55, series 3. There are approximately 30 lesions within liver. No biliary duct dilatation. Postcholecystectomy. Common bile duct is dilated to 12 mm presumably related to cholecystectomy. Pancreas: Pancreas is normal. No ductal dilatation. No pancreatic inflammation. Spleen: Normal spleen Adrenals/urinary tract: RIGHT adrenal gland is enlarged to 22 by 31 mm and enhances. Kidneys ureters normal. Bladder normal. Stomach/Bowel: Post gastric bypass anatomy. No bowel obstruction. Appendix normal. Several diverticula of the colon without  acute inflammation. Vascular/Lymphatic: Abdominal aorta is normal caliber. There is no retroperitoneal or periportal lymphadenopathy. No pelvic lymphadenopathy. Reproductive: Uterus and ovaries normal Other: No free fluid. Musculoskeletal: No aggressive osseous lesion. IMPRESSION: Chest Impression: 1. RIGHT hilar adenopathy / mass with postobstructive collapse of the RIGHT middle lobe is suggestive of SMALL CELL LUNG CANCER. 2. Nodular lesion in the RIGHT middle lobe adjacent to the atelectasis may represent primary malignancy. 3. Enlarged RIGHT paratracheal metastatic adenopathy. Abdomen / Pelvis Impression: 1. Multiple hepatic metastasis as described on comparison MRI. 2. RIGHT adrenal gland metastasis. 3. No metastatic adenopathy in the abdomen pelvis. 4. Postsurgical change consistent with cholecystectomy and bariatric surgery Electronically Signed   By: Suzy Bouchard M.D.   On: 12/16/2016 10:24        Scheduled Meds: . aspirin EC  81 mg Oral Daily  . enoxaparin (LOVENOX) injection  40 mg Subcutaneous Q24H  . [START ON 12/19/2016] enoxaparin (LOVENOX) injection  40 mg Subcutaneous Q24H  . feeding supplement (GLUCERNA SHAKE)  237 mL Oral Q24H  . furosemide  20 mg Intravenous Daily  . gabapentin  300 mg Oral QHS  . insulin aspart  0-15 Units Subcutaneous TID WC  . insulin aspart  0-5 Units Subcutaneous QHS  . insulin aspart  4 Units Subcutaneous TID WC  . insulin glargine  20 Units Subcutaneous BID  . lisinopril  20 mg Oral Daily  . metoprolol tartrate  12.5 mg Oral BID  . potassium chloride  40 mEq Oral Daily  . protein supplement shake  11 oz Oral Q24H  . sodium chloride flush  3 mL Intravenous Q12H   Continuous Infusions:   LOS: 5 days      Lisandro Meggett Gerome Apley, MD [facility] Pager [phone]  If 7PM-7AM, please contact night-coverage [email or web address] Password [email or web address] 12/17/2016, 8:56 AM

## 2016-12-18 DIAGNOSIS — Z794 Long term (current) use of insulin: Secondary | ICD-10-CM

## 2016-12-18 LAB — GLUCOSE, CAPILLARY
GLUCOSE-CAPILLARY: 198 mg/dL — AB (ref 65–99)
GLUCOSE-CAPILLARY: 188 mg/dL — AB (ref 65–99)
Glucose-Capillary: 203 mg/dL — ABNORMAL HIGH (ref 65–99)
Glucose-Capillary: 162 mg/dL — ABNORMAL HIGH (ref 65–99)

## 2016-12-18 LAB — BASIC METABOLIC PANEL
Anion gap: 11 (ref 5–15)
BUN: 46 mg/dL — AB (ref 6–20)
CALCIUM: 9.1 mg/dL (ref 8.9–10.3)
CHLORIDE: 95 mmol/L — AB (ref 101–111)
CO2: 32 mmol/L (ref 22–32)
CREATININE: 0.88 mg/dL (ref 0.44–1.00)
Glucose, Bld: 181 mg/dL — ABNORMAL HIGH (ref 65–99)
Potassium: 4.4 mmol/L (ref 3.5–5.1)
SODIUM: 138 mmol/L (ref 135–145)

## 2016-12-18 NOTE — Progress Notes (Signed)
Pt felt nauseated this am, IV Zofran given, refuse to take protein drink this evening fear of nausea and sickness, otherwise vitals stable, no any complain of pain, going to be in NPO for liver biopsy tomorrow am, no order to release so far, consent has not taken so far since there is no order yet, will continue to monitor the patient

## 2016-12-18 NOTE — Progress Notes (Signed)
PROGRESS NOTE    DANIQUA Park  GOT:[id number] DOB: 1945-12-22 DOA: 12/11/2016 PCP: Keith Rake, MD    Brief Narrative:  71 yo female with diastolic heart failure, presents with acute decompensation. 2 weeks progressive, weakness, dyspnea and lower extremities edema. Symptoms improving with diuresis, elevated LFTs [place] with liver masses. Gastroenterology consulted, requested CT chest/ abdomen, noted hilar lymphadenopathy and large hepatic masses. IR consulted for biopsy, scheduled for 04/16.   Assessment & Plan:   Principal Problem:   Acute on chronic diastolic CHF (congestive heart failure) (HCC) Active Problems:   Hypertension   Hyperlipidemia   Diabetes mellitus, type 2 (HCC)   Abnormal LFTs   CHF exacerbation ([place])   1. Decompensated diastolic heart failure acute on chronic decompensation. Tolerating well diuresis with po furosemide 20 mg, b blocker with metoprolol and ace inh with lisinopril. Continue negative fluid balance with urine output over last 24 hours, 1300 and negative balance since admission -7,406 ml.   2. Liver masses suspected small cell lung cancer. CT with large right hilar lymphadenopathy with large liver masses and right adrenal gland metastatsis, suggestive for samll cell lung cancer. Planned for biopsy on 12/19/16, per interventional radiology.   3. HTN.Continue blood pressure control with lisinopril and metoprolol, blood pressure systolic 597 to 98.   4. C1UL. Continue glucose cover and monitoring, capillary glucose 165, 198, 203.  Will continue  20 units glargine bid and 4 units aspart premeal, plus insulin sliding scale.  Plan to discharge in am after liver biopsy.   DVT prophylaxis:enoxaparin  Code Status:full Family Communication:No family at the bedside  Disposition Plan:home   Consultants:  Gastroenterology   Interventional Radiology   Procedures:   Antimicrobials:   Subjective: Patient developed severe nausea and vomiting, no  chest pain or dyspnea, no diarrhea.   Objective: Vitals:   12/17/16 0530 12/17/16 1235 12/17/16 2042 12/18/16 0554  BP: 96/74 92/68 (!) 133/58 (!) 129/91  Pulse: 90 73 (!) 101 93  Resp: '18 18 18 18  '$ Temp: 98.4 F (36.9 C) 97.9 F (36.6 C) 98 F (36.7 C) 97.8 F (36.6 C)  TempSrc: Oral Oral Oral Oral  SpO2: 98% 94% 98% 98%  Weight: 100.8 kg (222 lb 4.8 oz)   101 kg (222 lb 9.6 oz)  Height:        Intake/Output Summary (Last 24 hours) at 12/18/16 0930 Last data filed at 12/18/16 0500  Gross per 24 hour  Intake              540 ml  Output             1000 ml  Net             -460 ml   Filed Weights   12/16/16 0358 12/17/16 0530 12/18/16 0554  Weight: 101.2 kg (223 lb 1.6 oz) 100.8 kg (222 lb 4.8 oz) 101 kg (222 lb 9.6 oz)    Examination:  General exam: deconditioned E ENT: no pallor or icterus, oral mucosa moist.  Respiratory system: Clear to auscultation. Respiratory effort normal. No wheezing, rales or rhonchi.  Cardiovascular system: S1 & S2 heard, RRR. No JVD, murmurs, rubs, gallops or clicks. No pedal edema. Gastrointestinal system: Abdomen is nondistended, soft and nontender. No organomegaly or masses felt. Normal bowel sounds heard. Central nervous system: Alert and oriented. No focal neurological deficits. Extremities: Symmetric 5 x 5 power. Skin: No rashes, lesions or ulcers     Data Reviewed: I have personally reviewed following labs and  imaging studies  CBC:  Recent Labs Lab 12/11/16 1750 12/13/16 0700 12/14/16 0321 12/14/16 1627  WBC 11.5* 12.4* 10.9* 15.0*  NEUTROABS  --   --   --  13.1*  HGB 11.8* 11.9* 10.4* 11.4*  HCT 36.9 36.4 32.4* 36.2  MCV 84.8 84.3 84.6 85.8  PLT 221 286 226 035   Basic Metabolic Panel:  Recent Labs Lab 12/12/16 0410  12/14/16 0321 12/14/16 1405 12/15/16 0246 12/16/16 0343 12/17/16 0301 12/18/16 0508  NA  --   < > 137  --  138 139 139 138  K  --   < > 2.6* 3.6 3.2* 3.3* 3.5 4.4  CL  --   < > 89*  --  94* 93*  93* 95*  CO2  --   < > 38*  --  35* 34* 36* 32  GLUCOSE  --   < > 299*  --  159* 174* 155* 181*  BUN  --   < > 25*  --  30* 32* 44* 46*  CREATININE  --   < > 0.75  --  0.81 0.69 0.96 0.88  CALCIUM  --   < > 8.8*  --  8.8* 9.0 9.2 9.1  MG 1.9  --   --   --  2.1  --   --   --   < > = values in this interval not displayed. GFR: Estimated Creatinine Clearance: 72.4 mL/min (by C-G formula based on SCr of 0.88 mg/dL). Liver Function Tests:  Recent Labs Lab 12/12/16 0021 12/13/16 0700 12/14/16 0321 12/15/16 0246  AST 92* 132* 112* 142*  ALT 203* 238* 228* 237*  ALKPHOS 94 100 89 89  BILITOT 1.4* 1.0 0.6 0.8  PROT 6.4* 6.5 5.4* 5.3*  ALBUMIN 3.1* 3.3* 2.8* 2.9*    Recent Labs Lab 12/12/16 0021  LIPASE 56*   No results for input(s): AMMONIA in the last 168 hours. Coagulation Profile:  Recent Labs Lab 12/14/16 1405  INR 1.04   Cardiac Enzymes:  Recent Labs Lab 12/12/16 0405 12/12/16 1022 12/12/16 1556  TROPONINI 0.03* <0.03 <0.03   BNP (last 3 results) No results for input(s): PROBNP in the last 8760 hours. HbA1C: No results for input(s): HGBA1C in the last 72 hours. CBG:  Recent Labs Lab 12/17/16 0725 12/17/16 1128 12/17/16 1550 12/17/16 2208 12/18/16 0719  GLUCAP 144* 189* 151* 165* 198*   Lipid Profile: No results for input(s): CHOL, HDL, LDLCALC, TRIG, CHOLHDL, LDLDIRECT in the last 72 hours. Thyroid Function Tests: No results for input(s): TSH, T4TOTAL, FREET4, T3FREE, THYROIDAB in the last 72 hours. Anemia Panel: No results for input(s): VITAMINB12, FOLATE, FERRITIN, TIBC, IRON, RETICCTPCT in the last 72 hours. Sepsis Labs: No results for input(s): PROCALCITON, LATICACIDVEN in the last 168 hours.  Recent Results (from the past 240 hour(s))  Urine culture     Status: Abnormal   Collection Time: 12/11/16  6:02 PM  Result Value Ref Range Status   Specimen Description URINE, RANDOM  Final   Special Requests NONE  Final   Culture (A)  Final     >=100,000 COLONIES/mL GROUP B STREP(S.AGALACTIAE)ISOLATED TESTING AGAINST S. AGALACTIAE NOT ROUTINELY PERFORMED DUE TO PREDICTABILITY OF AMP/PEN/VAN SUSCEPTIBILITY. 20,000 COLONIES/mL ESCHERICHIA COLI    Report Status 12/14/2016 FINAL  Final   Organism ID, Bacteria ESCHERICHIA COLI (A)  Final      Susceptibility   Escherichia coli - MIC*    AMPICILLIN <=2 SENSITIVE Sensitive     CEFAZOLIN <=4 SENSITIVE Sensitive  CEFTRIAXONE <=1 SENSITIVE Sensitive     CIPROFLOXACIN <=0.25 SENSITIVE Sensitive     GENTAMICIN <=1 SENSITIVE Sensitive     IMIPENEM <=0.25 SENSITIVE Sensitive     NITROFURANTOIN <=16 SENSITIVE Sensitive     TRIMETH/SULFA <=20 SENSITIVE Sensitive     AMPICILLIN/SULBACTAM <=2 SENSITIVE Sensitive     PIP/TAZO <=4 SENSITIVE Sensitive     Extended ESBL NEGATIVE Sensitive     * 20,000 COLONIES/mL ESCHERICHIA COLI         Radiology Studies: No results found.      Scheduled Meds: . aspirin EC  81 mg Oral Daily  . [START ON 12/19/2016] enoxaparin (LOVENOX) injection  40 mg Subcutaneous Q24H  . feeding supplement (GLUCERNA SHAKE)  237 mL Oral Q24H  . furosemide  20 mg Oral Daily  . gabapentin  300 mg Oral QHS  . insulin aspart  0-15 Units Subcutaneous TID WC  . insulin aspart  0-5 Units Subcutaneous QHS  . insulin aspart  4 Units Subcutaneous TID WC  . insulin glargine  20 Units Subcutaneous BID  . lisinopril  20 mg Oral Daily  . metoprolol tartrate  12.5 mg Oral BID  . potassium chloride  20 mEq Oral Daily  . protein supplement shake  11 oz Oral Q24H  . sodium chloride flush  3 mL Intravenous Q12H   Continuous Infusions:   LOS: 6 days     Keitha Kolk Gerome Apley, MD [facility] Pager [phone]  If 7PM-7AM, please contact night-coverage [email or web address] Password [email or web address] 12/18/2016, 9:30 AM

## 2016-12-19 LAB — PROTIME-INR
INR: 1.04
Prothrombin Time: 13.6 seconds (ref 11.4–15.2)

## 2016-12-19 LAB — CBC
HCT: 26.3 % — ABNORMAL LOW (ref 36.0–46.0)
HCT: 26.6 % — ABNORMAL LOW (ref 36.0–46.0)
HEMOGLOBIN: 8.2 g/dL — AB (ref 12.0–15.0)
Hemoglobin: 8.4 g/dL — ABNORMAL LOW (ref 12.0–15.0)
MCH: 26.9 pg (ref 26.0–34.0)
MCH: 27.3 pg (ref 26.0–34.0)
MCHC: 31.2 g/dL (ref 30.0–36.0)
MCHC: 31.6 g/dL (ref 30.0–36.0)
MCV: 86.2 fL (ref 78.0–100.0)
MCV: 86.4 fL (ref 78.0–100.0)
PLATELETS: 383 10*3/uL (ref 150–400)
Platelets: 333 10*3/uL (ref 150–400)
RBC: 3.05 MIL/uL — ABNORMAL LOW (ref 3.87–5.11)
RBC: 3.08 MIL/uL — AB (ref 3.87–5.11)
RDW: 15.1 % (ref 11.5–15.5)
RDW: 15.2 % (ref 11.5–15.5)
WBC: 16.3 10*3/uL — ABNORMAL HIGH (ref 4.0–10.5)
WBC: 15.6 10*3/uL — AB (ref 4.0–10.5)

## 2016-12-19 LAB — GLUCOSE, CAPILLARY
GLUCOSE-CAPILLARY: 190 mg/dL — AB (ref 65–99)
GLUCOSE-CAPILLARY: 204 mg/dL — AB (ref 65–99)

## 2016-12-19 MED ORDER — METOPROLOL TARTRATE 25 MG PO TABS: 12.5000 mg | ORAL_TABLET | Freq: Two times a day (BID) | ORAL | 0 refills | Status: AC

## 2016-12-19 MED ORDER — FUROSEMIDE 20 MG PO TABS: 20.0000 mg | ORAL_TABLET | Freq: Every day | ORAL | 0 refills | Status: AC

## 2016-12-19 MED ORDER — LISINOPRIL 10 MG PO TABS: 10.0000 mg | ORAL_TABLET | Freq: Every day | ORAL | Status: DC

## 2016-12-19 MED ORDER — POTASSIUM CHLORIDE ER 10 MEQ PO TBCR: 10.0000 meq | EXTENDED_RELEASE_TABLET | Freq: Every day | ORAL | 0 refills | Status: DC

## 2016-12-19 NOTE — Progress Notes (Signed)
MD at bedside talking to pt. MD aware of pt lab values and vital signs. MD stated to hold morning medications. IR aware as well.  Mackenzie Park

## 2016-12-19 NOTE — Progress Notes (Signed)
Pt discharge education provided at bedside. Pt IV discontinued, catheter intact and telemetry removed. Pt has all belongings and discharge paperwork. Pt discharged via wheelchair with RN.   Mackenzie Park

## 2016-12-19 NOTE — Progress Notes (Signed)
MD returned page stated to hold lantus and potassium. MD aware of most recent CBG and potassium lab value  Mackenzie Park Leory Plowman

## 2016-12-19 NOTE — Progress Notes (Signed)
Paged MD regarding pt NPO and pt has 20 units of lantus ordered at 1000, as well as, liquid potassium. Awaiting order clarifications.  Erico Stan

## 2016-12-19 NOTE — Progress Notes (Signed)
IR stated to schedule out patient procedures and testing must call [phone]. Sent text page to MD to inform.  Mackenzie Park

## 2016-12-19 NOTE — Progress Notes (Signed)
MD called and stated pt will complete procedure outpatient after reviewing lab values. MD stated pt will be discharged today.   Mackenzie Park

## 2016-12-19 NOTE — Discharge Summary (Signed)
Physician Discharge Summary  [place] UXN:[id number] DOB: Dec 10, 1945 DOA: 12/11/2016  PCP: Keith Rake, MD  Admit date: 12/11/2016 Discharge date: 12/19/2016  Admitted From: Home  Disposition: Home   Recommendations for Outpatient Follow-up:  1. Follow up with PCP in 1- week 2. Patient placed on furosemide and metoprolol 3. Will need outpatient follow up for IR liver biopsy prior hb and hct check.  Home Health: No  Equipment/Devices: No   Discharge Condition: Stable  CODE STATUS: Full  Diet recommendation: Heart Healthy / Carb Modified  Brief/Interim Summary: This is a 71 yo female who presented to the hospital with the chief complain of weakness associated with dyspnea and bilateral lower extremity edema. On the initial physical examination, her blood pressure was 134/60 with heart rate of 63 and respiratory rate of 18, oxygent saturation 98% on supplemental oxygen. Noted positive JVD. Her lungs had no rales, rhonchi or wheezing, heart S1-S2 present rhythmic, her abdomen was soft and nontender, lower extremities with 3+ pitting edema bilaterally. Sodium 133, potassium 3.7, chloride 93, bicarbonate 31, BUN 16 creatinine 0.63, glucose 313, AST 114, ALT 220. White count 10.9, hemoglobin 12.5, hematocrit 37.5, platelets 168. Urinalysis negative for infection, greater than 500 glucose. Chest x-ray with increased lung markings, atelectasis in the right mid lung. EKG was normal sinus rhythm.  The patient was admitted to the hospital working diagnosis of acute and chronic decompensated diastolic heart failure, complicated by elevated LFTs.  1. Decompensated diastolic heart failure, acute on chronic. Patient was admitted to the medical floor, placed on a remote telemetry monitor, aggressive diuresis with furosemide, negative fluid balance was achieved with significant improvement of her symptoms. Since admission negative 8,871 ml. patient was continued on ACE inhibitor with lisinopril, beta blockade  with metoprolol. Follow-up echocardiography showed left ventricular ejection fraction 65-70% with abnormal left ventricle relaxation. Patient will be discharged on furosemide daily, heart failure teaching, fluid restriction, salt restricted diet.   2. Liver masses suspected metastasis from primary lung cancer. Further workup for elevated LFTs included a ultrasound which showed multiple heterogeneous hepatic masses, measure up to 6 cm in size, liver MRI with diffuse hypovascular liver metastasis. 3 cm right adrenal mass. CT of the abdomen and chest showed right hilar adenopathy, with a possible obstructive collapse of the right middle lobe, nodular lesion in the right middle lobe, and large right paratracheal metastatic adenopathy, multiple hepatic metastasis, right adrenal gland metastasis. Patient was scheduled for IR liver biopsy. Over the course of her hospitalization her hemoglobin dropped to 8.4. In the setting of low hemoglobin and patient Jehovah witness, will wait to schedule procedures outpatient once hemoglobin more stable. Patient was offered to have the procedure done on this hospitalization, she was also made aware of risk of bleeding, risk of getting to a critical low hemoglobin considering her current anemia. She express not willing to get blood transfusions under any circumstance. She agrees to have procedure done as an outpatient. This procedure will have to be rescheduled in the outpatient clinic.  3. Hypertension. Patient was placed on metoprolol and lisinopril. Blood pressure was noted to be low to to aggressive diuresis. She will resume her antihypertensive agents but at time of discharge.  4. Type 2 diabetes mellitus. Patient was placed on insulin signed scale for coverage and monitoring. Patient was placed on basal insulin of glargine 20 units twice a day. At time of discharge she will resume Januvia.    Discharge Diagnoses:  Principal Problem:   Acute on chronic diastolic  CHF  (congestive heart failure) (HCC) Active Problems:   Hypertension   Hyperlipidemia   Diabetes mellitus, type 2 (HCC)   Abnormal LFTs   CHF exacerbation [facility])    Discharge Instructions  Discharge Instructions    AMB Referral to [facility]    Complete by:  As directed    Reason for consult:  Currently patient needs support dealing with new diagnosis and medications   Diagnoses of:   Cancer with Mets Diabetes     Expected date of contact:  1-3 days (reserved for hospital discharges)     Allergies as of 12/19/2016      Reactions   Sulfa Antibiotics Anaphylaxis, Hives   Other Nausea And Vomiting   Milk products      Medication List    STOP taking these medications   atorvastatin 20 MG tablet Commonly known as:  LIPITOR   benzonatate 100 MG capsule Commonly known as:  TESSALON PERLES   glucose blood test strip Commonly known as:  FREESTYLE TEST STRIPS   meloxicam 15 MG tablet Commonly known as:  MOBIC     TAKE these medications   aspirin EC 81 MG tablet Take 81 mg by mouth daily.   fluticasone 50 MCG/ACT nasal spray Commonly known as:  FLONASE Place 1-2 sprays into both nostrils daily as needed for allergies.   furosemide 20 MG tablet Commonly known as:  LASIX Take 1 tablet (20 mg total) by mouth daily. Start taking on:  12/20/2016   gabapentin 300 MG capsule Commonly known as:  NEURONTIN Take 1 capsule (300 mg total) by mouth at bedtime.   lisinopril-hydrochlorothiazide 20-12.5 MG tablet Commonly known as:  PRINZIDE,ZESTORETIC Take 1 tablet by mouth daily.   metoprolol tartrate 25 MG tablet Commonly known as:  LOPRESSOR Take 0.5 tablets (12.5 mg total) by mouth 2 (two) times daily.   potassium chloride 10 MEQ tablet Commonly known as:  K-DUR Take 1 tablet (10 mEq total) by mouth daily.   sitaGLIPtin 100 MG tablet Commonly known as:  JANUVIA Take 1 tablet (100 mg total) by mouth daily.       Allergies  Allergen Reactions  . Sulfa  Antibiotics Anaphylaxis and Hives  . Other Nausea And Vomiting    Milk products    Consultations:  Interventional Radiology  Gastroenterology    Procedures/Studies: Dg Chest 2 View  Result Date: 12/09/2016 CLINICAL DATA:  Shortness of breath, nausea and dry cough since October, 2017. EXAM: CHEST  2 VIEW COMPARISON:  Single-view of the chest 05/11/2015. FINDINGS: Small focus of airspace disease is seen in the anterior right upper lobe. The left lung is clear. Heart size is normal. No pneumothorax or pleural effusion. Aortic atherosclerosis is noted. No acute abnormality. IMPRESSION: Airspace disease in the anterior right upper lobe has an appearance most compatible with atelectasis but could be due to pneumonia. Atherosclerosis. Electronically Signed   By: Inge Rise M.D.   On: 12/09/2016 08:51   Ct Chest W Contrast  Result Date: 12/16/2016 CLINICAL DATA:  Liver mass on ultrasound and MRI. Elevated liver enzymes. EXAM: CT ABDOMEN AND PELVIS WITH CONTRAST TECHNIQUE: Multidetector CT imaging of the abdomen and pelvis was performed using the standard protocol following bolus administration of intravenous contrast. CONTRAST:  54m ISOVUE-300 IOPAMIDOL (ISOVUE-300) INJECTION 61% COMPARISON:  MRI 12/14/2016 FINDINGS: CT CHEST FINDINGS Cardiovascular: No significant vascular findings. Normal heart size. No pericardial effusion. Mediastinum/Nodes: No axillary supraclavicular adenopathy. Small 15 mm nodule in the LEFT lobe of thyroid gland. Enlarged  RIGHT lower paratracheal lymph nodes which extend into the RIGHT hilum. RIGHT lower paratracheal lymph node measures 18 mm short axis. RIGHT hilar lymph node measures 16 mm. There is postobstructive atelectasis of the RIGHT middle lobe associated with this hilar adenopathy/mass. Lungs/Pleura: RIGHT middle lobe atelectasis. Linear nodular thickening adjacent to the atelectasis measuring 21 by 19 mm (image 55, series 8). Small peripheral nodule measuring 8 mm  on image 57, series 5. Musculoskeletal: No aggressive osseous lesion. CT ABDOMEN AND PELVIS FINDINGS Hepatobiliary: Multiple low-attenuation lesions with peripheral enhancement within LEFT and RIGHT hepatic lobes as described on comparison MRI. Central abdominal lesion measures 53 mm (image 44, series 3). Large RIGHT hepatic lobe lesion measures 84 mm x 55 mm. LEFT lateral hepatic lobe lesion measures 27 by 27 mm image 55, series 3. There are approximately 30 lesions within liver. No biliary duct dilatation. Postcholecystectomy. Common bile duct is dilated to 12 mm presumably related to cholecystectomy. Pancreas: Pancreas is normal. No ductal dilatation. No pancreatic inflammation. Spleen: Normal spleen Adrenals/urinary tract: RIGHT adrenal gland is enlarged to 22 by 31 mm and enhances. Kidneys ureters normal. Bladder normal. Stomach/Bowel: Post gastric bypass anatomy. No bowel obstruction. Appendix normal. Several diverticula of the colon without acute inflammation. Vascular/Lymphatic: Abdominal aorta is normal caliber. There is no retroperitoneal or periportal lymphadenopathy. No pelvic lymphadenopathy. Reproductive: Uterus and ovaries normal Other: No free fluid. Musculoskeletal: No aggressive osseous lesion. IMPRESSION: Chest Impression: 1. RIGHT hilar adenopathy / mass with postobstructive collapse of the RIGHT middle lobe is suggestive of SMALL CELL LUNG CANCER. 2. Nodular lesion in the RIGHT middle lobe adjacent to the atelectasis may represent primary malignancy. 3. Enlarged RIGHT paratracheal metastatic adenopathy. Abdomen / Pelvis Impression: 1. Multiple hepatic metastasis as described on comparison MRI. 2. RIGHT adrenal gland metastasis. 3. No metastatic adenopathy in the abdomen pelvis. 4. Postsurgical change consistent with cholecystectomy and bariatric surgery Electronically Signed   By: Suzy Bouchard M.D.   On: 12/16/2016 10:24   Ct Abdomen Pelvis W Contrast  Result Date: 12/16/2016 CLINICAL  DATA:  Liver mass on ultrasound and MRI. Elevated liver enzymes. EXAM: CT ABDOMEN AND PELVIS WITH CONTRAST TECHNIQUE: Multidetector CT imaging of the abdomen and pelvis was performed using the standard protocol following bolus administration of intravenous contrast. CONTRAST:  54m ISOVUE-300 IOPAMIDOL (ISOVUE-300) INJECTION 61% COMPARISON:  MRI 12/14/2016 FINDINGS: CT CHEST FINDINGS Cardiovascular: No significant vascular findings. Normal heart size. No pericardial effusion. Mediastinum/Nodes: No axillary supraclavicular adenopathy. Small 15 mm nodule in the LEFT lobe of thyroid gland. Enlarged RIGHT lower paratracheal lymph nodes which extend into the RIGHT hilum. RIGHT lower paratracheal lymph node measures 18 mm short axis. RIGHT hilar lymph node measures 16 mm. There is postobstructive atelectasis of the RIGHT middle lobe associated with this hilar adenopathy/mass. Lungs/Pleura: RIGHT middle lobe atelectasis. Linear nodular thickening adjacent to the atelectasis measuring 21 by 19 mm (image 55, series 8). Small peripheral nodule measuring 8 mm on image 57, series 5. Musculoskeletal: No aggressive osseous lesion. CT ABDOMEN AND PELVIS FINDINGS Hepatobiliary: Multiple low-attenuation lesions with peripheral enhancement within LEFT and RIGHT hepatic lobes as described on comparison MRI. Central abdominal lesion measures 53 mm (image 44, series 3). Large RIGHT hepatic lobe lesion measures 84 mm x 55 mm. LEFT lateral hepatic lobe lesion measures 27 by 27 mm image 55, series 3. There are approximately 30 lesions within liver. No biliary duct dilatation. Postcholecystectomy. Common bile duct is dilated to 12 mm presumably related to cholecystectomy. Pancreas: Pancreas is normal. No ductal  dilatation. No pancreatic inflammation. Spleen: Normal spleen Adrenals/urinary tract: RIGHT adrenal gland is enlarged to 22 by 31 mm and enhances. Kidneys ureters normal. Bladder normal. Stomach/Bowel: Post gastric bypass anatomy. No  bowel obstruction. Appendix normal. Several diverticula of the colon without acute inflammation. Vascular/Lymphatic: Abdominal aorta is normal caliber. There is no retroperitoneal or periportal lymphadenopathy. No pelvic lymphadenopathy. Reproductive: Uterus and ovaries normal Other: No free fluid. Musculoskeletal: No aggressive osseous lesion. IMPRESSION: Chest Impression: 1. RIGHT hilar adenopathy / mass with postobstructive collapse of the RIGHT middle lobe is suggestive of SMALL CELL LUNG CANCER. 2. Nodular lesion in the RIGHT middle lobe adjacent to the atelectasis may represent primary malignancy. 3. Enlarged RIGHT paratracheal metastatic adenopathy. Abdomen / Pelvis Impression: 1. Multiple hepatic metastasis as described on comparison MRI. 2. RIGHT adrenal gland metastasis. 3. No metastatic adenopathy in the abdomen pelvis. 4. Postsurgical change consistent with cholecystectomy and bariatric surgery Electronically Signed   By: Suzy Bouchard M.D.   On: 12/16/2016 10:24   Mr Liver W WU Contrast  Result Date: 12/14/2016 CLINICAL DATA:  Elevated liver function tests. Liver masses seen on recent ultrasound. EXAM: MRI ABDOMEN WITHOUT AND WITH CONTRAST TECHNIQUE: Multiplanar multisequence MR imaging of the abdomen was performed both before and after the administration of intravenous contrast. CONTRAST:  54m MULTIHANCE GADOBENATE DIMEGLUMINE 529 MG/ML IV SOLN COMPARISON:  Ultrasound on 12/12/2016 FINDINGS: Lower chest: No acute findings. Hepatobiliary: Innumerable hypovascular metastases are seen throughout the right and left hepatic lobes. Majority of the larger metastases show central necrosis. Dominant metastatic lesion in the right hepatic lobe measures 9.9 x 7.1 cm on image 58/1101. Prior cholecystectomy noted. No evidence of biliary dilatation. Pancreas:  No mass or inflammatory changes. Spleen: A 2.5 x 3.1 cm homogeneous right adrenal mass is seen which has nonspecific characteristics. This mass does  show restricted diffusion, which favors an adrenal metastasis over adrenal adenoma. Left adrenal gland is normal in appearance. No renal masses are identified.  No evidence of hydronephrosis. Adrenals/Urinary Tract: No masses identified. No evidence of hydronephrosis. Stomach/Bowel: Visualized portions within the abdomen are unremarkable. Vascular/Lymphatic: No pathologically enlarged lymph nodes identified. No abdominal aortic aneurysm. Other:  None. Musculoskeletal:  No suspicious bone lesions identified. IMPRESSION: Diffuse hypovascular liver metastases. 3 cm right adrenal mass, suspicious for metastasis although lipid-poor adrenal adenoma is a consideration. Electronically Signed   By: JEarle GellM.D.   On: 12/14/2016 18:36   Dg Abdomen Acute W/chest  Result Date: 12/12/2016 CLINICAL DATA:  Cough, upper abdominal pain EXAM: DG ABDOMEN ACUTE W/ 1V CHEST COMPARISON:  12/09/2016 and 05/11/2015 CXR FINDINGS: There is no evidence of dilated bowel loops or free intraperitoneal air. Surgical clips are seen in the upper abdomen bilaterally in the right upper quadrant and epigastric region as well as left lower quadrant. No radiopaque calculi. Lumbar degenerative changes are seen. Mild joint space narrowing of both hips. No acute osseous appearing abnormality. Heart size and mediastinal contours are within normal limits. Ovoid density along the minor fissure is noted which may reflect a pseudo lesion with trace fluid or atelectasis along side or within the fissure. IMPRESSION: Negative abdominal radiographs. No acute cardiopulmonary disease. Fluid or atelectasis along side the minor fissure as on recent comparison study. Electronically Signed   By: DAshley RoyaltyM.D.   On: 12/12/2016 00:39   [place]domen Limited Ruq  Result Date: 12/12/2016 CLINICAL DATA:  Acute onset of abnormal LFTs.  Initial encounter. EXAM: [place]DOM[place] LIMITED - RIGHT UPPER QUADRANT COMPARISON:  None.  FINDINGS: Gallbladder: Status post  cholecystectomy.  No retained stones seen. Common bile duct: Diameter: 1.2 cm, likely within normal limits status post cholecystectomy. Liver: Multiple masses are noted within the liver, diffusely heterogeneous in appearance. There is a complex 5.8 x 5.6 x 5.5 cm mass at the right hepatic lobe, a 6.0 x 4.7 x 3.5 cm mass also at the right hepatic lobe, and a 4.7 x 4.5 x 3.2 cm mass at the left hepatic lobe. There is diffuse heterogeneity of hepatic echogenicity. IMPRESSION: Multiple heterogeneous hepatic masses noted, measuring up to 6 cm in size. These are difficult to fully characterize on ultrasound. Dynamic liver protocol MRI or CT is recommended for further evaluation. Electronically Signed   By: Garald Balding M.D.   On: 12/12/2016 04:55       Subjective: Patient with no chest pain, no nausea or vomiting. Tolerating po well. No dyspnea.   Discharge Exam: Vitals:   12/19/16 0819 12/19/16 1000  BP: (!) 100/57 (!) 98/51  Pulse: 89 81  Resp:    Temp:     Vitals:   12/18/16 1956 12/19/16 0545 12/19/16 0819 12/19/16 1000  BP: (!) 109/53 102/73 (!) 100/57 (!) 98/51  Pulse: 92 (!) 105 89 81  Resp: 18 18    Temp: 98.3 F (36.8 C) 97.9 F (36.6 C)    TempSrc: Oral Oral    SpO2: 97% 98%    Weight:  100.5 kg (221 lb 8 oz)    Height:        General: Pt is alert, awake, not in acute distress. Positive pallor.   Cardiovascular: RRR, S1/S2 +, no rubs, no gallops Respiratory: CTA bilaterally, no wheezing, no rhonchi Abdominal: Soft, NT, ND, bowel sounds + Extremities: no edema, no cyanosis    The results of significant diagnostics from this hospitalization (including imaging, microbiology, ancillary and laboratory) are listed below for reference.     Microbiology: Recent Results (from the past 240 hour(s))  Urine culture     Status: Abnormal   Collection Time: 12/11/16  6:02 PM  Result Value Ref Range Status   Specimen Description URINE, RANDOM  Final   Special Requests NONE   Final   Culture (A)  Final    >=100,000 COLONIES/mL GROUP B STREP(S.AGALACTIAE)ISOLATED TESTING AGAINST S. AGALACTIAE NOT ROUTINELY PERFORMED DUE TO PREDICTABILITY OF AMP/PEN/VAN SUSCEPTIBILITY. 20,000 COLONIES/mL ESCHERICHIA COLI    Report Status 12/14/2016 FINAL  Final   Organism ID, Bacteria ESCHERICHIA COLI (A)  Final      Susceptibility   Escherichia coli - MIC*    AMPICILLIN <=2 SENSITIVE Sensitive     CEFAZOLIN <=4 SENSITIVE Sensitive     CEFTRIAXONE <=1 SENSITIVE Sensitive     CIPROFLOXACIN <=0.25 SENSITIVE Sensitive     GENTAMICIN <=1 SENSITIVE Sensitive     IMIPENEM <=0.25 SENSITIVE Sensitive     NITROFURANTOIN <=16 SENSITIVE Sensitive     TRIMETH/SULFA <=20 SENSITIVE Sensitive     AMPICILLIN/SULBACTAM <=2 SENSITIVE Sensitive     PIP/TAZO <=4 SENSITIVE Sensitive     Extended ESBL NEGATIVE Sensitive     * 20,000 COLONIES/mL ESCHERICHIA COLI     Labs: BNP (last 3 results)  Recent Labs  12/12/16 0357  BNP 76.1   Basic Metabolic Panel:  Recent Labs Lab 12/14/16 0321 12/14/16 1405 12/15/16 0246 12/16/16 0343 12/17/16 0301 12/18/16 0508  NA 137  --  138 139 139 138  K 2.6* 3.6 3.2* 3.3* 3.5 4.4  CL 89*  --  94* 93* 93* 95*  CO2 38*  --  35* 34* 36* 32  GLUCOSE 299*  --  159* 174* 155* 181*  BUN 25*  --  30* 32* 44* 46*  CREATININE 0.75  --  0.81 0.69 0.96 0.88  CALCIUM 8.8*  --  8.8* 9.0 9.2 9.1  MG  --   --  2.1  --   --   --    Liver Function Tests:  Recent Labs Lab 12/13/16 0700 12/14/16 0321 12/15/16 0246  AST 132* 112* 142*  ALT 238* 228* 237*  ALKPHOS 100 89 89  BILITOT 1.0 0.6 0.8  PROT 6.5 5.4* 5.3*  ALBUMIN 3.3* 2.8* 2.9*   No results for input(s): LIPASE, AMYLASE in the last 168 hours. No results for input(s): AMMONIA in the last 168 hours. CBC:  Recent Labs Lab 12/13/16 0700 12/14/16 0321 12/14/16 1627 12/19/16 0756 12/19/16 1141  WBC 12.4* 10.9* 15.0* 16.3* 15.6*  NEUTROABS  --   --  13.1*  --   --   HGB 11.9* 10.4* 11.4*  8.2* 8.4*  HCT 36.4 32.4* 36.2 26.3* 26.6*  MCV 84.3 84.6 85.8 86.2 86.4  PLT 286 226 305 333 383   Cardiac Enzymes:  Recent Labs Lab 12/12/16 1556  TROPONINI <0.03   BNP: Invalid input(s): POCBNP CBG:  Recent Labs Lab 12/18/16 1123 12/18/16 1640 12/18/16 2130 12/19/16 0728 12/19/16 1134  GLUCAP 203* 188* 162* 190* 204*   D-Dimer No results for input(s): DDIMER in the last 72 hours. Hgb A1c No results for input(s): HGBA1C in the last 72 hours. Lipid Profile No results for input(s): CHOL, HDL, LDLCALC, TRIG, CHOLHDL, LDLDIRECT in the last 72 hours. Thyroid function studies No results for input(s): TSH, T4TOTAL, T3FREE, THYROIDAB in the last 72 hours.  Invalid input(s): FREET3 Anemia work up No results for input(s): VITAMINB12, FOLATE, FERRITIN, TIBC, IRON, RETICCTPCT in the last 72 hours. Urinalysis    Component Value Date/Time   COLORURINE YELLOW 12/11/2016 1802   APPEARANCEUR CLEAR 12/11/2016 1802   LABSPEC <1.005 (L) 12/11/2016 1802   PHURINE 5.5 12/11/2016 1802   GLUCOSEU >=500 (A) 12/11/2016 1802   HGBUR NEGATIVE 12/11/2016 1802   BILIRUBINUR NEGATIVE 12/11/2016 1802   KETONESUR NEGATIVE 12/11/2016 1802   PROTEINUR NEGATIVE 12/11/2016 1802   NITRITE NEGATIVE 12/11/2016 1802   LEUKOCYTESUR NEGATIVE 12/11/2016 1802   Sepsis Labs Invalid input(s): PROCALCITONIN,  WBC,  LACTICIDVEN Microbiology Recent Results (from the past 240 hour(s))  Urine culture     Status: Abnormal   Collection Time: 12/11/16  6:02 PM  Result Value Ref Range Status   Specimen Description URINE, RANDOM  Final   Special Requests NONE  Final   Culture (A)  Final    >=100,000 COLONIES/mL GROUP B STREP(S.AGALACTIAE)ISOLATED TESTING AGAINST S. AGALACTIAE NOT ROUTINELY PERFORMED DUE TO PREDICTABILITY OF AMP/PEN/VAN SUSCEPTIBILITY. 20,000 COLONIES/mL ESCHERICHIA COLI    Report Status 12/14/2016 FINAL  Final   Organism ID, Bacteria ESCHERICHIA COLI (A)  Final      Susceptibility    Escherichia coli - MIC*    AMPICILLIN <=2 SENSITIVE Sensitive     CEFAZOLIN <=4 SENSITIVE Sensitive     CEFTRIAXONE <=1 SENSITIVE Sensitive     CIPROFLOXACIN <=0.25 SENSITIVE Sensitive     GENTAMICIN <=1 SENSITIVE Sensitive     IMIPENEM <=0.25 SENSITIVE Sensitive     NITROFURANTOIN <=16 SENSITIVE Sensitive     TRIMETH/SULFA <=20 SENSITIVE Sensitive     AMPICILLIN/SULBACTAM <=2 SENSITIVE Sensitive     PIP/TAZO <=4 SENSITIVE Sensitive  Extended ESBL NEGATIVE Sensitive     * 20,000 COLONIES/mL ESCHERICHIA COLI     Time coordinating discharge: 45 minutes  SIGNED:   Tawni Millers, MD  [facility] 12/19/2016, 1:12 PM Pager   If 7PM-7AM, please contact night-coverage [email or web address] Password [email or web address]

## 2016-12-19 NOTE — Progress Notes (Signed)
Nutrition Follow-up  DOCUMENTATION CODES:   Obesity unspecified  INTERVENTION:  D/C Premier Protein and Glucerna Shakes Snacks   NUTRITION DIAGNOSIS:   Inadequate oral intake related to poor appetite as evidenced by per patient/family report, energy intake < 75% for > 7 days.  ongoing  GOAL:   Patient will meet greater than or equal to 90% of their needs  unmet  MONITOR:   PO intake, Labs, I & O's, Weight trends  REASON FOR ASSESSMENT:   Malnutrition Screening Tool    ASSESSMENT:   71 y.o. female with medical history significant of Jehovah witness, hypertension, hyperlipidemia, diabetes mellitus, depression, dCHF, who presents with generalized weakness, elevated blood sugar, shortness breath.  Pt states that her eating has improved slightly- eating 50% of most meals vs 25% PTA. Per nursing notes, pt is eating 50% to 100% of meals. Pt states that she has not been drinking the nutritional supplements because they cause her to have reflux. Her family has been bringing her healthful low sodium snacks from home and she has been snacking on peanut butter and crackers. Pt's weight has been stable for the past 5 days. She denies any additional needs and hopes to be discharged later today.   Labs: low hemoglobin, elevated glucose, low chloride, elevated BUN  Diet Order:  Diet NPO time specified Except for: Sips with Meds  Skin:  Reviewed, no issues  Last BM:  4/15  Height:   Ht Readings from Last 1 Encounters:  12/12/16 '5\' 6"'$  (1.676 m)    Weight:   Wt Readings from Last 1 Encounters:  12/19/16 221 lb 8 oz (100.5 kg)    Ideal Body Weight:  59.09 kg  BMI:  Body mass index is 35.75 kg/m.  Estimated Nutritional Needs:   Kcal:  1600-1800  Protein:  80-95 grams  Fluid:  2 L/day  EDUCATION NEEDS:   Education needs addressed  Scarlette Ar RD, LDN, CSP Inpatient Clinical Dietitian Pager: [phone] After Hours Pager: [phone]

## 2016-12-19 NOTE — Progress Notes (Signed)
Radiology called and stated they need MD sign off for procedure due to pt hemoglobin drop from 11.4 on 4/11 to 8.2 on 4/16. Pt blood pressure 98/51. Awaiting call back  Allon Costlow Snyder

## 2016-12-20 ENCOUNTER — Other Ambulatory Visit: Payer: Self-pay | Admitting: *Deleted

## 2016-12-20 ENCOUNTER — Encounter: Payer: Self-pay | Admitting: Emergency Medicine

## 2016-12-20 ENCOUNTER — Inpatient Hospital Stay
Admission: EM | Admit: 2016-12-20 | Discharge: 2016-12-23 | DRG: 377 | Disposition: A | Discharge status: Home Health | Payer: Medicare HMO | Attending: Internal Medicine | Admitting: Internal Medicine

## 2016-12-20 ENCOUNTER — Emergency Department: Payer: Medicare HMO

## 2016-12-20 DIAGNOSIS — K922 Gastrointestinal hemorrhage, unspecified: Secondary | ICD-10-CM | POA: Diagnosis not present

## 2016-12-20 DIAGNOSIS — Z7982 Long term (current) use of aspirin: Secondary | ICD-10-CM

## 2016-12-20 DIAGNOSIS — R918 Other nonspecific abnormal finding of lung field: Secondary | ICD-10-CM | POA: Diagnosis not present

## 2016-12-20 DIAGNOSIS — E669 Obesity, unspecified: Secondary | ICD-10-CM | POA: Diagnosis present

## 2016-12-20 DIAGNOSIS — K921 Melena: Principal | ICD-10-CM | POA: Diagnosis present

## 2016-12-20 DIAGNOSIS — Z818 Family history of other mental and behavioral disorders: Secondary | ICD-10-CM

## 2016-12-20 DIAGNOSIS — D62 Acute posthemorrhagic anemia: Secondary | ICD-10-CM | POA: Diagnosis not present

## 2016-12-20 DIAGNOSIS — Z91011 Allergy to milk products: Secondary | ICD-10-CM

## 2016-12-20 DIAGNOSIS — Z825 Family history of asthma and other chronic lower respiratory diseases: Secondary | ICD-10-CM | POA: Diagnosis not present

## 2016-12-20 DIAGNOSIS — E279 Disorder of adrenal gland, unspecified: Secondary | ICD-10-CM | POA: Diagnosis present

## 2016-12-20 DIAGNOSIS — Z87891 Personal history of nicotine dependence: Secondary | ICD-10-CM | POA: Diagnosis not present

## 2016-12-20 DIAGNOSIS — K769 Liver disease, unspecified: Secondary | ICD-10-CM | POA: Diagnosis not present

## 2016-12-20 DIAGNOSIS — Z96653 Presence of artificial knee joint, bilateral: Secondary | ICD-10-CM | POA: Diagnosis present

## 2016-12-20 DIAGNOSIS — E785 Hyperlipidemia, unspecified: Secondary | ICD-10-CM | POA: Diagnosis present

## 2016-12-20 DIAGNOSIS — E7800 Pure hypercholesterolemia, unspecified: Secondary | ICD-10-CM | POA: Diagnosis present

## 2016-12-20 DIAGNOSIS — Z9884 Bariatric surgery status: Secondary | ICD-10-CM

## 2016-12-20 DIAGNOSIS — Z6836 Body mass index (BMI) 36.0-36.9, adult: Secondary | ICD-10-CM | POA: Diagnosis not present

## 2016-12-20 DIAGNOSIS — Z801 Family history of malignant neoplasm of trachea, bronchus and lung: Secondary | ICD-10-CM

## 2016-12-20 DIAGNOSIS — C3490 Malignant neoplasm of unspecified part of unspecified bronchus or lung: Secondary | ICD-10-CM | POA: Diagnosis present

## 2016-12-20 DIAGNOSIS — I959 Hypotension, unspecified: Secondary | ICD-10-CM | POA: Diagnosis present

## 2016-12-20 DIAGNOSIS — D649 Anemia, unspecified: Secondary | ICD-10-CM | POA: Diagnosis not present

## 2016-12-20 DIAGNOSIS — R0602 Shortness of breath: Secondary | ICD-10-CM | POA: Diagnosis not present

## 2016-12-20 DIAGNOSIS — J189 Pneumonia, unspecified organism: Secondary | ICD-10-CM | POA: Diagnosis present

## 2016-12-20 DIAGNOSIS — Z882 Allergy status to sulfonamides status: Secondary | ICD-10-CM | POA: Diagnosis not present

## 2016-12-20 DIAGNOSIS — I5033 Acute on chronic diastolic (congestive) heart failure: Secondary | ICD-10-CM | POA: Diagnosis not present

## 2016-12-20 DIAGNOSIS — D500 Iron deficiency anemia secondary to blood loss (chronic): Secondary | ICD-10-CM

## 2016-12-20 DIAGNOSIS — J9811 Atelectasis: Secondary | ICD-10-CM | POA: Diagnosis not present

## 2016-12-20 DIAGNOSIS — R160 Hepatomegaly, not elsewhere classified: Secondary | ICD-10-CM | POA: Diagnosis present

## 2016-12-20 DIAGNOSIS — I110 Hypertensive heart disease with heart failure: Secondary | ICD-10-CM | POA: Diagnosis present

## 2016-12-20 DIAGNOSIS — Y95 Nosocomial condition: Secondary | ICD-10-CM | POA: Diagnosis present

## 2016-12-20 DIAGNOSIS — F329 Major depressive disorder, single episode, unspecified: Secondary | ICD-10-CM | POA: Diagnosis not present

## 2016-12-20 DIAGNOSIS — Z79899 Other long term (current) drug therapy: Secondary | ICD-10-CM

## 2016-12-20 DIAGNOSIS — Z7984 Long term (current) use of oral hypoglycemic drugs: Secondary | ICD-10-CM

## 2016-12-20 DIAGNOSIS — Z8249 Family history of ischemic heart disease and other diseases of the circulatory system: Secondary | ICD-10-CM

## 2016-12-20 DIAGNOSIS — E1165 Type 2 diabetes mellitus with hyperglycemia: Secondary | ICD-10-CM | POA: Diagnosis not present

## 2016-12-20 DIAGNOSIS — Z66 Do not resuscitate: Secondary | ICD-10-CM | POA: Diagnosis not present

## 2016-12-20 DIAGNOSIS — Z9049 Acquired absence of other specified parts of digestive tract: Secondary | ICD-10-CM

## 2016-12-20 DIAGNOSIS — R739 Hyperglycemia, unspecified: Secondary | ICD-10-CM | POA: Diagnosis not present

## 2016-12-20 DIAGNOSIS — Z841 Family history of disorders of kidney and ureter: Secondary | ICD-10-CM | POA: Diagnosis not present

## 2016-12-20 DIAGNOSIS — E1142 Type 2 diabetes mellitus with diabetic polyneuropathy: Secondary | ICD-10-CM | POA: Diagnosis present

## 2016-12-20 DIAGNOSIS — Z803 Family history of malignant neoplasm of breast: Secondary | ICD-10-CM

## 2016-12-20 LAB — URINALYSIS, COMPLETE (UACMP) WITH MICROSCOPIC
Bilirubin Urine: NEGATIVE
Glucose, UA: 150 mg/dL — AB
Hgb urine dipstick: NEGATIVE
KETONES UR: NEGATIVE mg/dL
Nitrite: NEGATIVE
PH: 5 (ref 5.0–8.0)
Protein, ur: NEGATIVE mg/dL
Specific Gravity, Urine: 1.017 (ref 1.005–1.030)

## 2016-12-20 LAB — GLUCOSE, CAPILLARY
Glucose-Capillary: 406 mg/dL — ABNORMAL HIGH (ref 65–99)
Glucose-Capillary: 441 mg/dL — ABNORMAL HIGH (ref 65–99)
Glucose-Capillary: 297 mg/dL — ABNORMAL HIGH (ref 65–99)

## 2016-12-20 LAB — CBC
HCT: 23.2 % — ABNORMAL LOW (ref 35.0–47.0)
HEMOGLOBIN: 7.5 g/dL — AB (ref 12.0–16.0)
MCH: 27.5 pg (ref 26.0–34.0)
MCHC: 32.2 g/dL (ref 32.0–36.0)
MCV: 85.5 fL (ref 80.0–100.0)
Platelets: 435 10*3/uL (ref 150–440)
RBC: 2.71 MIL/uL — ABNORMAL LOW (ref 3.80–5.20)
RDW: 14.8 % — ABNORMAL HIGH (ref 11.5–14.5)
WBC: 17 10*3/uL — ABNORMAL HIGH (ref 3.6–11.0)

## 2016-12-20 LAB — BRAIN NATRIURETIC PEPTIDE: B Natriuretic Peptide: 91 pg/mL (ref 0.0–100.0)

## 2016-12-20 LAB — BASIC METABOLIC PANEL
ANION GAP: 11 (ref 5–15)
BUN: 53 mg/dL — ABNORMAL HIGH (ref 6–20)
CALCIUM: 8.7 mg/dL — AB (ref 8.9–10.3)
CHLORIDE: 93 mmol/L — AB (ref 101–111)
CO2: 31 mmol/L (ref 22–32)
CREATININE: 0.98 mg/dL (ref 0.44–1.00)
GFR calc Af Amer: >60 mL/min (ref >60)
GFR calc non Af Amer: 58 mL/min — ABNORMAL LOW (ref >60)
GLUCOSE: 448 mg/dL — AB (ref 65–99)
Potassium: 4 mmol/L (ref 3.5–5.1)
Sodium: 135 mmol/L (ref 135–145)

## 2016-12-20 MED ORDER — SODIUM CHLORIDE 0.9 % IV SOLN
8.0000 mg/h | INTRAVENOUS | Status: DC
  Administered 2016-12-20 – 2016-12-22 (×4): 8 mg/h via INTRAVENOUS
  Filled 2016-12-20 (×4): qty 80

## 2016-12-20 MED ORDER — CEFEPIME-DEXTROSE 1 GM/50ML IV SOLR
1.0000 g | Freq: Once | INTRAVENOUS | Status: AC
  Administered 2016-12-20: 1 g via INTRAVENOUS
  Filled 2016-12-20: qty 50

## 2016-12-20 MED ORDER — INSULIN ASPART 100 UNIT/ML ~~LOC~~ SOLN
0.0000 [IU] | Freq: Three times a day (TID) | SUBCUTANEOUS | Status: DC
  Administered 2016-12-21: 9 [IU] via SUBCUTANEOUS
  Administered 2016-12-21: 3 [IU] via SUBCUTANEOUS
  Administered 2016-12-21: 5 [IU] via SUBCUTANEOUS
  Administered 2016-12-22: 3 [IU] via SUBCUTANEOUS
  Administered 2016-12-22 (×2): 5 [IU] via SUBCUTANEOUS
  Administered 2016-12-23 (×2): 2 [IU] via SUBCUTANEOUS
  Filled 2016-12-20: qty 3
  Filled 2016-12-20: qty 2
  Filled 2016-12-20 (×2): qty 5
  Filled 2016-12-20: qty 9
  Filled 2016-12-20: qty 2
  Filled 2016-12-20: qty 3
  Filled 2016-12-20: qty 5

## 2016-12-20 MED ORDER — SODIUM CHLORIDE 0.9 % IV SOLN
400.0000 mg | Freq: Once | INTRAVENOUS | Status: AC
  Administered 2016-12-21: 400 mg via INTRAVENOUS
  Filled 2016-12-20: qty 20

## 2016-12-20 MED ORDER — PANTOPRAZOLE SODIUM 40 MG IV SOLR: 40.0000 mg | Freq: Two times a day (BID) | INTRAVENOUS | Status: DC

## 2016-12-20 MED ORDER — GABAPENTIN 300 MG PO CAPS
300.0000 mg | ORAL_CAPSULE | Freq: Every day | ORAL | Status: DC
  Administered 2016-12-21 – 2016-12-22 (×3): 300 mg via ORAL
  Filled 2016-12-20 (×3): qty 1

## 2016-12-20 MED ORDER — INSULIN ASPART 100 UNIT/ML ~~LOC~~ SOLN
10.0000 [IU] | Freq: Once | SUBCUTANEOUS | Status: AC
  Administered 2016-12-20: 10 [IU] via INTRAVENOUS
  Filled 2016-12-20: qty 10

## 2016-12-20 MED ORDER — VANCOMYCIN HCL 10 G IV SOLR
1500.0000 mg | Freq: Once | INTRAVENOUS | Status: AC
  Administered 2016-12-20: 1500 mg via INTRAVENOUS
  Filled 2016-12-20: qty 1500

## 2016-12-20 MED ORDER — SODIUM CHLORIDE 0.9 % IV BOLUS (SEPSIS)
500.0000 mL | Freq: Once | INTRAVENOUS | Status: AC
  Administered 2016-12-20: 500 mL via INTRAVENOUS

## 2016-12-20 MED ORDER — SODIUM CHLORIDE 0.9 % IV SOLN
80.0000 mg | Freq: Once | INTRAVENOUS | Status: AC
  Administered 2016-12-20: 80 mg via INTRAVENOUS
  Filled 2016-12-20: qty 80

## 2016-12-20 MED ORDER — VANCOMYCIN HCL IN DEXTROSE 1-5 GM/200ML-% IV SOLN
1000.0000 mg | Freq: Two times a day (BID) | INTRAVENOUS | Status: DC
  Administered 2016-12-21: 1000 mg via INTRAVENOUS
  Filled 2016-12-20 (×2): qty 200

## 2016-12-20 MED ORDER — INSULIN ASPART 100 UNIT/ML ~~LOC~~ SOLN
0.0000 [IU] | Freq: Every day | SUBCUTANEOUS | Status: DC
  Administered 2016-12-21: 4 [IU] via SUBCUTANEOUS
  Administered 2016-12-21: 5 [IU] via SUBCUTANEOUS
  Administered 2016-12-22: 2 [IU] via SUBCUTANEOUS
  Filled 2016-12-20: qty 5
  Filled 2016-12-20: qty 4
  Filled 2016-12-20: qty 2

## 2016-12-20 MED ORDER — INSULIN GLARGINE 100 UNIT/ML ~~LOC~~ SOLN
10.0000 [IU] | Freq: Every day | SUBCUTANEOUS | Status: DC
  Administered 2016-12-21: 10 [IU] via SUBCUTANEOUS
  Filled 2016-12-20 (×2): qty 0.1

## 2016-12-20 MED ORDER — FUROSEMIDE 10 MG/ML IJ SOLN
40.0000 mg | Freq: Two times a day (BID) | INTRAMUSCULAR | Status: DC
  Administered 2016-12-21: 40 mg via INTRAVENOUS
  Filled 2016-12-20: qty 4

## 2016-12-20 MED ORDER — DEXTROSE 5 % IV SOLN
2.0000 g | Freq: Two times a day (BID) | INTRAVENOUS | Status: DC
  Administered 2016-12-21: 2 g via INTRAVENOUS
  Filled 2016-12-20 (×2): qty 2

## 2016-12-20 MED ORDER — FLUTICASONE PROPIONATE 50 MCG/ACT NA SUSP
1.0000 | Freq: Every day | NASAL | Status: DC
  Administered 2016-12-22 – 2016-12-23 (×2): 1 via NASAL
  Filled 2016-12-20: qty 16

## 2016-12-20 NOTE — Progress Notes (Signed)
Pharmacy Antibiotic Note  Mackenzie Park is a 71 y.o. female admitted on 12/20/2016 with pneumonia.  Pharmacy has been consulted for Cefepime and vancomycin dosing. Patient received Vancomycin '1500mg'$  IV x 1 dose and Cefepime '1mg'$  IV x 1 dose ini ED.   Plan: DW: 76kg    Ke: 0.058   T1/2: 11.95    VD: 53  Will start patient on vancomycin 1gm IV every 12 hours with 10 hour stack dosing. Calculated trough at Css is 18. Trough ordered prior to 4th dose. Recommended ordering MRSA PCR and discontinuing Vancomycin if MRSA PCR is negative.  Will start cefepime 2gm IV every 12 hours.   Height: '5\' 6"'$  (167.6 cm) Weight: 221 lb (100.2 kg) IBW/kg (Calculated) : 59.3  Temp (24hrs), Avg:98 F (36.7 C), Min:98 F (36.7 C), Max:98 F (36.7 C)   Recent Labs Lab 12/14/16 0321 12/14/16 1627 12/15/16 0246 12/16/16 0343 12/17/16 0301 12/18/16 0508 12/19/16 0756 12/19/16 1141 12/20/16 1741  WBC 10.9* 15.0*  --   --   --   --  16.3* 15.6* 17.0*  CREATININE 0.75  --  0.81 0.69 0.96 0.88  --   --  0.98    Estimated Creatinine Clearance: 64.7 mL/min (by C-G formula based on SCr of 0.98 mg/dL).    Allergies  Allergen Reactions  . Sulfa Antibiotics Anaphylaxis and Hives  . Other Nausea And Vomiting    Milk products    Antimicrobials this admission: 4/17 cefepime >>  4/17 vancomycin  >>   Dose adjustments this admission:   Microbiology results: 4/17 MRSA PCR: sent  Thank you for allowing pharmacy to be a part of this patient's care.  Pernell Dupre, PharmD, BCPS Clinical Pharmacist 12/20/2016 10:07 PM

## 2016-12-20 NOTE — H&P (Addendum)
[place] [facility] [place] NAME: Jacqlyn Marolf    MR#:  [id number]  DATE OF BIRTH:  1946-03-12  DATE OF ADMISSION:  12/20/2016  PRIMARY CARE PHYSICIAN: Keith Rake, MD   REQUESTING/REFERRING PHYSICIAN: Dr Gonzella Lex  CHIEF COMPLAINT:   Chief Complaint  Patient presents with  . Hyperglycemia    HISTORY OF PRESENT ILLNESS:  Dave Mannes  is a 71 y.o. female stated she can't get her sugars down. She was recently discharged from [facility]. She was told that she has a lung mass and some liver lesions. She was therefore congestive heart failure. She's not been doing well at home. She is having shortness of breath especially when lying flat. She states that she's having black tarry stools for the past 6 days. She's also had an ache in her right face. 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 Her hemoglobin dropped from 11.4 on 12/14/2016 down to 7.5 today. ER physician felt there may be pneumonia.  PAST MEDICAL HISTORY:   Past Medical History:  Diagnosis Date  . Allergic rhinitis   . CHF (congestive heart failure) ([place])   . Depressive disorder   . Diabetes mellitus ([place])   . Hypercholesteremia   . Hypertension   . Lumbosacral neuritis   . Osteoarthritis     PAST SURGICAL HISTORY:   Past Surgical History:  Procedure Laterality Date  . 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 at [facility].   . CHOLECYSTECTOMY    . REPLACEMENT TOTAL KNEE BILATERAL    . TUBAL LIGATION      SOCIAL HISTORY:   Social History  Substance Use Topics  . Smoking status: Former Smoker    Quit date: 08/20/1995  . Smokeless tobacco: Never Used  . Alcohol use No    FAMILY HISTORY:   Family History  Problem Relation Age of Onset  . Dementia Father   . COPD Sister   . Breast cancer Sister   . Lung cancer Sister   . Lung cancer Mother   . Heart failure Brother   . Kidney failure Brother      DRUG ALLERGIES:   Allergies  Allergen Reactions  . Sulfa Antibiotics Anaphylaxis and Hives  . Other Nausea And Vomiting    Milk products    REVIEW OF SYSTEMS:  CONSTITUTIONAL: No fever, Positive for fatigue and weakness.  EYES: No blurred or double vision.  EARS, NOSE, AND THROAT: No tinnitus or ear pain. No sore throat. Dysphasia to solids RESPIRATORY: Positive for cough, positive for shortness of breath, no wheezing or hemoptysis.  CARDIOVASCULAR: No chest pain, orthopnea, edema.  GASTROINTESTINAL: No nausea, vomiting. Occasional abdominal pain. Positive for black bowel movements GENITOURINARY: Occasional burning on urination ENDOCRINE: No polyuria, nocturia,  HEMATOLOGY: History of anemia SKIN: Bruising on the arms MUSCULOSKELETAL: No joint pain or arthritis.   NEUROLOGIC: No tingling, numbness, weakness.  PSYCHIATRY: No anxiety or depression.   MEDICATIONS AT HOME:   Prior to Admission medications   Medication Sig Start Date End Date Taking? Authorizing Provider  aspirin EC 81 MG tablet Take 81 mg by mouth daily.   Yes Historical Provider, MD  fluticasone (FLONASE) 50 MCG/ACT nasal spray Place 1-2 sprays into both nostrils daily as needed for allergies.    Yes Historical Provider, MD  furosemide (LASIX) 20 MG tablet Take 1 tablet (20 mg total) by mouth daily. 12/20/16  Yes Mauricio Gerome Apley, MD  gabapentin (NEURONTIN) 300 MG capsule Take 1 capsule (300 mg total) by mouth at bedtime. 11/08/16  Yes Roselee Nova, MD  lisinopril-hydrochlorothiazide (PRINZIDE,ZESTORETIC) 20-12.5 MG tablet Take 1 tablet by mouth daily. 11/08/16  Yes Roselee Nova, MD  metoprolol tartrate (LOPRESSOR) 25 MG tablet Take 0.5 tablets (12.5 mg total) by mouth 2 (two) times daily. 12/19/16  Yes Mauricio Gerome Apley, MD  potassium chloride (K-DUR) 10 MEQ tablet Take 1 tablet (10 mEq total) by mouth daily. 12/19/16  Yes Mauricio Gerome Apley, MD  sitaGLIPtin (JANUVIA) 100 MG tablet Take 1 tablet  (100 mg total) by mouth daily. 11/16/16  Yes Roselee Nova, MD      VITAL SIGNS:  Blood pressure (!) 107/51, pulse (!) 102, temperature 98 F (36.7 C), temperature source Oral, resp. rate (!) 22, height '5\' 6"'$  (1.676 m), weight 100.2 kg (221 lb), SpO2 99 %.  PHYSICAL EXAMINATION:  GENERAL:  71 y.o.-year-old patient lying in the bed with no acute distress.  EYES: Pupils equal, round, reactive to light and accommodation. No scleral icterus. Conjunctiva pale Extraocular muscles intact.  HEENT: Head atraumatic, normocephalic. Oropharynx and nasopharynx clear.  NECK:  Supple, no jugular venous distention. No thyroid enlargement, no tenderness.  LUNGS: Decreased breath sounds bilaterally, no wheezing, rales,rhonchi or crepitation. No use of accessory muscles of respiration.  CARDIOVASCULAR: S1, S2 tachycardic. No murmurs, rubs, or gallops.  ABDOMEN: Soft, nontender, nondistended. Bowel sounds present. No organomegaly or mass.  EXTREMITIES: 2+ edema, no cyanosis, or clubbing.  NEUROLOGIC: Cranial nerves II through XII are intact. Muscle strength 5/5 in all extremities. Sensation intact. Gait not checked.  PSYCHIATRIC: The patient is alert and oriented x 3.  SKIN: No rash, lesion, or ulcer.   LABORATORY PANEL:   CBC  Recent Labs Lab 12/20/16 1741  WBC 17.0*  HGB 7.5*  HCT 23.2*  PLT 435   ------------------------------------------------------------------------------------------------------------------  Chemistries   Recent Labs Lab 12/15/16 0246  12/20/16 1741  NA 138  < > 135  K 3.2*  < > 4.0  CL 94*  < > 93*  CO2 35*  < > 31  GLUCOSE 159*  < > 448*  BUN 30*  < > 53*  CREATININE 0.81  < > 0.98  CALCIUM 8.8*  < > 8.7*  MG 2.1  --   --   AST 142*  --   --   ALT 237*  --   --   ALKPHOS 89  --   --   BILITOT 0.8  --   --   < > = values in this interval not  displayed. ------------------------------------------------------------------------------------------------------------------   RADIOLOGY:  Dg Chest 2 View  Result Date: 12/20/2016 CLINICAL DATA:  Acute onset of hyperglycemia and leukocytosis. Initial encounter. EXAM: CHEST  2 VIEW COMPARISON:  Chest radiograph performed 09/13/2016, and CT of the chest performed 12/15/2016 FINDINGS: The known right hilar mass is not well characterized, with post-obstructive collapse again noted at the right midlung zone. No pleural effusion or pneumothorax is seen. The heart is normal in size. No acute osseous abnormalities are identified. IMPRESSION: Known right hilar mass is not well characterized. Post-obstructive collapse again noted at the right midlung zone. This could reflect mild pneumonia, depending on the patient's symptoms. Electronically Signed   By: Garald Balding M.D.   On: 12/20/2016 19:02      IMPRESSION AND PLAN:   1. Upper GI bleed with melena. Acute blood loss anemia. Hemoglobin dropped from 11.4 down to 7.5.  Patient is a Restaurant manager, fast food. IV Protonix drip started. Aspirin stopped. Clear liquid diet tonight and nothing by mouth after midnight. Case discussed with Dr. Vicente Males gastroenterology to evaluate the patient in the morning. The patient will not take any transfusions. I will give IV iron today. 2. Lung mass and liver masses and adrenal mass. Likely metastatic lung cancer. Oncology consultation. Patient is a DO NOT RESUSCITATE. Will need better hemoglobin prior to biopsy. 3. Acute on chronic diastolic congestive heart failure. IV Lasix 40 mg IV twice a day. Blood pressure too low for other medications at this point. 4. Relative hypotension. Likely with blood loss. I would rather not give IV fluids of vital have to to further dilute the hemoglobin. Unable to give blood. Hold antihypertensive medications except for Lasix at this point 5. Type 2 diabetes mellitus uncontrolled sugars. Start  sliding scale and low dose Lantus. 6. Palliative care consultation for goals of care 7. Weakness physical therapy evaluation   All the records are reviewed and case discussed with ED provider. Management plans discussed with the patient, family and they are in agreement.  CODE STATUS: DO NOT RESUSCITATE  TOTAL TIME TAKING CARE OF THIS PATIENT: 60 minutes, Including time for ACP discussion   Loletha Grayer M.D on 12/20/2016 at 9:37 PM  Between 7am to 6pm - Pager - [phone]  After 6pm call admission pager [phone]  [facility]  [phone]  CC: Primary care physician; Keith Rake, MD

## 2016-12-20 NOTE — ED Notes (Signed)
Patient placed on 2L O2 via [place] for pulse ox at 81% on room air while sleeping.

## 2016-12-20 NOTE — Patient Outreach (Addendum)
Received a return phone call from pt to voice message left earlier by RN CM. Following  pt for transition of care/recent hospitalization 4/8-4/16 for acute on chronic heart failure, liver masses suspected metastasis primary lung cancer.   Spoke with pt, HIPAA /identity verified.  Pt reports needs help getting her medications, [employer] calling me ready to have Dr. Manuella Ghazi sign off on her medications in order to send them out but it will take another 2 days.   Pt reports she does not have  3 medications that are on her discharge paper (Lasix, Metoprolol, Potassium), no prescription was given post discharge, needs Dr. Manuella Ghazi to call them into [facility] so she can start taking them.   Patient was recently discharged from hospital and all medications have been reviewed. Pt reports she has not called [name] yet to see if prescriptions have been called in.  Pt reports she is weak since discharge home,lives alone but friends have been checking on her.  Pt reports  has not started weighing yet as has no scale, plan to get one as well as BP machine.  Pt reports no sob, does have some swelling, slept in her recliner last night with feet up.  Friend visiting pt got on the phone, reports pt checked sugar today and it registered high. RN CM requested to have pt recheck her sugar to which pt reports is 495.  RN CM discussed with pt to call Dr. Manuella Ghazi (per pt manages her diabetes) and report Elevated sugar as well as  call [name] to see if prescriptions for Lasix, Potassium, Metoprolol were called in and if not called in to also report this  to Dr. Manuella Ghazi. Pt reports she will call MD.  RN CM discussed with pt [facility] transition of care program- follow for 31 days (weekly phone calls, a home visit)..  Plan:  As discussed, pt to call Dr. Manuella Ghazi - report elevated blood sugar.           As discussed, pt to call [name] to see if prescriptions for Lasix,              Potassium and Metoprolol were called in, if not report to Dr. Manuella Ghazi-     Need to be called in.            As discussed, pt to return phone call to RN CM today- report on               Follow up on call to pharmacy about her medications, call to Dr.               Manuella Ghazi about elevated sugar.           As discussed, plan to follow up again with pt next week telephonically             Part of ongoing transition of care.          Plan to send barrier letter to Dr. Manuella Ghazi informing of [facility] involvement.    Zara Chess.   [place] [facility]  [phone]

## 2016-12-20 NOTE — Patient Outreach (Signed)
Addendum (signed before notes added).    4:11 pm- Follow up call to pt to check on outcome of call to Dr. Manuella Park, reporting elevated sugar, call to [facility] to see if need medications (Lasix, Potassium, Metoprolol) were called in.  Pt reports did call Mackenzie Park and was told needed medications not called in so called Dr. Trena Park office- MD to send prescriptions  both to [facility] and [facility] (mail order), friend to follow up at [facility] for her.  Pt reports she report to MD elevated sugar and MD wanted her to go back to the hospital.  Pt reports she does not want to go back, was there 8 days,  feeling better/ate at 12:30 pm/getting a little more energy.   RN CM requested pt recheck her sugar to which she will have her friend check it, will be back soon- will be staying with her tonight.     Plan:  As discussed, pt to return phone call to RN CM after friend checks her sugar, report finding.     Mackenzie Park.   [place]  [phone]

## 2016-12-20 NOTE — ED Notes (Signed)
CBG:  [id number]

## 2016-12-20 NOTE — Progress Notes (Signed)
Patient ID: Mackenzie Park, female   DOB: [birth year], 71 y.o.   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 Hemoglobin too low for biopsy.  Patient with upper GI bleed and hemoglobin dropped from 11.4 down to 7.5. 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 Protonix drip started.  Patient also may have post obstructive pneumonia  CODE STATUS discussed and patient wishes to be a DO NOT RESUSCITATE.  Patient may die from blood loss anemia.  Her overall prognosis with likely metastatic lung cancer is very poor.  Time spent on ACP discussion 17 minutes  Dr. Loletha Grayer

## 2016-12-20 NOTE — ED Provider Notes (Signed)
[facility] Emergency Department Provider Note  ____________________________________________  Time seen: Approximately 7:21 PM  I have reviewed the triage vital signs and the nursing notes.   HISTORY  Chief Complaint Hyperglycemia   HPI Mackenzie Park is a 71 y.o. female history of recently diagnosed metastatic lung disease, CHF, diabetes, hypertension, hyperlipidemia who presents for evaluation of generalized weakness and hyperglycemia. Patient was discharged from [facility] yesterday after being admitted for CHF exacerbation and found to have metastatic lung cancer. She has not established care with outpatient oncology and has not initiated treatment at this time. Patient reports for the last 3 days she has had progressively worsening shortness of breath, cough productive of clear sputum, and lower extremity pitting edema. She denies fever but has had chills. she has had nausea.No vomiting. Also endorses 3 days of black diarrhea. Patient was on Lovenox shots at [facility]. No blood thinners at home. 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 She denies chest pain, abdominal pain, dysuria or hematuria. Patient reports that she's been extremely weak for the last few days but more severe today. She also reports that her sugars have been elevated since coming home from the hospital yesterday. She is on Januvia only and not on any insulin at home.  Past Medical History:  Diagnosis Date  . Allergic rhinitis   . CHF (congestive heart failure) ([place])   . Depressive disorder   . Diabetes mellitus ([place])   . Hypercholesteremia   . Hypertension   . Lumbosacral neuritis   . Osteoarthritis     Patient Active Problem List   Diagnosis Date Noted  . Abnormal LFTs 12/12/2016  . Acute on chronic diastolic CHF (congestive heart failure) ([place]) 12/12/2016  . CHF exacerbation ([place]) 12/12/2016  . Abnormal liver function   . Hyperglycemia   . Transaminitis   .  Weakness   . 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 Abnormal finding on thyroid function test 02/05/2015  . Breast screening 02/05/2015  . Cervical disc disease 02/05/2015  . Pain in shoulder 02/05/2015  . DDD (degenerative disc disease), lumbar 02/05/2015  . Dizziness 02/05/2015  . Neuropathy 02/05/2015  . Extreme obesity 02/05/2015  . Stasis, venous 02/05/2015  . Obesity 12/08/2014  . Diabetic neuropathy ([place]) 12/08/2014  . Hypertension 12/08/2014  . Hyperlipidemia 12/08/2014  . Infraspinatus tenosynovitis 09/18/2014  . Other synovitis and tenosynovitis, right shoulder 09/18/2014  . Allergic rhinitis, seasonal 01/09/2014  . Clinical depression 11/09/2009  . Osteoarthrosis 12/19/2008  . Diabetes mellitus, type 2 ([place]) 06/18/2007    Past Surgical History:  Procedure Laterality Date  . 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 at [facility].   . REPLACEMENT TOTAL KNEE BILATERAL      Prior to Admission medications   Medication Sig Start Date End Date Taking? Authorizing Provider  aspirin EC 81 MG tablet Take 81 mg by mouth daily.   Yes Historical Provider, MD  fluticasone (FLONASE) 50 MCG/ACT nasal spray Place 1-2 sprays into both nostrils daily as needed for allergies.    Yes Historical Provider, MD  furosemide (LASIX) 20 MG tablet Take 1 tablet (20 mg total) by mouth daily. 12/20/16  Yes Mauricio Gerome Apley, MD  gabapentin (NEURONTIN) 300 MG capsule Take 1 capsule (300 mg total) by mouth at bedtime. 11/08/16  Yes Roselee Nova, MD  lisinopril-hydrochlorothiazide (PRINZIDE,ZESTORETIC) 20-12.5 MG tablet Take 1 tablet by mouth daily. 11/08/16  Yes Roselee Nova, MD  metoprolol tartrate (LOPRESSOR) 25 MG tablet Take 0.5 tablets (12.5 mg total) by mouth 2 (two) times daily. 12/19/16  Yes Mauricio Gerome Apley, MD  potassium chloride (K-DUR) 10 MEQ tablet Take 1 tablet (10 mEq total) by mouth daily. 12/19/16  Yes Mauricio Gerome Apley, MD  sitaGLIPtin  (JANUVIA) 100 MG tablet Take 1 tablet (100 mg total) by mouth daily. 11/16/16  Yes Roselee Nova, MD    Allergies Sulfa antibiotics and Other  Family History  Problem Relation Age of Onset  . Dementia Father   . COPD Sister   . Cancer Sister   . Cancer Mother     Social History Social History  Substance Use Topics  . Smoking status: Former Smoker    Quit date: 08/20/1995  . Smokeless tobacco: Never Used  . Alcohol use No    Review of Systems  Constitutional: Negative for fever. + chills and generalized weakness Eyes: Negative for visual changes. ENT: Negative for sore throat. Neck: No neck pain  Cardiovascular: Negative for chest pain. Respiratory: + shortness of breath and cough Gastrointestinal: Negative for abdominal pain, vomiting. + nausea and melena. Genitourinary: Negative for dysuria. Musculoskeletal: Negative for back pain. Skin: Negative for rash. Neurological: Negative for headaches, weakness or numbness. Psych: No SI or HI  ____________________________________________   PHYSICAL EXAM:  VITAL SIGNS: ED Triage Vitals  Enc Vitals Group     BP 12/20/16 1755 (!) 98/37     Pulse Rate 12/20/16 1755 99     Resp 12/20/16 1755 16     Temp 12/20/16 1755 98 F (36.7 C)     Temp Source 12/20/16 1755 Oral     SpO2 12/20/16 1755 97 %     Weight 12/20/16 1740 221 lb (100.2 kg)     Height 12/20/16 1740 '5\' 6"'$  (1.676 m)     Head Circumference --      Peak Flow --      Pain Score 12/20/16 1739 0     Pain Loc --      Pain Edu? --      Excl. in [place]? --     Constitutional: Alert and oriented, no distress.  HEENT:      Head: Normocephalic and atraumatic.         Eyes: Conjunctivae are normal. Sclera is non-icteric. EOMI. PERRL      Mouth/Throat: Mucous membranes are moist.       Neck: Supple with no signs of meningismus. Cardiovascular: Regular rate and rhythm. No murmurs, gallops, or rubs. 2+ symmetrical distal pulses are present in all extremities. No  JVD. Respiratory: Normal respiratory effort. Lungs are clear to auscultation bilaterally with crackles on the R base.  Gastrointestinal: Soft, non tender, and non distended with positive bowel sounds. No rebound or guarding. Genitourinary: No CVA tenderness.Rectal exam showing melena grossly guaiac positive Musculoskeletal: 2+ pitting edema in bilateral lower extremities Neurologic: Normal speech and language. Face is symmetric. Moving all extremities. No gross focal neurologic deficits are appreciated. Skin: Skin is warm, dry and intact. No rash noted. Psychiatric: Mood and affect are normal. Speech and behavior are normal.  ____________________________________________   LABS (all labs ordered are listed, but only abnormal results are displayed)  Labs Reviewed  BASIC METABOLIC PANEL - Abnormal; Notable for the following:       Result Value   Chloride 93 (*)    Glucose, Bld 448 (*)    BUN 53 (*)    Calcium 8.7 (*)    GFR  calc non Af Amer 58 (*)    All other components within normal limits  CBC - Abnormal; Notable for the following:    WBC 17.0 (*)    RBC 2.71 (*)    Hemoglobin 7.5 (*)    HCT 23.2 (*)    RDW 14.8 (*)    All other components within normal limits  URINALYSIS, COMPLETE (UACMP) WITH MICROSCOPIC - Abnormal; Notable for the following:    Color, Urine YELLOW (*)    APPearance HAZY (*)    Glucose, UA 150 (*)    Leukocytes, UA TRACE (*)    Bacteria, UA RARE (*)    Squamous Epithelial / LPF 0-5 (*)    All other components within normal limits  GLUCOSE, CAPILLARY - Abnormal; Notable for the following:    Glucose-Capillary 406 (*)    All other components within normal limits  GLUCOSE, CAPILLARY - Abnormal; Notable for the following:    Glucose-Capillary 441 (*)    All other components within normal limits  BRAIN NATRIURETIC PEPTIDE  CBG MONITORING, ED   ____________________________________________  EKG  ED ECG REPORT I, Rudene Re, the attending  physician, personally viewed and interpreted this ECG.  Sinus tachycardia, rate of 100, prolonged QTC, normal QRS and PR intervals, normal axis, no ST elevations or depressions. Sinus tachycardia and prolonged QT are new when compared to prior from 8 days ago. ____________________________________________  RADIOLOGY  CXR:  Known right hilar mass is not well characterized. Post-obstructive collapse again noted at the right midlung zone. This could reflect mild pneumonia, depending on the patient's symptoms. ____________________________________________   PROCEDURES  Procedure(s) performed: None Procedures Critical Care performed: yes  CRITICAL CARE Performed by: Rudene Re  ?  Total critical care time: 40 min  Critical care time was exclusive of separately billable procedures and treating other patients.  Critical care was necessary to treat or prevent imminent or life-threatening deterioration.  Critical care was time spent personally by me on the following activities: development of treatment plan with patient and/or surrogate as well as nursing, discussions with consultants, evaluation of patient's response to treatment, examination of patient, obtaining history from patient or surrogate, ordering and performing treatments and interventions, ordering and review of laboratory studies, ordering and review of radiographic studies, pulse oximetry and re-evaluation of patient's condition.  ____________________________________________   INITIAL IMPRESSION / ASSESSMENT AND PLAN / ED COURSE  71 y.o. female history of recently diagnosed metastatic lung disease, CHF, diabetes, hypertension, hyperlipidemia who presents for evaluation of generalized weakness, cough, chill, SOB, melena, and hyperglycemia. Patient with melena on rectal exam. She is not on blood thinners at home however she was on Lovenox shots until yesterday. Her hemoglobin continues to down trend from 8.4 yesterday to  7.5 today. She is hemodynamically stable. 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 She says that these are her wishes. Patient also has worsening leukocytosis with white count of 17 and a chest x-ray concerning for pneumonia which is consistent with clinical picture in the setting of chills, shortness of breath, and cough. Will start patient on antibiotics for hospital-acquired pneumonia including cefepime and vancomycin. Will give gentle hydration and start patient on protonix. Patient with hyperglycemia but no evidence of DKA. We'll give gentle hydration and IV insulin. We'll admit to the hospitalist service.     Pertinent labs & imaging  results that were available during my care of the patient were reviewed by me and considered in my medical decision making (see chart for details).    ____________________________________________   FINAL CLINICAL IMPRESSION(S) / ED DIAGNOSES  Final diagnoses:  UGIB (upper gastrointestinal bleed)  Type 2 diabetes mellitus with hyperglycemia, without long-term current use of insulin (HCC)  HCAP (healthcare-associated pneumonia)      NEW MEDICATIONS STARTED DURING THIS VISIT:  New Prescriptions   No medications on file     Note:  This document was prepared using Dragon voice recognition software and may include unintentional dictation errors.    Rudene Re, MD 12/20/16 2026

## 2016-12-20 NOTE — ED Triage Notes (Signed)
Patient states "My sugars are high".  Patient states just got home from [facility] (admitted for 8 days) last night.  Patient takes Januvia.  Patient states she took one tablet this morning.

## 2016-12-20 NOTE — ED Notes (Signed)
Two unsuccessful IV attempts. Will defer to another RN. 

## 2016-12-20 NOTE — Patient Outreach (Signed)
4:34 pm- received a return phone call from pt as requested by RN CM to recheck sugar.   Spoke with pt, HIPAA/identity verified.  Pt reports glucometer read high to which RN CM reinforced what Dr. Manuella Ghazi said earlier with reported 495 sugar- to go back to the hospital.  RN CM discussed the dangers of having an elevated sugar to which pt did agree to follow up at ED.   Pt reports her niece will be coming soon and she can drive her to the hospital.    Zara Chess.   [place] [facility]  [phone]

## 2016-12-20 NOTE — Patient Outreach (Signed)
Attempt made to contact pt, follow up on referral received 4/13 from [facility] hospital liaison Natividad Brood RN to follow pt for transition of care.  Recent hospitalization 4/8-4/16 for acute on chronic diastolic heart failure, liver masses suspected metastasis primary lung cancer.  History includes but not limited to Hypertension, Hyperlipidemia, Diabetes type 2, Abnormal LFT's.   HIPAA compliant voice message left with contact name and number.   Plan: If no response, plan to follow up again tomorrow as part of ongoing transition of care.    Zara Chess.   [place] [facility]  [phone]

## 2016-12-21 ENCOUNTER — Ambulatory Visit: Payer: Medicare HMO | Admitting: Family Medicine

## 2016-12-21 ENCOUNTER — Ambulatory Visit: Payer: Self-pay | Admitting: *Deleted

## 2016-12-21 DIAGNOSIS — K922 Gastrointestinal hemorrhage, unspecified: Secondary | ICD-10-CM

## 2016-12-21 LAB — BASIC METABOLIC PANEL
ANION GAP: 6 (ref 5–15)
BUN: 45 mg/dL — ABNORMAL HIGH (ref 6–20)
CALCIUM: 8.2 mg/dL — AB (ref 8.9–10.3)
CO2: 33 mmol/L — AB (ref 22–32)
Chloride: 100 mmol/L — ABNORMAL LOW (ref 101–111)
Creatinine, Ser: 0.87 mg/dL (ref 0.44–1.00)
Glucose, Bld: 288 mg/dL — ABNORMAL HIGH (ref 65–99)
Potassium: 3 mmol/L — ABNORMAL LOW (ref 3.5–5.1)
SODIUM: 139 mmol/L (ref 135–145)

## 2016-12-21 LAB — CBC
HCT: 19.3 % — ABNORMAL LOW (ref 35.0–47.0)
HEMOGLOBIN: 6.3 g/dL — AB (ref 12.0–16.0)
MCH: 28 pg (ref 26.0–34.0)
MCHC: 32.7 g/dL (ref 32.0–36.0)
MCV: 85.5 fL (ref 80.0–100.0)
Platelets: 328 10*3/uL (ref 150–440)
RBC: 2.26 MIL/uL — AB (ref 3.80–5.20)
RDW: 14.7 % — ABNORMAL HIGH (ref 11.5–14.5)
WBC: 14.9 10*3/uL — AB (ref 3.6–11.0)

## 2016-12-21 LAB — GLUCOSE, CAPILLARY
GLUCOSE-CAPILLARY: 333 mg/dL — AB (ref 65–99)
Glucose-Capillary: 278 mg/dL — ABNORMAL HIGH (ref 65–99)
Glucose-Capillary: 242 mg/dL — ABNORMAL HIGH (ref 65–99)
Glucose-Capillary: 386 mg/dL — ABNORMAL HIGH (ref 65–99)
Glucose-Capillary: 396 mg/dL — ABNORMAL HIGH (ref 65–99)
Glucose-Capillary: 422 mg/dL — ABNORMAL HIGH (ref 65–99)

## 2016-12-21 LAB — MRSA PCR SCREENING: MRSA by PCR: NEGATIVE

## 2016-12-21 MED ORDER — FUROSEMIDE 20 MG PO TABS
20.0000 mg | ORAL_TABLET | Freq: Every day | ORAL | Status: DC
  Administered 2016-12-21 – 2016-12-23 (×3): 20 mg via ORAL
  Filled 2016-12-21 (×3): qty 1

## 2016-12-21 MED ORDER — SUCRALFATE 1 GM/10ML PO SUSP
1.0000 g | Freq: Three times a day (TID) | ORAL | Status: DC
  Administered 2016-12-21 – 2016-12-23 (×9): 1 g via ORAL
  Filled 2016-12-21 (×9): qty 10

## 2016-12-21 MED ORDER — SODIUM CHLORIDE 0.9 % IV SOLN
400.0000 mg | Freq: Once | INTRAVENOUS | Status: AC
  Administered 2016-12-21: 400 mg via INTRAVENOUS
  Filled 2016-12-21: qty 20

## 2016-12-21 MED ORDER — LEVOFLOXACIN 250 MG PO TABS
500.0000 mg | ORAL_TABLET | Freq: Every day | ORAL | Status: DC
  Administered 2016-12-21 – 2016-12-23 (×3): 500 mg via ORAL
  Filled 2016-12-21 (×3): qty 2

## 2016-12-21 MED ORDER — INSULIN GLARGINE 100 UNIT/ML ~~LOC~~ SOLN
15.0000 [IU] | Freq: Every day | SUBCUTANEOUS | Status: DC
  Administered 2016-12-21 – 2016-12-22 (×2): 15 [IU] via SUBCUTANEOUS
  Filled 2016-12-21 (×3): qty 0.15

## 2016-12-21 NOTE — Consult Note (Signed)
   [facility] CM Inpatient Consult   12/21/2016  ISLAY POLANCO [birth year] [id number]   Patient is currently active with [facility] for chronic disease management services.  Patient has been engaged by a [employer].  Our community based plan of care has focused on disease management and community resource support.  Patient will receive a post discharge transition of care call and will be evaluated for monthly home visits for assessments and disease process education.  Of note, [facility] Care Management services does not replace or interfere with any services that are needed or arranged by inpatient case management or social work.  For additional questions or referrals please contact:  Lakynn Halvorsen RN, [facility] Liaison  [phone]) [place] [phone]) Toll free office

## 2016-12-21 NOTE — Consult Note (Signed)
[facility] NOTE  Patient Care Team: Roselee Nova, MD as PCP - General (Family Medicine) Lyman Speller, RN as [facility]  CHIEF COMPLAINTS/PURPOSE OF CONSULTATION:  Multiple liver lesions  HISTORY OF PRESENTING ILLNESS:  [place] 71 y.o.  female very pleasant with a remote history of smoking was recently admitted to [facility] for congestive heart failure- and during the workup noted to have  Multiple liver lesions/ right middle lobe lesion with mediastinal adenopathy on CT scan. Patient was recommended outpatient.  However patient is currently admit worsening shortness of breath/noted to have severe anemia hemoglobin-6.3/ and prior hemoglobin was 11. Patient complaint of melena. She denies any significant abdominal pain.  Patient does complain of nausea. Complains of poor appetite. Positive for weight loss.   ROS: A complete 10 point review of system is done which is negative except mentioned above in history of present illness  MEDICAL HISTORY:  Past Medical History:  Diagnosis Date  . Allergic rhinitis   . CHF (congestive heart failure) ([place])   . Depressive disorder   . Diabetes mellitus ([place])   . Hypercholesteremia   . Hypertension   . Lumbosacral neuritis   . Osteoarthritis     SURGICAL HISTORY: Past Surgical History:  Procedure Laterality Date  . 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 at [facility].   . CHOLECYSTECTOMY    . REPLACEMENT TOTAL KNEE BILATERAL    . TUBAL LIGATION      SOCIAL HISTORY: Social History   Social History  . Marital status: Single    Spouse name: N/A  . Number of children: N/A  . Years of education: N/A   Occupational History  . Not on file.   Social History Main Topics  . Smoking status: Former Smoker    Quit date: 08/20/1995  . Smokeless tobacco: Never Used  . Alcohol use No  . Drug use: No  . Sexual activity: Not on file   Other Topics Concern  . 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   She is single. She has no children. She has a support system specifically with her niece.    FAMILY HISTORY: Family History  Problem Relation Age of Onset  . Dementia Father   . COPD Sister   . Breast cancer Sister   . Lung cancer Sister   . Lung cancer Mother   . Heart failure Brother   . Kidney failure Brother     ALLERGIES:  is allergic to sulfa antibiotics and other.  MEDICATIONS:  Current Facility-Administered Medications  Medication Dose Route Frequency Provider Last Rate Last Dose  . fluticasone (FLONASE) 50 MCG/ACT nasal spray 1 spray  1 spray Each Nare Daily Loletha Grayer, MD      . furosemide (LASIX) tablet 20 mg  20 mg Oral Daily Fritzi Mandes, MD   20 mg at 12/21/16 1046  . gabapentin (NEURONTIN) capsule 300 mg  300 mg Oral QHS Loletha Grayer, MD   300 mg at 12/21/16 0038  . insulin aspart (novoLOG) injection 0-5 Units  0-5 Units Subcutaneous QHS Loletha Grayer, MD   4 Units at 12/21/16 0039  . insulin aspart (novoLOG) injection 0-9 Units  0-9 Units Subcutaneous TID WC Loletha Grayer, MD   9 Units at 12/21/16 1739  . insulin glargine (LANTUS) injection 15 Units  15 Units Subcutaneous QHS Fritzi Mandes, MD      . levofloxacin [facility]) tablet 500 mg  500 mg Oral Daily Fritzi Mandes, MD   500 mg at 12/21/16 1542  . pantoprazole (PROTONIX) 80 mg in sodium chloride 0.9 % 250 mL (0.32 mg/mL) infusion  8 mg/hr Intravenous Continuous Rudene Re, MD 25 mL/hr at 12/21/16 1914 8 mg/hr at 12/21/16 1914  . [START ON 12/24/2016] pantoprazole (PROTONIX) injection 40 mg  40 mg Intravenous Q12H Rudene Re, MD      . sucralfate (CARAFATE) 1 GM/10ML suspension 1 g  1 g Oral TID WC & HS Jonathon Bellows, MD   1 g at 12/21/16 1740      .  PHYSICAL EXAMINATION:  Vitals:   12/21/16 1052 12/21/16 1221  BP: (!) 104/42 (!) 102/36  Pulse:  (!) 103  Resp:  20   Temp:  98.4 F (36.9 C)   Filed Weights   12/20/16 1740 12/21/16 0002  Weight: 221 lb (100.2 kg) 227 lb 3.2 oz (103.1 kg)    GENERAL: Well-nourished well-developed; Alert, no distress and comfortable.   Accompanied by 2 sisters. EYES: positive for pallor no icterus.  OROPHARYNX: no thrush or ulceration. NECK: supple, no masses felt LYMPH:  no palpable lymphadenopathy in the cervical, axillary or inguinal regions LUNGS: decreased breath sounds to auscultation at bases and  No wheeze or crackles HEART/CVS: regular rate & rhythm and no murmurs; No lower extremity edema ABDOMEN: abdomen soft, non-tender and normal bowel sounds Musculoskeletal:no cyanosis of digits and no clubbing  PSYCH: alert & oriented x 3 with fluent speech NEURO: no focal motor/sensory deficits SKIN:  no rashes or significant lesions  LABORATORY DATA:  I have reviewed the data as listed Lab Results  Component Value Date   WBC 14.9 (H) 12/21/2016   HGB 6.3 (L) 12/21/2016   HCT 19.3 (L) 12/21/2016   MCV 85.5 12/21/2016   PLT 328 12/21/2016    Recent Labs  12/12/16 0021 12/13/16 0700 12/14/16 0321  12/15/16 0246  12/18/16 0508 12/20/16 1741 12/21/16 0502  NA  --  135 137  --  138  < > 138 135 139  K  --  2.9* 2.6*  < > 3.2*  < > 4.4 4.0 3.0*  CL  --  89* 89*  --  94*  < > 95* 93* 100*  CO2  --  33* 38*  --  35*  < > 32 31 33*  GLUCOSE  --  379* 299*  --  159*  < > 181* 448* 288*  BUN  --  16 25*  --  30*  < > 46* 53* 45*  CREATININE  --  0.86 0.75  --  0.81  < > 0.88 0.98 0.87  CALCIUM  --  9.0 8.8*  --  8.8*  < > 9.1 8.7* 8.2*  GFRNONAA  --  >60 >60  --  >60  < > >60 58* >60  GFRAA  --  >60 >60  --  >60  < > >60 >60 >60  PROT 6.4* 6.5 5.4*  --  5.3*  --   --   --   --   ALBUMIN 3.1* 3.3* 2.8*  --  2.9*  --   --   --   --   AST 92* 132* 112*  --  142*  --   --   --   --   ALT 203* 238* 228*  --  237*  --   --   --   --  ALKPHOS 94 100 89  --  89  --   --   --   --   BILITOT 1.4* 1.0 0.6  --   0.8  --   --   --   --   BILIDIR 0.3  --   --   --   --   --   --   --   --   IBILI 1.1*  --   --   --   --   --   --   --   --   < > = values in this interval not displayed.  RADIOGRAPHIC STUDIES: I have personally reviewed the radiological images as listed and agreed with the findings in the report. Dg Chest 2 View  Result Date: 12/20/2016 CLINICAL DATA:  Acute onset of hyperglycemia and leukocytosis. Initial encounter. EXAM: CHEST  2 VIEW COMPARISON:  Chest radiograph performed 09/13/2016, and CT of the chest performed 12/15/2016 FINDINGS: The known right hilar mass is not well characterized, with post-obstructive collapse again noted at the right midlung zone. No pleural effusion or pneumothorax is seen. The heart is normal in size. No acute osseous abnormalities are identified. IMPRESSION: Known right hilar mass is not well characterized. Post-obstructive collapse again noted at the right midlung zone. This could reflect mild pneumonia, depending on the patient's symptoms. Electronically Signed   By: Garald Balding M.D.   On: 12/20/2016 19:02   Dg Chest 2 View  Result Date: 12/09/2016 CLINICAL DATA:  Shortness of breath, nausea and dry cough since October, 2017. EXAM: CHEST  2 VIEW COMPARISON:  Single-view of the chest 05/11/2015. FINDINGS: Small focus of airspace disease is seen in the anterior right upper lobe. The left lung is clear. Heart size is normal. No pneumothorax or pleural effusion. Aortic atherosclerosis is noted. No acute abnormality. IMPRESSION: Airspace disease in the anterior right upper lobe has an appearance most compatible with atelectasis but could be due to pneumonia. Atherosclerosis. Electronically Signed   By: Inge Rise M.D.   On: 12/09/2016 08:51   Ct Chest W Contrast  Result Date: 12/16/2016 CLINICAL DATA:  Liver mass on ultrasound and MRI. Elevated liver enzymes. EXAM: CT ABDOMEN AND PELVIS WITH CONTRAST TECHNIQUE: Multidetector CT imaging of the abdomen and  pelvis was performed using the standard protocol following bolus administration of intravenous contrast. CONTRAST:  23m ISOVUE-300 IOPAMIDOL (ISOVUE-300) INJECTION 61% COMPARISON:  MRI 12/14/2016 FINDINGS: CT CHEST FINDINGS Cardiovascular: No significant vascular findings. Normal heart size. No pericardial effusion. Mediastinum/Nodes: No axillary supraclavicular adenopathy. Small 15 mm nodule in the LEFT lobe of thyroid gland. Enlarged RIGHT lower paratracheal lymph nodes which extend into the RIGHT hilum. RIGHT lower paratracheal lymph node measures 18 mm short axis. RIGHT hilar lymph node measures 16 mm. There is postobstructive atelectasis of the RIGHT middle lobe associated with this hilar adenopathy/mass. Lungs/Pleura: RIGHT middle lobe atelectasis. Linear nodular thickening adjacent to the atelectasis measuring 21 by 19 mm (image 55, series 8). Small peripheral nodule measuring 8 mm on image 57, series 5. Musculoskeletal: No aggressive osseous lesion. CT ABDOMEN AND PELVIS FINDINGS Hepatobiliary: Multiple low-attenuation lesions with peripheral enhancement within LEFT and RIGHT hepatic lobes as described on comparison MRI. Central abdominal lesion measures 53 mm (image 44, series 3). Large RIGHT hepatic lobe lesion measures 84 mm x 55 mm. LEFT lateral hepatic lobe lesion measures 27 by 27 mm image 55, series 3. There are approximately 30 lesions within liver. No biliary duct dilatation. Postcholecystectomy. Common bile duct is dilated  to 12 mm presumably related to cholecystectomy. Pancreas: Pancreas is normal. No ductal dilatation. No pancreatic inflammation. Spleen: Normal spleen Adrenals/urinary tract: RIGHT adrenal gland is enlarged to 22 by 31 mm and enhances. Kidneys ureters normal. Bladder normal. Stomach/Bowel: Post gastric bypass anatomy. No bowel obstruction. Appendix normal. Several diverticula of the colon without acute inflammation. Vascular/Lymphatic: Abdominal aorta is normal caliber. There is  no retroperitoneal or periportal lymphadenopathy. No pelvic lymphadenopathy. Reproductive: Uterus and ovaries normal Other: No free fluid. Musculoskeletal: No aggressive osseous lesion. IMPRESSION: Chest Impression: 1. RIGHT hilar adenopathy / mass with postobstructive collapse of the RIGHT middle lobe is suggestive of SMALL CELL LUNG CANCER. 2. Nodular lesion in the RIGHT middle lobe adjacent to the atelectasis may represent primary malignancy. 3. Enlarged RIGHT paratracheal metastatic adenopathy. Abdomen / Pelvis Impression: 1. Multiple hepatic metastasis as described on comparison MRI. 2. RIGHT adrenal gland metastasis. 3. No metastatic adenopathy in the abdomen pelvis. 4. Postsurgical change consistent with cholecystectomy and bariatric surgery Electronically Signed   By: Suzy Bouchard M.D.   On: 12/16/2016 10:24   Ct Abdomen Pelvis W Contrast  Result Date: 12/16/2016 CLINICAL DATA:  Liver mass on ultrasound and MRI. Elevated liver enzymes. EXAM: CT ABDOMEN AND PELVIS WITH CONTRAST TECHNIQUE: Multidetector CT imaging of the abdomen and pelvis was performed using the standard protocol following bolus administration of intravenous contrast. CONTRAST:  48m ISOVUE-300 IOPAMIDOL (ISOVUE-300) INJECTION 61% COMPARISON:  MRI 12/14/2016 FINDINGS: CT CHEST FINDINGS Cardiovascular: No significant vascular findings. Normal heart size. No pericardial effusion. Mediastinum/Nodes: No axillary supraclavicular adenopathy. Small 15 mm nodule in the LEFT lobe of thyroid gland. Enlarged RIGHT lower paratracheal lymph nodes which extend into the RIGHT hilum. RIGHT lower paratracheal lymph node measures 18 mm short axis. RIGHT hilar lymph node measures 16 mm. There is postobstructive atelectasis of the RIGHT middle lobe associated with this hilar adenopathy/mass. Lungs/Pleura: RIGHT middle lobe atelectasis. Linear nodular thickening adjacent to the atelectasis measuring 21 by 19 mm (image 55, series 8). Small peripheral  nodule measuring 8 mm on image 57, series 5. Musculoskeletal: No aggressive osseous lesion. CT ABDOMEN AND PELVIS FINDINGS Hepatobiliary: Multiple low-attenuation lesions with peripheral enhancement within LEFT and RIGHT hepatic lobes as described on comparison MRI. Central abdominal lesion measures 53 mm (image 44, series 3). Large RIGHT hepatic lobe lesion measures 84 mm x 55 mm. LEFT lateral hepatic lobe lesion measures 27 by 27 mm image 55, series 3. There are approximately 30 lesions within liver. No biliary duct dilatation. Postcholecystectomy. Common bile duct is dilated to 12 mm presumably related to cholecystectomy. Pancreas: Pancreas is normal. No ductal dilatation. No pancreatic inflammation. Spleen: Normal spleen Adrenals/urinary tract: RIGHT adrenal gland is enlarged to 22 by 31 mm and enhances. Kidneys ureters normal. Bladder normal. Stomach/Bowel: Post gastric bypass anatomy. No bowel obstruction. Appendix normal. Several diverticula of the colon without acute inflammation. Vascular/Lymphatic: Abdominal aorta is normal caliber. There is no retroperitoneal or periportal lymphadenopathy. No pelvic lymphadenopathy. Reproductive: Uterus and ovaries normal Other: No free fluid. Musculoskeletal: No aggressive osseous lesion. IMPRESSION: Chest Impression: 1. RIGHT hilar adenopathy / mass with postobstructive collapse of the RIGHT middle lobe is suggestive of SMALL CELL LUNG CANCER. 2. Nodular lesion in the RIGHT middle lobe adjacent to the atelectasis may represent primary malignancy. 3. Enlarged RIGHT paratracheal metastatic adenopathy. Abdomen / Pelvis Impression: 1. Multiple hepatic metastasis as described on comparison MRI. 2. RIGHT adrenal gland metastasis. 3. No metastatic adenopathy in the abdomen pelvis. 4. Postsurgical change consistent with cholecystectomy and bariatric  surgery Electronically Signed   By: Suzy Bouchard M.D.   On: 12/16/2016 10:24   Mr Liver W WI Contrast  Result Date:  12/14/2016 CLINICAL DATA:  Elevated liver function tests. Liver masses seen on recent ultrasound. EXAM: MRI ABDOMEN WITHOUT AND WITH CONTRAST TECHNIQUE: Multiplanar multisequence MR imaging of the abdomen was performed both before and after the administration of intravenous contrast. CONTRAST:  30m MULTIHANCE GADOBENATE DIMEGLUMINE 529 MG/ML IV SOLN COMPARISON:  Ultrasound on 12/12/2016 FINDINGS: Lower chest: No acute findings. Hepatobiliary: Innumerable hypovascular metastases are seen throughout the right and left hepatic lobes. Majority of the larger metastases show central necrosis. Dominant metastatic lesion in the right hepatic lobe measures 9.9 x 7.1 cm on image 58/1101. Prior cholecystectomy noted. No evidence of biliary dilatation. Pancreas:  No mass or inflammatory changes. Spleen: A 2.5 x 3.1 cm homogeneous right adrenal mass is seen which has nonspecific characteristics. This mass does show restricted diffusion, which favors an adrenal metastasis over adrenal adenoma. Left adrenal gland is normal in appearance. No renal masses are identified.  No evidence of hydronephrosis. Adrenals/Urinary Tract: No masses identified. No evidence of hydronephrosis. Stomach/Bowel: Visualized portions within the abdomen are unremarkable. Vascular/Lymphatic: No pathologically enlarged lymph nodes identified. No abdominal aortic aneurysm. Other:  None. Musculoskeletal:  No suspicious bone lesions identified. IMPRESSION: Diffuse hypovascular liver metastases. 3 cm right adrenal mass, suspicious for metastasis although lipid-poor adrenal adenoma is a consideration. Electronically Signed   By: JEarle GellM.D.   On: 12/14/2016 18:36   Dg Abdomen Acute W/chest  Result Date: 12/12/2016 CLINICAL DATA:  Cough, upper abdominal pain EXAM: DG ABDOMEN ACUTE W/ 1V CHEST COMPARISON:  12/09/2016 and 05/11/2015 CXR FINDINGS: There is no evidence of dilated bowel loops or free intraperitoneal air. Surgical clips are seen in the upper  abdomen bilaterally in the right upper quadrant and epigastric region as well as left lower quadrant. No radiopaque calculi. Lumbar degenerative changes are seen. Mild joint space narrowing of both hips. No acute osseous appearing abnormality. Heart size and mediastinal contours are within normal limits. Ovoid density along the minor fissure is noted which may reflect a pseudo lesion with trace fluid or atelectasis along side or within the fissure. IMPRESSION: Negative abdominal radiographs. No acute cardiopulmonary disease. Fluid or atelectasis along side the minor fissure as on recent comparison study. Electronically Signed   By: DAshley RoyaltyM.D.   On: 12/12/2016 00:39   [place]Abdomen Limited Ruq  Result Date: 12/12/2016 CLINICAL DATA:  Acute onset of abnormal LFTs.  Initial encounter. EXAM: [place]DOM[place] LIMITED - RIGHT UPPER QUADRANT COMPARISON:  None. FINDINGS: Gallbladder: Status post cholecystectomy.  No retained stones seen. Common bile duct: Diameter: 1.2 cm, likely within normal limits status post cholecystectomy. Liver: Multiple masses are noted within the liver, diffusely heterogeneous in appearance. There is a complex 5.8 x 5.6 x 5.5 cm mass at the right hepatic lobe, a 6.0 x 4.7 x 3.5 cm mass also at the right hepatic lobe, and a 4.7 x 4.5 x 3.2 cm mass at the left hepatic lobe. There is diffuse heterogeneity of hepatic echogenicity. IMPRESSION: Multiple heterogeneous hepatic masses noted, measuring up to 6 cm in size. These are difficult to fully characterize on ultrasound. Dynamic liver protocol MRI or CT is recommended for further evaluation. Electronically Signed   By: JGarald BaldingM.D.   On: 12/12/2016 04:55    ASSESSMENT & PLAN:   # 71year old female patient currently admitted to the hospital for severe anemia/melena- with suspected  malignancy based on liver lesions/lung mass.   # Multiple liver lesions/ right middle lobe collapse/ ~ mass measuring about 2 cm- highly suspicious for  malignancy. Discussed with the patient regarding the importance of biopsy- to confirm malignancy; also to characterize the type of malignancy. Discussed with the patient that given the likelihood of malignancy/this seems to be advanced; and cannot be cured. However, overall prognosis will depend upon the type of malignancy. However biopsy is on hold at this time given her severe anemia [C discussion below]  # acute severe anemia hemoglobin 6.3 [J[employer]ovah's Witness-declines blood products]- the context of melena highly suspicious for upper GI bleed. Patient being evaluated by GI. For now recommend conservative measures- given the risk of upper endoscopy. Patient is currently receiving IV iron. Patient declines PRBC transfusion. Patient is okay with Aranesp if needed.  # Elevated LFTs likely secondary to malignancy involving the liver. Repeat LFTs; check LDH.  Thank you Dr. Posey Pronto for allowing me to participate in the care of your pleasant patient. Please do not hesitate to contact me with questions or concerns in the interim.  All questions were answered. The patient knows to call the clinic with any problems, questions or concerns.       Cammie Sickle, MD 12/21/2016 7:43 PM

## 2016-12-21 NOTE — Progress Notes (Signed)
Initial Nutrition Assessment  DOCUMENTATION CODES:   Obesity unspecified  INTERVENTION:  Provide Glucerna Shake po BID with diet advancement, each supplement provides 220 kcal and 10 grams of protein. Patient prefers butter pecan flavor.  Recommend multivitamin with minerals daily. Patient would also benefit from taking 1500 mg calcium, and sublingual vitamin B12 daily due to history of Roux-en-Y Gastric Bypass.   Provided "Easy to Chew and Swallow Foods" and "Taste and Smell Changes" handouts from the Academy of Nutrition and Dietetics. Encouraged intake of soft or pureed foods from each food group due to patient's reported difficulty swallowing. Reviewed strategies to overcome metallic taste with foods.  NUTRITION DIAGNOSIS:   Inadequate oral intake related to poor appetite, other (see comment) (taste changes, difficulty swallowing) as evidenced by per patient/family report.  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 Recently discharged from [facility] where she was found to have lung mass and liver lesions.   -Unable to receive blood transfusion as patient is a Sales promotion account executive Witness. Per chart patient will need a higher hemoglobin prior to being able to get biopsy. -Per GI patient's low Hgb, low blood pressure, possible PNA too high risk for sedation/endoscopy at this time.  Spoke with patient at bedside. She reports has had a poor appetite and has not been eating well for a "long time." She has a metallic taste when she eats or drinks and also feels "grit" when she swallows making it difficult to swallow. She reports tolerating soft and  pureed foods best. Patient attempts to eat 3 meals per day (small amounts of meat with soft vegetables) but is not able to finish all of her meals. Unable to provide further details on recent intake. Endorses black tarry stool/diarrhea while at [facility] recently but reports it has improved now. Denies any N/V or abdominal pain. Patient reports she had a Roux-en-Y gastric bypass at [facility]. She had reported to me it was 2010 but per chart it was 2004. She does not follow up yearly any longer. She reports only taking one calcium with vitamin D supplement daily (likely only 500-600 mg calcium). She no longer takes multivitamin or B12. She reports she enjoys butter pecan Glucerna. Premier Protein caused indigestion/reflux.  Patient reports she used to weigh 330 lbs prior to weight loss surgery many years ago. Lately UBW 245 lbs. Started losing weight in December but did not realize it at first. Patient reports weight loss of 17.8 lbs (7.3% body weight) over 4.5 months, which is not significant for time frame. If weight on 11/08/2016 is accurate and weight was only over 1 month, then it would be significant for time frame.  Medications reviewed and include: Lasix 20 mg daily, Novolog sliding scale TID with meals and daily at bedtime, Lantus 15 units daily at bedtime, pantoprazole, Carafate.   Labs reviewed: CBG 242-441 past 24 hrs, Potassium 3, Chloride 100, CO2 33, BUN 45, Hgb 6.3.  Nutrition-Focused physical exam completed. Findings are no fat depletion, no muscle depletion, and mild edema.   Patient is at risk for acute malnutrition due to recent weight loss (unclear if it is significant for  time frame) and poor PO intake.  Discussed with RN.  Diet Order:  Diet clear liquid Room service appropriate? Yes; Fluid consistency: Thin  Skin:  Reviewed, no issues  Last BM:  12/20/2016  Height:   Ht Readings from Last 1 Encounters:  12/21/16 '5\' 6"'$  (1.676 m)    Weight:   Wt Readings from Last 1  Encounters:  12/21/16 227 lb 3.2 oz (103.1 kg)    Ideal Body Weight:  59.1 kg  BMI:  Body mass index is 36.67 kg/m.  Estimated Nutritional Needs:   Kcal:  1890-2210 (MSJ x 1.2-1.4)  Protein:  100-120 grams (1-1.2 grams/kg)  Fluid:  1.8-2.2 L/day  EDUCATION NEEDS:   Education needs addressed  Willey Blade, MS, RD, LDN Pager: [phone] After Hours Pager: [phone]

## 2016-12-21 NOTE — Progress Notes (Signed)
Nurse paged [facility] to inform that a Pt in [address] wanted to complete an AD. [place] met Pt. Pt's family member was bedside. Pt had a Jehovah Witness Power of Forensic scientist. [place] verified it, gathered 2 witnesses and a Insurance account manager, and had Pt complete it. [facility] gave original copy to Pt and a copy placed in Pt's file.    12/21/16 1600  Clinical Encounter Type  Visited With Patient;Patient and family together  Visit Type Initial;Other (Comment)  Referral From Nurse  Consult/Referral To Chaplain  Spiritual Encounters  Spiritual Needs Literature  Stress Factors  Patient Stress Factors Health changes  Family Stress Factors Health changes  Advance Directives (For Healthcare)  Does Patient Have a Medical Advance Directive? Yes  Does patient want to make changes to medical advance directive? Yes (Inpatient - patient requests chaplain consult to change a medical advance directive)  Type of Advance Directive [place] in Chart? Yes  Would patient like information on creating a medical advance directive? Yes (Inpatient - patient requests chaplain consult to create a medical advance directive)  [place] Directives  Does Patient Have a Mental Health Advance Directive? No  Would patient like information on creating a mental health advance directive? No - Patient declined

## 2016-12-21 NOTE — Progress Notes (Addendum)
[facility] [place] NAME: Mackenzie Park    MR#:  [id number]  DATE OF BIRTH:  Jun 11, 1946  SUBJECTIVE:   Came in with SOB and melena Pt is Jehovah's witness REVIEW OF SYSTEMS:   Review of Systems  Constitutional: Negative for chills, fever and weight loss.  HENT: Negative for ear discharge, ear pain and nosebleeds.   Eyes: Negative for blurred vision, pain and discharge.  Respiratory: Negative for sputum production, shortness of breath, wheezing and stridor.   Cardiovascular: Negative for chest pain, palpitations, orthopnea and PND.  Gastrointestinal: Positive for melena. Negative for abdominal pain, diarrhea, nausea and vomiting.  Genitourinary: Negative for frequency and urgency.  Musculoskeletal: Negative for back pain and joint pain.  Neurological: Positive for weakness. Negative for sensory change, speech change and focal weakness.  Psychiatric/Behavioral: Negative for depression and hallucinations. The patient is not nervous/anxious.    Tolerating Diet:yes Tolerating PT: pending  DRUG ALLERGIES:   Allergies  Allergen Reactions  . Sulfa Antibiotics Anaphylaxis and Hives  . Other Nausea And Vomiting    Milk products    VITALS:  Blood pressure (!) 102/36, pulse (!) 103, temperature 98.4 F (36.9 C), temperature source Oral, resp. rate 20, height '5\' 6"'$  (1.676 m), weight 103.1 kg (227 lb 3.2 oz), SpO2 100 %.  PHYSICAL EXAMINATION:   Physical Exam  GENERAL:  71 y.o.-year-old patient lying in the bed with no acute distress. obese EYES: Pupils equal, round, reactive to light and accommodation. No scleral icterus. Extraocular muscles intact. Pallor++ HEENT: Head atraumatic, normocephalic. Oropharynx and nasopharynx clear.  NECK:  Supple, no jugular venous distention. No thyroid enlargement, no tenderness.  LUNGS: Normal breath sounds bilaterally, no wheezing, rales, rhonchi. No use of accessory muscles of respiration.   CARDIOVASCULAR: S1, S2 normal. No murmurs, rubs, or gallops.  ABDOMEN: Soft, nontender, nondistended. Bowel sounds present. No organomegaly or mass.  EXTREMITIES: No cyanosis, clubbing or edema b/l.    NEUROLOGIC: Cranial nerves II through XII are intact. No focal Motor or sensory deficits b/l.   PSYCHIATRIC:  patient is alert and oriented x 3.  SKIN: No obvious rash, lesion, or ulcer.   LABORATORY PANEL:  CBC  Recent Labs Lab 12/21/16 0502  WBC 14.9*  HGB 6.3*  HCT 19.3*  PLT 328    Chemistries   Recent Labs Lab 12/15/16 0246  12/21/16 0502  NA 138  < > 139  K 3.2*  < > 3.0*  CL 94*  < > 100*  CO2 35*  < > 33*  GLUCOSE 159*  < > 288*  BUN 30*  < > 45*  CREATININE 0.81  < > 0.87  CALCIUM 8.8*  < > 8.2*  MG 2.1  --   --   AST 142*  --   --   ALT 237*  --   --   ALKPHOS 89  --   --   BILITOT 0.8  --   --   < > = values in this interval not displayed. Cardiac Enzymes No results for input(s): TROPONINI in the last 168 hours. RADIOLOGY:  Dg Chest 2 View  Result Date: 12/20/2016 CLINICAL DATA:  Acute onset of hyperglycemia and leukocytosis. Initial encounter. EXAM: CHEST  2 VIEW COMPARISON:  Chest radiograph performed 09/13/2016, and CT of the chest performed 12/15/2016 FINDINGS: The known right hilar mass is not well characterized, with post-obstructive collapse again noted at the right midlung zone. No pleural effusion or pneumothorax is seen.  The heart is normal in size. No acute osseous abnormalities are identified. IMPRESSION: Known right hilar mass is not well characterized. Post-obstructive collapse again noted at the right midlung zone. This could reflect mild pneumonia, depending on the patient's symptoms. Electronically Signed   By: Garald Balding M.D.   On: 12/20/2016 19:02   ASSESSMENT AND PLAN:  Mackenzie Park  is a 71 y.o. female stated she can't get her sugars down. She was recently discharged from [facility]. She was told that she has a lung mass and  some liver lesions. She was therefore congestive heart failure. She's not been doing well at home. She is having shortness of breath especially when lying flat. She states that she's having black tarry stools for the past 6 days  1. Upper GI bleed with melena appears Acute blood loss anemia. - Hemoglobin dropped from 11.4 down to 7.5---6.3. Patient is a Restaurant manager, fast food. - IV Protonix drip started. Aspirin stopped. - Clear liquid diet tonight - Case discussed with Dr. Vicente Males gastroenterology ---high risk for any procedure given low hgb counts.  -received IV iron yday  2. Lung mass and liver masses and adrenal mass. Likely metastatic lung cancer.  -Oncology consultation. Patient is a DO NOT RESUSCITATE. Will need better hemoglobin prior to biopsy.  3. Acute on chronic diastolic congestive heart failure. -recieved IV Lasix 40 mg IV twice a day. Blood pressure too low for other medications at this point. -change to oral meds  4. Relative hypotension. Likely with blood loss. we would rather not give IV fluids of vital have to to further dilute the hemoglobin. Unable to give blood. Hold antihypertensive medications except for Lasix at this point  5. Type 2 diabetes mellitus uncontrolled sugars. Start sliding scale and low dose Lantus.  6. Palliative care consultation for goals of care  7. Weakness physical therapy evaluation  Case discussed with Care Management/Social Worker. Management plans discussed with the patient, family and they are in agreement.  CODE STATUS: DNR  DVT Prophylaxis: SCD  TOTAL TIME TAKING CARE OF THIS PATIENT: 30 minutes.  >50% time spent on counselling and coordination of care Dr Vicente Males and Dr Rogue Bussing  POSSIBLE D/C IN  1-2DAYS, DEPENDING ON CLINICAL CONDITION.  Note: This dictation was prepared with Dragon dictation along with smaller phrase technology. Any transcriptional errors that result from this process are unintentional.  Mackenzie Park M.D on 12/21/2016 at  5:41 PM  Between 7am to 6pm - Pager - [phone]  After 6pm go to [email or web address] - password [email or web address] [facility]  Office  [phone]  CC: Primary care physician; Keith Rake, MD

## 2016-12-21 NOTE — Progress Notes (Signed)
Notified Dr. Posey Pronto of decrease in BP last night and pt has an order for IV lasix 40. Per MD change order to '20mg'$  daily as this is pt home dose.

## 2016-12-21 NOTE — Progress Notes (Signed)
Per Dr. Posey Pronto okay to place pt on clear liquid diet.

## 2016-12-21 NOTE — Consult Note (Signed)
Jonathon Bellows MD  [address] [place], [place] [address] Phone: [phone] Fax : [phone]  Consultation  Referring Provider:    Dr Posey Pronto  Primary Care Physician:  Keith Rake, MD Primary Gastroenterologist:  None  Reason for Consultation:     GI bleed   Date of Admission:  12/20/2016 Date of Consultation:  12/21/2016         HPI:   Mackenzie Park is a 71 y.o. female presented to the ER yesterday with high sugars. She also mentioned that she was having tarry black stools last 7 days , She is a Arts development officer witness and does not receive a blood transfusion. On admission was noted that her Hb had dropped from 11.4 grams on 12/14/16 to 7.5 grams .   On admission noted to have lung mass, liver masses and an adrenal mass , she is being evaluated for metastatic neoplasm probably of lung origin. Palliative care has been consulted.   She says that while she was recently admitted at [facility] health noted tarry black stools for about 4 days duration which she did not bring to anyone's attention. She says that since her admission she has not had any further bowel movements. Denies use of blood thinners. She has had a Roux en y gastric bypass.    Past Medical History:  Diagnosis Date  . Allergic rhinitis   . CHF (congestive heart failure) ([place])   . Depressive disorder   . Diabetes mellitus ([place])   . Hypercholesteremia   . Hypertension   . Lumbosacral neuritis   . Osteoarthritis     Past Surgical History:  Procedure Laterality Date  . 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 at [facility].   . CHOLECYSTECTOMY    . REPLACEMENT TOTAL KNEE BILATERAL    . TUBAL LIGATION      Prior to Admission medications   Medication Sig Start Date End Date Taking? Authorizing Provider  aspirin EC 81 MG tablet Take 81 mg by mouth daily.   Yes Historical Provider, MD  fluticasone (FLONASE) 50 MCG/ACT nasal spray Place 1-2 sprays into both nostrils daily as needed for allergies.    Yes Historical Provider,  MD  furosemide (LASIX) 20 MG tablet Take 1 tablet (20 mg total) by mouth daily. 12/20/16  Yes Mauricio Gerome Apley, MD  gabapentin (NEURONTIN) 300 MG capsule Take 1 capsule (300 mg total) by mouth at bedtime. 11/08/16  Yes Roselee Nova, MD  lisinopril-hydrochlorothiazide (PRINZIDE,ZESTORETIC) 20-12.5 MG tablet Take 1 tablet by mouth daily. 11/08/16  Yes Roselee Nova, MD  metoprolol tartrate (LOPRESSOR) 25 MG tablet Take 0.5 tablets (12.5 mg total) by mouth 2 (two) times daily. 12/19/16  Yes Mauricio Gerome Apley, MD  potassium chloride (K-DUR) 10 MEQ tablet Take 1 tablet (10 mEq total) by mouth daily. 12/19/16  Yes Mauricio Gerome Apley, MD  sitaGLIPtin (JANUVIA) 100 MG tablet Take 1 tablet (100 mg total) by mouth daily. 11/16/16  Yes Roselee Nova, MD    Family History  Problem Relation Age of Onset  . Dementia Father   . COPD Sister   . Breast cancer Sister   . Lung cancer Sister   . Lung cancer Mother   . Heart failure Brother   . Kidney failure Brother      Social History  Substance Use Topics  . Smoking status: Former Smoker    Quit date: 08/20/1995  . Smokeless tobacco: Never Used  . Alcohol use No  Allergies as of 12/20/2016 - Review Complete 12/20/2016  Allergen Reaction Noted  . Sulfa antibiotics Anaphylaxis and Hives 12/08/2014  . Other Nausea And Vomiting 12/18/2016    Review of Systems:    All systems reviewed and negative except where noted in HPI.   Physical Exam:  Vital signs in last 24 hours: Temp:  [98 F (36.7 C)-98.2 F (36.8 C)] 98.2 F (36.8 C) (04/18 0802) Pulse Rate:  [90-102] 98 (04/18 0802) Resp:  [16-22] 18 (04/18 0802) BP: (98-118)/(34-51) 112/50 (04/18 0802) SpO2:  [93 %-100 %] 97 % (04/18 0802) Weight:  [221 lb (100.2 kg)-227 lb 3.2 oz (103.1 kg)] 227 lb 3.2 oz (103.1 kg) (04/18 0002) Last BM Date: 12/20/16 General:   Pleasant, cooperative in NAD Head:  Normocephalic and atraumatic. Eyes:   No icterus.   Conjunctiva pink.  PERRLA. Ears:  Normal auditory acuity. Neck:  Supple; no masses or thyroidomegaly Lungs: Respirations  Decreased air entry b/l  Heart:  Regular rate and rhythm;  Without murmur, clicks, rubs or gallops Abdomen:  Soft, nondistended, nontender. Normal bowel sounds. No appreciable masses or hepatomegaly.  No rebound or guarding.  Rectal:  Not performed. Neurologic:  Alert and oriented x3;  grossly normal neurologically. Psych:  Alert and cooperative. Normal affect.  LAB RESULTS:  Recent Labs  12/19/16 1141 12/20/16 1741 12/21/16 0502  WBC 15.6* 17.0* 14.9*  HGB 8.4* 7.5* 6.3*  HCT 26.6* 23.2* 19.3*  PLT 383 435 328   BMET  Recent Labs  12/20/16 1741 12/21/16 0502  NA 135 139  K 4.0 3.0*  CL 93* 100*  CO2 31 33*  GLUCOSE 448* 288*  BUN 53* 45*  CREATININE 0.98 0.87  CALCIUM 8.7* 8.2*   LFT No results for input(s): PROT, ALBUMIN, AST, ALT, ALKPHOS, BILITOT, BILIDIR, IBILI in the last 72 hours. PT/INR  Recent Labs  12/19/16 0328  LABPROT 13.6  INR 1.04    STUDIES: Dg Chest 2 View  Result Date: 12/20/2016 CLINICAL DATA:  Acute onset of hyperglycemia and leukocytosis. Initial encounter. EXAM: CHEST  2 VIEW COMPARISON:  Chest radiograph performed 09/13/2016, and CT of the chest performed 12/15/2016 FINDINGS: The known right hilar mass is not well characterized, with post-obstructive collapse again noted at the right midlung zone. No pleural effusion or pneumothorax is seen. The heart is normal in size. No acute osseous abnormalities are identified. IMPRESSION: Known right hilar mass is not well characterized. Post-obstructive collapse again noted at the right midlung zone. This could reflect mild pneumonia, depending on the patient's symptoms. Electronically Signed   By: Garald Balding M.D.   On: 12/20/2016 19:02   BP (!) 104/42   Pulse 98   Temp 98.2 F (36.8 C) (Oral)   Resp 18   Ht '5\' 6"'$  (1.676 m)   Wt 227 lb 3.2 oz (103.1 kg)   SpO2 98%   BMI 36.67 kg/m      Impression / Plan:   ELOWEN DEBRUYN is a 71 y.o. y/o female who is a Arts development officer witness, recently diagnosed with lesions suggestive of metastatic disease in the liver, rt middle lung lobe collapse possible obstruction with secondary pneumonia, comes into the hospital with a history of black tarry stool , low blood pressure and a Jehovas witness not willing to receive blood.   I had a long discussion with the patient and explained that she is possibly having a GI bleed , usually we would give blood and consider endoscopy . In her case she has a  possible pneumonia, low Hb at 6.3 grams,  Low blood pressure and that the risks of endoscopy and sedation are very high. She is aware of the limitations and does not wish to receive any blood. Discussed with Dr Posey Pronto , if concern for ongoing bleed, then will need a tagged RBC scan followed by vascular intervention . In the mean while agree with IV iron, IV fluids, IV PPI, can also add carafate,     Thank you for involving me in the care of this patient.      LOS: 1 day   Jonathon Bellows, MD  12/21/2016, 8:47 AM

## 2016-12-21 NOTE — Progress Notes (Signed)
MD notified of hgb this am and patients refusal for blood transfusions.

## 2016-12-21 NOTE — Evaluation (Signed)
Physical Therapy Evaluation Patient Details Name: Mackenzie Park MRN: [id number] DOB: Aug 05, 1946 Today's Date: 12/21/2016   History of Present Illness  presented to ER secondary to hyperglycemia, SOB, black stools for several days; admitted with upper GIB.  Current HgB 6.3 (down from 11.4 on 4/11); patient Jehovah's witness.  Of note, patient with recent hospitalization at [facility] with new diagnosis of lung mass and liver lesions, likely metastatic disease per chart.  Clinical Impression  Upon evaluation, patient alert and oriented; follows all commands and demonstrates good insight/safety awareness.  Globally weak and deconditioned due to acute illness; tolerating only 25' of mobility prior to need for seated rest (BORG 6/10). Mild unsteadiness with gait efforts, consistently reaching for furniture/walls for external stabilization. May benefit from trial of RW for optimal safety and overall energy conservation; patient declining at this time. Vitals stable and WFL with all activities; asymptomatic throughout session (except for global fatigue and activity intolerance). Unable to demonstrate ability to consistently complete household distances and activities required for safe discharge home alone.  If patient were to return home, recommend RW and 24/7 sup/assist from family/caregiver; however, feel STR optimal discharge plan at this time. Would benefit from skilled PT to address above deficits and promote optimal return to PLOF; recommend transition to STR upon discharge from acute hospitalization.     Follow Up Recommendations SNF    Equipment Recommendations       Recommendations for Other Services       Precautions / Restrictions Precautions Precautions: Fall Restrictions Weight Bearing Restrictions: No      Mobility  Bed Mobility Overal bed mobility: Modified Independent                Transfers Overall transfer level: Needs assistance Equipment used: None Transfers: Sit  to/from Stand Sit to Stand: Supervision            Ambulation/Gait Ambulation/Gait assistance: Min guard Ambulation Distance (Feet): 25 Feet Assistive device: None       General Gait Details: broad BOS with excessive lateral sway, mild forward trunk flexion; intermittent reaching for furniture/walls for external stabiliation.  Refusing trial of RW at this time.  Stairs            Wheelchair Mobility    Modified Rankin (Stroke Patients Only)       Balance Overall balance assessment: Needs assistance Sitting-balance support: No upper extremity supported;Feet supported Sitting balance-Leahy Scale: Good     Standing balance support: No upper extremity supported Standing balance-Leahy Scale: Fair                               Pertinent Vitals/Pain Pain Assessment: No/denies pain    Home Living Family/patient expects to be discharged to:: Private residence Living Arrangements: Alone Available Help at Discharge: Family;Friend(s) (patient reports she can arrange 24/7 sup, but no one present to confirm) Type of Home: House Home Access: Ramped entrance     Home Layout: One level        Prior Function Level of Independence: Independent         Comments: Indep with ADLs, household and community mobilization; 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  No home O2.     Hand Dominance        Extremity/Trunk Assessment   Upper Extremity Assessment Upper Extremity Assessment: Generalized weakness    Lower Extremity Assessment Lower Extremity Assessment: Generalized weakness (  grossly at least 4-/5 throughout)       Communication   Communication: No difficulties  Cognition Arousal/Alertness: Awake/alert Behavior During Therapy: WFL for tasks assessed/performed Overall Cognitive Status: Within Functional Limits for tasks assessed                                        General  Comments      Exercises Other Exercises Other Exercises: Toilet transfer, SPT without assist device, close sup; sit/stand from [facility] without assist device, close sup.  Limited functional reach outside immediate BOS; tends to utilize contralateral UE for external stabilization as needed. Other Exercises: Educated in HEP-ankle pumps, LAQs, marching-to be performed outside of therapy; patient voiced understanding.   Assessment/Plan    PT Assessment Patient needs continued PT services  PT Problem List Decreased strength;Decreased activity tolerance;Decreased balance;Decreased mobility;Decreased knowledge of use of DME;Decreased safety awareness;Decreased knowledge of precautions       PT Treatment Interventions DME instruction;Gait training;Functional mobility training;Therapeutic activities;Therapeutic exercise;Balance training;Patient/family education    PT Goals (Current goals can be found in the Care Plan section)  Acute Rehab PT Goals Patient Stated Goal: to get my strength back and go home PT Goal Formulation: With patient Time For Goal Achievement: 01/04/17 Potential to Achieve Goals: Good    Frequency Min 2X/week   Barriers to discharge Decreased caregiver support      Co-evaluation               End of Session Equipment Utilized During Treatment: Gait belt;Oxygen Activity Tolerance: Patient limited by fatigue Patient left: in chair;with call bell/phone within reach;with chair alarm set Nurse Communication: Mobility status PT Visit Diagnosis: Muscle weakness (generalized) (M62.81);Unsteadiness on feet (R26.81)    Time: 2081-3887 PT Time Calculation (min) (ACUTE ONLY): 22 min   Charges:   PT Evaluation $PT Eval Moderate Complexity: 1 Procedure PT Treatments $Therapeutic Activity: 8-22 mins   PT G Codes:       Donnie Gedeon H. Owens Shark, PT, DPT, NCS 12/21/16, 10:19 AM [phone]

## 2016-12-22 DIAGNOSIS — K921 Melena: Principal | ICD-10-CM

## 2016-12-22 DIAGNOSIS — R918 Other nonspecific abnormal finding of lung field: Secondary | ICD-10-CM

## 2016-12-22 DIAGNOSIS — D649 Anemia, unspecified: Secondary | ICD-10-CM

## 2016-12-22 DIAGNOSIS — K769 Liver disease, unspecified: Secondary | ICD-10-CM

## 2016-12-22 DIAGNOSIS — J9811 Atelectasis: Secondary | ICD-10-CM

## 2016-12-22 LAB — IRON AND TIBC
Iron: 333 ug/dL — ABNORMAL HIGH (ref 28–170)
TIBC: 243 ug/dL — ABNORMAL LOW (ref 250–450)

## 2016-12-22 LAB — GLUCOSE, CAPILLARY
GLUCOSE-CAPILLARY: 273 mg/dL — AB (ref 65–99)
GLUCOSE-CAPILLARY: 238 mg/dL — AB (ref 65–99)
Glucose-Capillary: 265 mg/dL — ABNORMAL HIGH (ref 65–99)
Glucose-Capillary: 212 mg/dL — ABNORMAL HIGH (ref 65–99)

## 2016-12-22 LAB — FERRITIN: Ferritin: 111 ng/mL (ref 11–307)

## 2016-12-22 MED ORDER — ADULT MULTIVITAMIN W/MINERALS CH
1.0000 | ORAL_TABLET | Freq: Every day | ORAL | Status: DC
  Administered 2016-12-22 – 2016-12-23 (×2): 1 via ORAL
  Filled 2016-12-22 (×2): qty 1

## 2016-12-22 MED ORDER — DARBEPOETIN ALFA 300 MCG/0.6ML IJ SOSY
300.0000 ug | PREFILLED_SYRINGE | Freq: Once | INTRAMUSCULAR | Status: AC
  Administered 2016-12-22: 300 ug via SUBCUTANEOUS
  Filled 2016-12-22: qty 0.6

## 2016-12-22 MED ORDER — GLUCERNA SHAKE PO LIQD
237.0000 mL | Freq: Two times a day (BID) | ORAL | Status: DC
  Administered 2016-12-23: 237 mL via ORAL

## 2016-12-22 MED ORDER — PANTOPRAZOLE SODIUM 40 MG IV SOLR
40.0000 mg | Freq: Two times a day (BID) | INTRAVENOUS | Status: DC
  Administered 2016-12-22 (×2): 40 mg via INTRAVENOUS
  Filled 2016-12-22 (×2): qty 40

## 2016-12-22 MED ORDER — DARBEPOETIN ALFA 300 MCG/0.6ML IJ SOSY: 300.0000 ug | PREFILLED_SYRINGE | Freq: Once | INTRAMUSCULAR | Status: DC

## 2016-12-22 MED ORDER — SODIUM CHLORIDE 0.9 % IV SOLN
400.0000 mg | Freq: Once | INTRAVENOUS | Status: AC
  Administered 2016-12-22: 400 mg via INTRAVENOUS
  Filled 2016-12-22: qty 20

## 2016-12-22 NOTE — Progress Notes (Addendum)
[place] [facility] [place] NAME: Mackenzie Park    MR#:  [id number]  DATE OF BIRTH:  1946/06/20  SUBJECTIVE:   Came in with SOB and melena Pt is Jehovah's witness No new complaints. Tolerating po CLD REVIEW OF SYSTEMS:   Review of Systems  Constitutional: Negative for chills, fever and weight loss.  HENT: Negative for ear discharge, ear pain and nosebleeds.   Eyes: Negative for blurred vision, pain and discharge.  Respiratory: Negative for sputum production, shortness of breath, wheezing and stridor.   Cardiovascular: Negative for chest pain, palpitations, orthopnea and PND.  Gastrointestinal: Positive for melena. Negative for abdominal pain, diarrhea, nausea and vomiting.  Genitourinary: Negative for frequency and urgency.  Musculoskeletal: Negative for back pain and joint pain.  Neurological: Positive for weakness. Negative for sensory change, speech change and focal weakness.  Psychiatric/Behavioral: Negative for depression and hallucinations. The patient is not nervous/anxious.    Tolerating Diet:yes Tolerating PT: rehab  DRUG ALLERGIES:   Allergies  Allergen Reactions  . Sulfa Antibiotics Anaphylaxis and Hives  . Other Nausea And Vomiting    Milk products    VITALS:  Blood pressure (!) 102/42, pulse 96, temperature 97.8 F (36.6 C), temperature source Oral, resp. rate 17, height '5\' 6"'$  (1.676 m), weight 103.1 kg (227 lb 3.2 oz), SpO2 98 %.  PHYSICAL EXAMINATION:   Physical Exam  GENERAL:  71 y.o.-year-old patient lying in the bed with no acute distress. obese EYES: Pupils equal, round, reactive to light and accommodation. No scleral icterus. Extraocular muscles intact. Pallor++ HEENT: Head atraumatic, normocephalic. Oropharynx and nasopharynx clear.  NECK:  Supple, no jugular venous distention. No thyroid enlargement, no tenderness.  LUNGS: Normal breath sounds bilaterally, no wheezing, rales, rhonchi. No use of accessory  muscles of respiration.  CARDIOVASCULAR: S1, S2 normal. No murmurs, rubs, or gallops.  ABDOMEN: Soft, nontender, nondistended. Bowel sounds present. No organomegaly or mass.  EXTREMITIES: No cyanosis, clubbing or edema b/l.    NEUROLOGIC: Cranial nerves II through XII are intact. No focal Motor or sensory deficits b/l.   PSYCHIATRIC:  patient is alert and oriented x 3.  SKIN: No obvious rash, lesion, or ulcer.   LABORATORY PANEL:  CBC  Recent Labs Lab 12/21/16 0502  WBC 14.9*  HGB 6.3*  HCT 19.3*  PLT 328    Chemistries   Recent Labs Lab 12/21/16 0502  NA 139  K 3.0*  CL 100*  CO2 33*  GLUCOSE 288*  BUN 45*  CREATININE 0.87  CALCIUM 8.2*   Cardiac Enzymes No results for input(s): TROPONINI in the last 168 hours. RADIOLOGY:  Dg Chest 2 View  Result Date: 12/20/2016 CLINICAL DATA:  Acute onset of hyperglycemia and leukocytosis. Initial encounter. EXAM: CHEST  2 VIEW COMPARISON:  Chest radiograph performed 09/13/2016, and CT of the chest performed 12/15/2016 FINDINGS: The known right hilar mass is not well characterized, with post-obstructive collapse again noted at the right midlung zone. No pleural effusion or pneumothorax is seen. The heart is normal in size. No acute osseous abnormalities are identified. IMPRESSION: Known right hilar mass is not well characterized. Post-obstructive collapse again noted at the right midlung zone. This could reflect mild pneumonia, depending on the patient's symptoms. Electronically Signed   By: Garald Balding M.D.   On: 12/20/2016 19:02   ASSESSMENT AND PLAN:  Mackenzie Park  is a 72 y.o. female stated she can't get her sugars down. She was recently discharged from [facility].  She was told that she has a lung mass and some liver lesions. She was therefore congestive heart failure. She's not been doing well at home. She is having shortness of breath especially when lying flat. She states that she's having black tarry stools for the  past 6 days  1. Upper GI bleed with melena appears Acute blood loss anemia. - Hemoglobin dropped from 11.4 down to 7.5---6.3. Patient is a Sales promotion account executive Witness. -was IV Protonix drip --change to IV bid dosing to avoid more blood dilution. Aspirin stopped. - Clear liquid diet ---FLD - Case discussed with Dr. Vicente Males gastroenterology -- high risk for any procedure given low hgb counts.  -received IV iron x2 and Aranesp today  2. Lung mass and liver masses and adrenal mass. Likely metastatic lung cancer.  -Oncology consultation appreciated - Patient is a DO NOT RESUSCITATE.  -f/u as out pt  3. Acute on chronic diastolic congestive heart failure. -recieved IV Lasix 40 mg IV twice a day. Blood pressure too low for other medications at this point. -change to oral meds -hold lisinopril due to low bp -coreg   4. Relative hypotension. Likely with blood loss. we would rather not give IV fluids of vital have to to further dilute the hemoglobin. Unable to give blood. Hold antihypertensive medications except for Lasix and coreg low dose at this point -hold lisinopril  5. Type 2 diabetes mellitus uncontrolled sugars. - Start sliding scale and low dose Lantus.  6. Palliative care consultation for goals of care  7. Weakness physical therapy evaluation noted. Recommends rehab  Case discussed with Care Management/Social Worker. Management plans discussed with the patient, family and they are in agreement.  CODE STATUS: DNR  DVT Prophylaxis: SCD  TOTAL TIME TAKING CARE OF THIS PATIENT: 30 minutes.  >50% time spent on counselling and coordination of care Dr Vicente Males and Dr Rogue Bussing  POSSIBLE D/C IN  1-2DAYS, DEPENDING ON CLINICAL CONDITION.  Note: This dictation was prepared with Dragon dictation along with smaller phrase technology. Any transcriptional errors that result from this process are unintentional.  Mackenzie Park M.D on 12/22/2016 at 8:19 AM  Between 7am to 6pm - Pager -  [phone]  After 6pm go to [email or web address] - password [email or web address] [facility]  Office  [phone]  CC: Primary care physician; Keith Rake, MD

## 2016-12-22 NOTE — Progress Notes (Signed)
Inpatient Diabetes Program Recommendations  AACE/ADA: New Consensus Statement on Inpatient Glycemic Control (2015)  Target Ranges:  Prepandial:   less than 140 mg/dL      Peak postprandial:   less than 180 mg/dL (1-2 hours)      Critically ill patients:  140 - 180 mg/dL   Lab Results  Component Value Date   GLUCAP 273 (H) 12/22/2016   HGBA1C 7.4 11/08/2016    Review of Glycemic Control  Results for Mackenzie Park, Mackenzie Park (MRN [id number]) as of 12/22/2016 11:03  Ref. Range 12/21/2016 00:21 12/21/2016 07:29 12/21/2016 11:36 12/21/2016 16:39 12/21/2016 20:53 12/21/2016 21:19 12/22/2016 07:43  Glucose-Capillary Latest Ref Range: 65 - 99 mg/dL 333 (H) 278 (H) 242 (H) 386 (H) 422 (H) 396 (H) 273 (H)    Diabetes history: Type 2 Outpatient Diabetes medications: Januvia '100mg'$  qday  Current orders for Inpatient glycemic control: Lantus 15 units qhs, Novolog 0-9 units tid, Novolog 0-5 units qhs  Inpatient Diabetes Program Recommendations:  Consider increasing Lantus to 17 units qhs  (fasting blood sugar '273mg'$ /dl this am)  Consider adding mealtime Novolog insulin 3 units tid ( hold if patient eats less than 50% of her meal)  Gentry Fitz, RN, BA, [place], CDE Diabetes Coordinator Inpatient Diabetes Program  [phone] (Team Pager) [phone] ([place]) 12/22/2016 11:13 AM

## 2016-12-22 NOTE — NC FL2 (Signed)
[place] LEVEL OF CARE SCREENING TOOL     IDENTIFICATION  Patient Name: Mackenzie Park Birthdate: 07/26/1946 Sex: female Admission Date (Current Location): 12/20/2016  [place] and [place] Number:  Engineering geologist and Address:  [facility], [address], [place], [place] [address]      Provider Number: [id number]  Attending Physician Name and Address:  Fritzi Mandes, MD  Relative Name and Phone Number:       Current Level of Care: Hospital Recommended Level of Care: [place] Prior Approval Number:    Date Approved/Denied:   PASRR Number: [id number]  Discharge Plan: SNF    Current Diagnoses: Patient Active Problem List   Diagnosis Date Noted  . Pneumonia 12/20/2016  . Abnormal LFTs 12/12/2016  . Acute on chronic diastolic CHF (congestive heart failure) ([place]) 12/12/2016  . CHF exacerbation ([place]) 12/12/2016  . Abnormal liver function   . Hyperglycemia   . Transaminitis   . Weakness   . 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 Abnormal finding on thyroid function test 02/05/2015  . Breast screening 02/05/2015  . Cervical disc disease 02/05/2015  . Pain in shoulder 02/05/2015  . DDD (degenerative disc disease), lumbar 02/05/2015  . Dizziness 02/05/2015  . Neuropathy 02/05/2015  . Extreme obesity 02/05/2015  . Stasis, venous 02/05/2015  . Obesity 12/08/2014  . Diabetic neuropathy ([place]) 12/08/2014  . Hypertension 12/08/2014  . Hyperlipidemia 12/08/2014  . Infraspinatus tenosynovitis 09/18/2014  . Other synovitis and tenosynovitis, right shoulder 09/18/2014  . Allergic rhinitis, seasonal 01/09/2014  . Clinical depression 11/09/2009  . Osteoarthrosis 12/19/2008  . Diabetes mellitus, type 2 ([place]) 06/18/2007    Orientation RESPIRATION BLADDER Height & Weight     Self, Time, Situation, Place  Normal, O2 (3) Continent Weight: 227 lb 3.2 oz (103.1 kg) Height:  '5\' 6"'$  (167.6 cm)  BEHAVIORAL  SYMPTOMS/MOOD NEUROLOGICAL BOWEL NUTRITION STATUS   (none)  (none) Continent Diet (full liquid to be advanced)  AMBULATORY STATUS COMMUNICATION OF NEEDS Skin   Limited Assist Verbally Normal                       Personal Care Assistance Level of Assistance  Bathing, Dressing Bathing Assistance: Limited assistance   Dressing Assistance: Limited assistance     Functional Limitations Info   (none)          SPECIAL CARE FACTORS FREQUENCY  PT (By licensed PT)                    Contractures Contractures Info: Not present    Additional Factors Info  Code Status, Allergies Code Status Info: dnr Allergies Info: sulfa abx           Current Medications (12/22/2016):  This is the current hospital active medication list Current Facility-Administered Medications  Medication Dose Route Frequency Provider Last Rate Last Dose  . Darbepoetin Alfa (ARANESP) injection 300 mcg  300 mcg Subcutaneous Once Cammie Sickle, MD      . feeding supplement (GLUCERNA SHAKE) (GLUCERNA SHAKE) liquid 237 mL  237 mL Oral BID BM Fritzi Mandes, MD      . fluticasone (FLONASE) 50 MCG/ACT nasal spray 1 spray  1 spray Each Nare Daily Loletha Grayer, MD      . furosemide (LASIX) tablet 20 mg  20 mg Oral Daily Fritzi Mandes, MD   20 mg at 12/21/16 1046  . gabapentin (NEURONTIN) capsule 300 mg  300 mg  Oral QHS Loletha Grayer, MD   300 mg at 12/21/16 2139  . insulin aspart (novoLOG) injection 0-5 Units  0-5 Units Subcutaneous QHS Loletha Grayer, MD   5 Units at 12/21/16 2140  . insulin aspart (novoLOG) injection 0-9 Units  0-9 Units Subcutaneous TID WC Loletha Grayer, MD   5 Units at 12/22/16 [phone]  . insulin glargine (LANTUS) injection 15 Units  15 Units Subcutaneous QHS Fritzi Mandes, MD   15 Units at 12/21/16 2140  . iron sucrose (VENOFER) 400 mg in sodium chloride 0.9 % 250 mL IVPB  400 mg Intravenous Once Cammie Sickle, MD      . levofloxacin (LEVAQUIN) tablet 500 mg  500 mg Oral Daily  Fritzi Mandes, MD   500 mg at 12/21/16 1542  . multivitamin with minerals tablet 1 tablet  1 tablet Oral Daily Fritzi Mandes, MD      . pantoprazole (PROTONIX) injection 40 mg  40 mg Intravenous Q12H Fritzi Mandes, MD   40 mg at 12/22/16 0840  . sucralfate (CARAFATE) 1 GM/10ML suspension 1 g  1 g Oral TID WC & HS Jonathon Bellows, MD   1 g at 12/22/16 0840     Discharge Medications: Please see discharge summary for a list of discharge medications.  Relevant Imaging Results:  Relevant Lab Results:   Additional Information ss: [id number]  Shela Leff, LCSW

## 2016-12-22 NOTE — Clinical Social Work Note (Signed)
Clinical Social Work Assessment  Patient Details  Name: Mackenzie Park MRN: [id number] Date of Birth: 06-May-1946  Date of referral:  12/22/16               Reason for consult:  Facility Placement                Permission sought to share information with:  Family Supports Permission granted to share information::  Yes, Verbal Permission Granted  Name::        Agency::     Relationship::     Contact Information:     Housing/Transportation Living arrangements for the past 2 months:  Single Family Home Source of Information:  Patient Patient Interpreter Needed:  None Criminal Activity/Legal Involvement Pertinent to Current Situation/Hospitalization:  No - Comment as needed Significant Relationships:  Friend (niece) Lives with:  Self Do you feel safe going back to the place where you live?  Yes Need for family participation in patient care:  No (Coment)  Care giving concerns:  PT has assessed patient and recommending short term rehab.   Social Worker assessment / plan:  CSW met with patient this afternoon and she gave permission for me to speak in front of her family and friends that were visiting. CSW explained role and purpose of visit. Patient informed CSW that she was not interested in going to any facility for rehab but stated she would be agreeable for home health to come into her home. Patient states that she can walk with her two legs and is able to do what she needs to do. RN CM informed of patient's decision.  Employment status:  Retired Nurse, adult PT Recommendations:  [place] / Referral to community resources:     Patient/Family's Response to care:  Patient expressed appreciation for CSW assistance.  Patient/Family's Understanding of and Emotional Response to Diagnosis, Current Treatment, and Prognosis:  Patient believes she is capable of returning back to her home at this time.   Emotional  Assessment Appearance:  Appears stated age Attitude/Demeanor/Rapport:   (pleasant) Affect (typically observed):  Calm, Pleasant Orientation:  Oriented to Self, Oriented to Place, Oriented to  Time, Oriented to Situation Alcohol / Substance use:  Not Applicable Psych involvement (Current and /or in the community):  No (Comment)  Discharge Needs  Concerns to be addressed:  Care Coordination Readmission within the last 30 days:  No Current discharge risk:  None Barriers to Discharge:  No Barriers Identified   Shela Leff, LCSW 12/22/2016, 3:14 PM

## 2016-12-22 NOTE — Progress Notes (Signed)
SNF Benefits:  Number called: [phone] Rep: Sharrie Rothman Reference Number: [id number]  [facility] Medicare Advantage HMO policy active as of 0/5/39 with no deductible. Out of pocket max is $5900, $135 met as of time of call.     In-network SNF: $0 copay for days 1-20 and a $167 daily copay for days 21-100.  Once out of pocket is reached, patient covered at 100% for remainder of 100 day benefit period.   Josem Kaufmann is required: [phone].

## 2016-12-22 NOTE — Progress Notes (Signed)
Mackenzie Park   DOB:September 11, 1945   AO#:[id number]    Subjective: Denies any significant black colored stools. Appetite is good. No nausea no vomiting. Denies any pain. Anxious to go home. No chest pain. Shortness of breath with exertion. Overall improved. No reactions from IV iron infusions.  Objective:  Vitals:   12/22/16 0506 12/22/16 1300  BP: (!) 102/42 (!) 100/48  Pulse: 96 87  Resp: 17 20  Temp: 97.8 F (36.6 C) 97.6 F (36.4 C)     Intake/Output Summary (Last 24 hours) at 12/22/16 1541 Last data filed at 12/22/16 1300  Gross per 24 hour  Intake              649 ml  Output              400 ml  Net              249 ml    GENERAL: Well-nourished well-developed; Alert, no distress and comfortable. She is alone.  EYES: positive for pallor no icterus.  OROPHARYNX: no thrush or ulceration. NECK: supple, no masses felt LYMPH:  no palpable lymphadenopathy in the cervical, axillary or inguinal regions LUNGS: decreased breath sounds to auscultation at bases and  No wheeze or crackles HEART/CVS: regular rate & rhythm and no murmurs; No lower extremity edema ABDOMEN: abdomen soft, non-tender and normal bowel sounds; positive for hepatomegaly.  Musculoskeletal:no cyanosis of digits and no clubbing  PSYCH: alert & oriented x 3 with fluent speech NEURO: no focal motor/sensory deficits SKIN:  no rashes or significant lesions  Labs:  Lab Results  Component Value Date   WBC 14.9 (H) 12/21/2016   HGB 6.3 (L) 12/21/2016   HCT 19.3 (L) 12/21/2016   MCV 85.5 12/21/2016   PLT 328 12/21/2016   NEUTROABS 13.1 (H) 12/14/2016    Lab Results  Component Value Date   NA 139 12/21/2016   K 3.0 (L) 12/21/2016   CL 100 (L) 12/21/2016   CO2 33 (H) 12/21/2016    Studies:  Dg Chest 2 View  Result Date: 12/20/2016 CLINICAL DATA:  Acute onset of hyperglycemia and leukocytosis. Initial encounter. EXAM: CHEST  2 VIEW COMPARISON:  Chest radiograph performed 09/13/2016, and CT of the chest  performed 12/15/2016 FINDINGS: The known right hilar mass is not well characterized, with post-obstructive collapse again noted at the right midlung zone. No pleural effusion or pneumothorax is seen. The heart is normal in size. No acute osseous abnormalities are identified. IMPRESSION: Known right hilar mass is not well characterized. Post-obstructive collapse again noted at the right midlung zone. This could reflect mild pneumonia, depending on the patient's symptoms. Electronically Signed   By: Garald Balding M.D.   On: 12/20/2016 19:02    Assessment & Plan:   # 71 year old female patient currently admitted to the hospital for severe anemia/melena- with suspected malignancy based on liver lesions/lung mass.   # Multiple liver lesions/ right middle lobe collapse/ ~ mass measuring about 2 cm- highly suspicious for malignancy. Reviewed imaging at the tumor conference today. Discussed with interventional radiology. Given her severe anemia/high risk for complications-from anesthesia. High-risk of morbidity and mortality if any complications with the procedure itself.   # Will reevaluate the patient as outpatient- and if the hemoglobin/trend is up- then possibly recommend biopsy. Patient is not too keen on any invasive procedures. She understands this is likely metastatic disease/incurable. Outpatient prior to care/hospice discussion will be initiated.  # acute severe anemia hemoglobin 6.3 [Jehovah's Witness-declines blood products]-  ordered Venofer again this morning. Also ordered Aranesp 300 mg 1.   # Patient likely to be discharged home tomorrow; patient will follow-up with me in approximately a week or so to discuss further treatment options. She plans to go home/stated that she has significant help at home. Discussed with Dr. Posey Pronto.  Cammie Sickle, MD 12/22/2016  3:41 PM

## 2016-12-23 ENCOUNTER — Other Ambulatory Visit: Payer: Self-pay | Admitting: *Deleted

## 2016-12-23 DIAGNOSIS — D509 Iron deficiency anemia, unspecified: Secondary | ICD-10-CM

## 2016-12-23 LAB — GLUCOSE, CAPILLARY
GLUCOSE-CAPILLARY: 160 mg/dL — AB (ref 65–99)
GLUCOSE-CAPILLARY: 198 mg/dL — AB (ref 65–99)

## 2016-12-23 LAB — HEMOGLOBIN: Hemoglobin: 6.2 g/dL — ABNORMAL LOW (ref 12.0–16.0)

## 2016-12-23 MED ORDER — POLYSACCHARIDE IRON COMPLEX 150 MG PO CAPS: 150.0000 mg | ORAL_CAPSULE | Freq: Every day | ORAL | 0 refills | Status: AC

## 2016-12-23 MED ORDER — PANTOPRAZOLE SODIUM 40 MG PO TBEC: 40.0000 mg | DELAYED_RELEASE_TABLET | Freq: Two times a day (BID) | ORAL | Status: DC

## 2016-12-23 MED ORDER — POLYSACCHARIDE IRON COMPLEX 150 MG PO CAPS
150.0000 mg | ORAL_CAPSULE | Freq: Every day | ORAL | Status: DC
Start: 2016-12-23 — End: 2016-12-23
  Administered 2016-12-23: 150 mg via ORAL
  Filled 2016-12-23: qty 1

## 2016-12-23 MED ORDER — LEVOFLOXACIN 500 MG PO TABS
500.0000 mg | ORAL_TABLET | Freq: Every day | ORAL | 0 refills | Status: DC
Start: 2016-12-23 — End: 2016-12-28

## 2016-12-23 MED ORDER — PANTOPRAZOLE SODIUM 40 MG PO TBEC: 40.0000 mg | DELAYED_RELEASE_TABLET | Freq: Two times a day (BID) | ORAL | 1 refills | Status: AC

## 2016-12-23 MED ORDER — INSULIN GLARGINE 100 UNIT/ML ~~LOC~~ SOLN: 15.0000 [IU] | Freq: Every day | SUBCUTANEOUS | 11 refills | Status: DC

## 2016-12-23 MED ORDER — SUCRALFATE 1 GM/10ML PO SUSP: 1.0000 g | Freq: Three times a day (TID) | ORAL | 0 refills | Status: AC

## 2016-12-23 MED ORDER — ADULT MULTIVITAMIN W/MINERALS CH
1.0000 | ORAL_TABLET | Freq: Every day | ORAL | 0 refills | Status: AC
Start: 2016-12-23 — End: ?

## 2016-12-23 MED ORDER — GLUCERNA SHAKE PO LIQD: 237.0000 mL | Freq: Two times a day (BID) | ORAL | 0 refills | Status: AC

## 2016-12-23 NOTE — Care Management (Signed)
Patient admitted with GI bleed.  Patient lives at home alone.  Patient states that she has a niece that lives locally for support.  States that she also has a strong network of friends.  PCP Manuella Ghazi.  Pharmacy [facility] mail pharmacy and [place] on [address].  PT has assessed patient and recommended SNF.  Patient has declined SNF.  Home health orders have been placed for RN, PT, aide, and RW.  Patient was provided with home health agency preference.  Patient states that she does not have a preference.  Referral was made to [facility] with [facility].  [place] to be delivered prior to discharge.  RNCM signing off.

## 2016-12-23 NOTE — Progress Notes (Signed)
Inpatient Diabetes Program Recommendations  AACE/ADA: New Consensus Statement on Inpatient Glycemic Control (2015)  Target Ranges:  Prepandial:   less than 140 mg/dL      Peak postprandial:   less than 180 mg/dL (1-2 hours)      Critically ill patients:  140 - 180 mg/dL   Results for PORSHE, FLEAGLE (MRN [id number]) as of 12/23/2016 08:52  Ref. Range 12/22/2016 07:43 12/22/2016 11:34 12/22/2016 17:11 12/22/2016 21:07 12/23/2016 07:36  Glucose-Capillary Latest Ref Range: 65 - 99 mg/dL 273 (H) 265 (H) 212 (H) 238 (H) 160 (H)   Review of Glycemic Control  Current orders for Inpatient glycemic control: Lantus 15 units QHS, Novolog 0-9 units TID with meals, Novolog 0-5 units QHS  Inpatient Diabetes Program Recommendations: Insulin - Meal Coverage: Please consider ordering Novolog 3 units TID with meals for meal coverage if patient eats at least 50% of meals.  Thanks, Barnie Alderman, RN, MSN, CDE Diabetes Coordinator Inpatient Diabetes Program [phone] (Team Pager from 8am to 5pm)

## 2016-12-23 NOTE — Progress Notes (Signed)
Patient discharged to home. Concerns addressed. Prescription provided. Instructed patient that oncology are to follow up with patient on appointment date and time.

## 2016-12-23 NOTE — Discharge Summary (Signed)
[facility] [place] NAME: Mackenzie Park    MR#:  [id number]  DATE OF BIRTH:  12/07/1945  DATE OF ADMISSION:  12/20/2016 ADMITTING PHYSICIAN: Loletha Grayer, MD  DATE OF DISCHARGE: 12/23/16  PRIMARY CARE PHYSICIAN: Keith Rake, MD    ADMISSION DIAGNOSIS:  HCAP (healthcare-associated pneumonia) [J18.9] UGIB (upper gastrointestinal bleed) [K92.2] Type 2 diabetes mellitus with hyperglycemia, without long-term current use of insulin ([place]) [E11.65]  DISCHARGE DIAGNOSIS:  Acute Blood loss anemia suspected Upper GI bleed (w/u held due to pt being Jehovah's witness and low hgb) Lung mass with suspected mets to liver ([place]) Acute on chronic CHF, diastolic Post obstructive Pneumonia  SECONDARY DIAGNOSIS:   Past Medical History:  Diagnosis Date  . Allergic rhinitis   . CHF (congestive heart failure) ([place])   . Depressive disorder   . Diabetes mellitus ([place])   . Hypercholesteremia   . Hypertension   . Lumbosacral neuritis   . Osteoarthritis     HOSPITAL COURSE:   Mackenzie Park a 71 y.o.femalestated she can't get her sugars down. She was recently discharged from [facility]. She was told that she has a lung mass and some liver lesions. She was therefore congestive heart failure. She's not been doing well at home. She is having shortness of breath especially when lying flat. She states that she's having black tarry stools for the past 6 days  1. Upper GI bleed with melena appears Acute blood loss anemia. - Hemoglobin dropped from 11.4 down to 7.5---6.3--6.2. Patient is a Sales promotion account executive Witness. -was IV Protonix drip --change to IV bid dosing to avoid more blood dilution--now on oral PPI - Aspirin stopped. - Clear liquid diet ---FLD - Case discussed with Dr. Vicente Males gastroenterology -- high risk for any procedure given low hgb counts.  -received IV iron x2 and Aranesp  -will start po iron. Pt will f/u Dr Rogue Bussing as outpt  2.  Lung mass and liver masses and adrenal mass. Likely metastatic lung cancer.  -Oncology consultation appreciated - Patient is a DO NOT RESUSCITATE.  -f/u as out pt  3. Acute on chronic diastolic congestive heart failure. -recieved IV Lasix 40 mg IV twice a day. Blood pressure too low for other medications at this point. -change to oral meds coreg, laosix and lisinopril  4. Relative hypotension. Likely with blood loss-improved -coreg, lasix and lisinopril  5. Type 2 diabetes mellitus uncontrolled sugars. - Start sliding scale and low dose Lantus.  6. Palliative care consultation for goals of care -pt is DNR  7. Weakness physical therapy evaluation noted. Recommends rehab but pt declines and wants to be with family HHPT, RN and aide  D/c home CONSULTS OBTAINED:  Treatment Team:  Cammie Sickle, MD  DRUG ALLERGIES:   Allergies  Allergen Reactions  . Sulfa Antibiotics Anaphylaxis and Hives  . Other Nausea And Vomiting    Milk products    DISCHARGE MEDICATIONS:   Current Discharge Medication List    START taking these medications   Details  feeding supplement, GLUCERNA SHAKE, (GLUCERNA SHAKE) LIQD Take 237 mLs by mouth 2 (two) times daily between meals. Qty: 30 Can, Refills: 0    insulin glargine (LANTUS) 100 UNIT/ML injection Inject 0.15 mLs (15 Units total) into the skin at bedtime. Qty: 10 mL, Refills: 11    iron polysaccharides (NIFEREX) 150 MG capsule Take 1 capsule (150 mg total) by mouth daily. Qty: 30 capsule, Refills: 0    levofloxacin (LEVAQUIN) 500 MG tablet  Take 1 tablet (500 mg total) by mouth daily. Qty: 3 tablet, Refills: 0    Multiple Vitamin (MULTIVITAMIN WITH MINERALS) TABS tablet Take 1 tablet by mouth daily. Qty: 30 tablet, Refills: 0    pantoprazole (PROTONIX) 40 MG tablet Take 1 tablet (40 mg total) by mouth 2 (two) times daily before a meal. Qty: 60 tablet, Refills: 1    sucralfate (CARAFATE) 1 GM/10ML suspension Take 10 mLs (1 g  total) by mouth 4 (four) times daily -  with meals and at bedtime. Qty: 420 mL, Refills: 0      CONTINUE these medications which have NOT CHANGED   Details  fluticasone (FLONASE) 50 MCG/ACT nasal spray Place 1-2 sprays into both nostrils daily as needed for allergies.     furosemide (LASIX) 20 MG tablet Take 1 tablet (20 mg total) by mouth daily. Qty: 30 tablet, Refills: 0    gabapentin (NEURONTIN) 300 MG capsule Take 1 capsule (300 mg total) by mouth at bedtime. Qty: 90 capsule, Refills: 0   Associated Diagnoses: Uncontrolled type 2 diabetes mellitus with peripheral neuropathy (HCC)    lisinopril-hydrochlorothiazide (PRINZIDE,ZESTORETIC) 20-12.5 MG tablet Take 1 tablet by mouth daily. Qty: 90 tablet, Refills: 0    metoprolol tartrate (LOPRESSOR) 25 MG tablet Take 0.5 tablets (12.5 mg total) by mouth 2 (two) times daily. Qty: 60 tablet, Refills: 0    potassium chloride (K-DUR) 10 MEQ tablet Take 1 tablet (10 mEq total) by mouth daily. Qty: 30 tablet, Refills: 0    sitaGLIPtin (JANUVIA) 100 MG tablet Take 1 tablet (100 mg total) by mouth daily. Qty: 270 tablet, Refills: 0   Associated Diagnoses: Uncontrolled type 2 diabetes mellitus with peripheral neuropathy (HCC)      STOP taking these medications     aspirin EC 81 MG tablet         If you experience worsening of your admission symptoms, develop shortness of breath, life threatening emergency, suicidal or homicidal thoughts you must seek medical attention immediately by calling 911 or calling your MD immediately  if symptoms less severe.  You Must read complete instructions/literature along with all the possible adverse reactions/side effects for all the Medicines you take and that have been prescribed to you. Take any new Medicines after you have completely understood and accept all the possible adverse reactions/side effects.   Please note  You were cared for by a hospitalist during your hospital stay. If you have any  questions about your discharge medications or the care you received while you were in the hospital after you are discharged, you can call the unit and asked to speak with the hospitalist on call if the hospitalist that took care of you is not available. Once you are discharged, your primary care physician will handle any further medical issues. Please note that NO REFILLS for any discharge medications will be authorized once you are discharged, as it is imperative that you return to your primary care physician (or establish a relationship with a primary care physician if you do not have one) for your aftercare needs so that they can reassess your need for medications and monitor your lab values. Today   SUBJECTIVE   Doing well  VITAL SIGNS:  Blood pressure (!) 113/45, pulse 90, temperature 98.1 F (36.7 C), temperature source Oral, resp. rate 16, height '5\' 6"'$  (1.676 m), weight 103.1 kg (227 lb 3.2 oz), SpO2 94 %.  I/O:   Intake/Output Summary (Last 24 hours) at 12/23/16 0949 Last data filed at  12/22/16 1800  Gross per 24 hour  Intake              317 ml  Output                0 ml  Net              317 ml    PHYSICAL EXAMINATION:  GENERAL:  71 y.o.-year-old patient lying in the bed with no acute distress. obese EYES: Pupils equal, round, reactive to light and accommodation. No scleral icterus. Extraocular muscles intact.  HEENT: Head atraumatic, normocephalic. Oropharynx and nasopharynx clear. Pallor+ NECK:  Supple, no jugular venous distention. No thyroid enlargement, no tenderness.  LUNGS: Normal breath sounds bilaterally, no wheezing, rales,rhonchi or crepitation. No use of accessory muscles of respiration.  CARDIOVASCULAR: S1, S2 normal. No murmurs, rubs, or gallops.  ABDOMEN: Soft, non-tender, non-distended. Bowel sounds present. No organomegaly or mass.  EXTREMITIES: No pedal edema, cyanosis, or clubbing.  NEUROLOGIC: Cranial nerves II through XII are intact. Muscle strength 5/5  in all extremities. Sensation intact. Gait not checked.  PSYCHIATRIC: The patient is alert and oriented x 3.  SKIN: No obvious rash, lesion, or ulcer.   DATA REVIEW:   CBC   Recent Labs Lab 12/21/16 0502 12/23/16 0445  WBC 14.9*  --   HGB 6.3* 6.2*  HCT 19.3*  --   PLT 328  --     Chemistries   Recent Labs Lab 12/21/16 0502  NA 139  K 3.0*  CL 100*  CO2 33*  GLUCOSE 288*  BUN 45*  CREATININE 0.87  CALCIUM 8.2*    Microbiology Results   Recent Results (from the past 240 hour(s))  MRSA PCR Screening     Status: None   Collection Time: 12/21/16  5:54 AM  Result Value Ref Range Status   MRSA by PCR NEGATIVE NEGATIVE Final    Comment:        The GeneXpert MRSA Assay (FDA approved for NASAL specimens only), is one component of a comprehensive MRSA colonization surveillance program. It is not intended to diagnose MRSA infection nor to guide or monitor treatment for MRSA infections.     RADIOLOGY:  No results found.   Management plans discussed with the patient, family and they are in agreement.  CODE STATUS:     Code Status Orders        Start     Ordered   12/20/16 2130  Do not attempt resuscitation (DNR)  Continuous    Question Answer Comment  In the event of cardiac or respiratory ARREST Do not call a "code blue"   In the event of cardiac or respiratory ARREST Do not perform Intubation, CPR, defibrillation or ACLS   In the event of cardiac or respiratory ARREST Use medication by any route, position, wound care, and other measures to relive pain and suffering. May use oxygen, suction and manual treatment of airway obstruction as needed for comfort.   Comments nurse may pronounce      12/20/16 2129    Code Status History    Date Active Date Inactive Code Status Order ID Comments User Context   12/12/2016  3:57 AM 12/19/2016  8:04 PM Full Code [id number]  Ivor Costa, MD ED   05/11/2015  2:13 PM 05/12/2015  4:10 PM Full Code [id number]  Bettey Costa, MD  Inpatient    Advance Directive Documentation     Most Recent Value  Type of Advance Directive  Healthcare Power of  Attorney  Pre-existing out of facility DNR order (yellow form or pink MOST form)  -  "MOST" Form in Place?  -      TOTAL TIME TAKING CARE OF THIS PATIENT: *40* minutes.    Ziah Leandro M.D on 12/23/2016 at 9:49 AM  Between 7am to 6pm - Pager - [phone] After 6pm go to [email or web address] - password [email or web address] [facility]  Office  [phone]  CC: Primary care physician; Keith Rake, MD

## 2016-12-26 ENCOUNTER — Encounter: Payer: Self-pay | Admitting: Family Medicine

## 2016-12-26 ENCOUNTER — Ambulatory Visit (INDEPENDENT_AMBULATORY_CARE_PROVIDER_SITE_OTHER): Payer: Medicare HMO

## 2016-12-26 ENCOUNTER — Other Ambulatory Visit: Payer: Self-pay | Admitting: *Deleted

## 2016-12-26 ENCOUNTER — Ambulatory Visit (INDEPENDENT_AMBULATORY_CARE_PROVIDER_SITE_OTHER): Payer: Medicare HMO | Admitting: Family Medicine

## 2016-12-26 VITALS — BP 118/50 | HR 92 | Temp 97.8°F | Ht 66.0 in | Wt 228.9 lb

## 2016-12-26 VITALS — BP 118/50 | HR 92 | Temp 97.8°F | Resp 17 | Ht 66.0 in | Wt 228.9 lb

## 2016-12-26 DIAGNOSIS — C342 Malignant neoplasm of middle lobe, bronchus or lung: Secondary | ICD-10-CM | POA: Diagnosis not present

## 2016-12-26 DIAGNOSIS — Z0000 Encounter for general adult medical examination without abnormal findings: Secondary | ICD-10-CM | POA: Diagnosis not present

## 2016-12-26 DIAGNOSIS — D62 Acute posthemorrhagic anemia: Secondary | ICD-10-CM | POA: Diagnosis not present

## 2016-12-26 DIAGNOSIS — I5023 Acute on chronic systolic (congestive) heart failure: Secondary | ICD-10-CM | POA: Diagnosis not present

## 2016-12-26 DIAGNOSIS — K922 Gastrointestinal hemorrhage, unspecified: Secondary | ICD-10-CM | POA: Diagnosis not present

## 2016-12-26 NOTE — Patient Instructions (Signed)
Mackenzie Park , Thank you for taking time to come for your Medicare Wellness Visit. I appreciate your ongoing commitment to your health goals. Please review the following plan we discussed and let me know if I can assist you in the future.   Screening recommendations/referrals: Colonoscopy: last done 01/06/14 Mammogram: last done 11/06/14, declined currently Bone Density: last done 11/06/14 Recommended yearly ophthalmology/optometry visit for glaucoma screening and checkup Recommended yearly dental visit for hygiene and checkup  Vaccinations: Influenza vaccine: done 09/12/16 Pneumococcal vaccine: completed series Tdap vaccine: last done 05/10/12 Shingles vaccine: declined    Advanced directives: on file  Next appointment: None   Preventive Care 73 Years and Older, Female Preventive care refers to lifestyle choices and visits with your health care provider that can promote health and wellness. What does preventive care include?  A yearly physical exam. This is also called an annual well check.  Dental exams once or twice a year.  Routine eye exams. Ask your health care provider how often you should have your eyes checked.  Personal lifestyle choices, including:  Daily care of your teeth and gums.  Regular physical activity.  Eating a healthy diet.  Avoiding tobacco and drug use.  Limiting alcohol use.  Practicing safe sex.  Taking low-dose aspirin every day.  Taking vitamin and mineral supplements as recommended by your health care provider. What happens during an annual well check? The services and screenings done by your health care provider during your annual well check will depend on your age, overall health, lifestyle risk factors, and family history of disease. Counseling  Your health care provider may ask you questions about your:  Alcohol use.  Tobacco use.  Drug use.  Emotional well-being.  Home and relationship well-being.  Sexual activity.  Eating  habits.  History of falls.  Memory and ability to understand (cognition).  Work and work Statistician.  Reproductive health. Screening  You may have the following tests or measurements:  Height, weight, and BMI.  Blood pressure.  Lipid and cholesterol levels. These may be checked every 5 years, or more frequently if you are over 74 years old.  Skin check.  Lung cancer screening. You may have this screening every year starting at age 51 if you have a 30-pack-year history of smoking and currently smoke or have quit within the past 15 years.  Fecal occult blood test (FOBT) of the stool. You may have this test every year starting at age 56.  Flexible sigmoidoscopy or colonoscopy. You may have a sigmoidoscopy every 5 years or a colonoscopy every 10 years starting at age 17.  Hepatitis C blood test.  Hepatitis B blood test.  Sexually transmitted disease (STD) testing.  Diabetes screening. This is done by checking your blood sugar (glucose) after you have not eaten for a while (fasting). You may have this done every 1-3 years.  Bone density scan. This is done to screen for osteoporosis. You may have this done starting at age 26.  Mammogram. This may be done every 1-2 years. Talk to your health care provider about how often you should have regular mammograms. Talk with your health care provider about your test results, treatment options, and if necessary, the need for more tests. Vaccines  Your health care provider may recommend certain vaccines, such as:  Influenza vaccine. This is recommended every year.  Tetanus, diphtheria, and acellular pertussis (Tdap, Td) vaccine. You may need a Td booster every 10 years.  Zoster vaccine. You may need this after  age 44.  Pneumococcal 13-valent conjugate (PCV13) vaccine. One dose is recommended after age 55.  Pneumococcal polysaccharide (PPSV23) vaccine. One dose is recommended after age 82. Talk to your health care provider about which  screenings and vaccines you need and how often you need them. This information is not intended to replace advice given to you by your health care provider. Make sure you discuss any questions you have with your health care provider. Document Released: 09/18/2015 Document Revised: 05/11/2016 Document Reviewed: 06/23/2015 Elsevier Interactive Patient Education  2017 [place] Prevention in the Home Falls can cause injuries. They can happen to people of all ages. There are many things you can do to make your home safe and to help prevent falls. What can I do on the outside of my home?  Regularly fix the edges of walkways and driveways and fix any cracks.  Remove anything that might make you trip as you walk through a door, such as a raised step or threshold.  Trim any bushes or trees on the path to your home.  Use bright outdoor lighting.  Clear any walking paths of anything that might make someone trip, such as rocks or tools.  Regularly check to see if handrails are loose or broken. Make sure that both sides of any steps have handrails.  Any raised decks and porches should have guardrails on the edges.  Have any leaves, snow, or ice cleared regularly.  Use sand or salt on walking paths during winter.  Clean up any spills in your garage right away. This includes oil or grease spills. What can I do in the bathroom?  Use night lights.  Install grab bars by the toilet and in the tub and shower. Do not use towel bars as grab bars.  Use non-skid mats or decals in the tub or shower.  If you need to sit down in the shower, use a plastic, non-slip stool.  Keep the floor dry. Clean up any water that spills on the floor as soon as it happens.  Remove soap buildup in the tub or shower regularly.  Attach bath mats securely with double-sided non-slip rug tape.  Do not have throw rugs and other things on the floor that can make you trip. What can I do in the bedroom?  Use  night lights.  Make sure that you have a light by your bed that is easy to reach.  Do not use any sheets or blankets that are too big for your bed. They should not hang down onto the floor.  Have a firm chair that has side arms. You can use this for support while you get dressed.  Do not have throw rugs and other things on the floor that can make you trip. What can I do in the kitchen?  Clean up any spills right away.  Avoid walking on wet floors.  Keep items that you use a lot in easy-to-reach places.  If you need to reach something above you, use a strong step stool that has a grab bar.  Keep electrical cords out of the way.  Do not use floor polish or wax that makes floors slippery. If you must use wax, use non-skid floor wax.  Do not have throw rugs and other things on the floor that can make you trip. What can I do with my stairs?  Do not leave any items on the stairs.  Make sure that there are handrails on both sides of the stairs  and use them. Fix handrails that are broken or loose. Make sure that handrails are as long as the stairways.  Check any carpeting to make sure that it is firmly attached to the stairs. Fix any carpet that is loose or worn.  Avoid having throw rugs at the top or bottom of the stairs. If you do have throw rugs, attach them to the floor with carpet tape.  Make sure that you have a light switch at the top of the stairs and the bottom of the stairs. If you do not have them, ask someone to add them for you. What else can I do to help prevent falls?  Wear shoes that:  Do not have high heels.  Have rubber bottoms.  Are comfortable and fit you well.  Are closed at the toe. Do not wear sandals.  If you use a stepladder:  Make sure that it is fully opened. Do not climb a closed stepladder.  Make sure that both sides of the stepladder are locked into place.  Ask someone to hold it for you, if possible.  Clearly mark and make sure that you  can see:  Any grab bars or handrails.  First and last steps.  Where the edge of each step is.  Use tools that help you move around (mobility aids) if they are needed. These include:  Canes.  Walkers.  Scooters.  Crutches.  Turn on the lights when you go into a dark area. Replace any light bulbs as soon as they burn out.  Set up your furniture so you have a clear path. Avoid moving your furniture around.  If any of your floors are uneven, fix them.  If there are any pets around you, be aware of where they are.  Review your medicines with your doctor. Some medicines can make you feel dizzy. This can increase your chance of falling. Ask your doctor what other things that you can do to help prevent falls. This information is not intended to replace advice given to you by your health care provider. Make sure you discuss any questions you have with your health care provider. Document Released: 06/18/2009 Document Revised: 01/28/2016 Document Reviewed: 09/26/2014 Elsevier Interactive Patient Education  2017 [employer].

## 2016-12-26 NOTE — Progress Notes (Signed)
Name: Mackenzie Park   MRN: [id number]    DOB: 1946/02/15   Date:12/26/2016       Progress Note  Subjective  Chief Complaint  Chief Complaint  Patient presents with  . Hospitalization Follow-up    HPI  Pt. Presents for hospital follow up, she has been seen at the ER and admitted twice in the past few weeks. SHe has been diagnosed with acute on chronic CHF, metastatic lung cancer with liver mets, UGIB with acute blood loss anemia.  Hospital discharge summary was reviewed and discussed with patient in detail.     Past Medical History:  Diagnosis Date  . Allergic rhinitis   . CHF (congestive heart failure) ([place])   . Depressive disorder   . Diabetes mellitus ([place])   . Hypercholesteremia   . Hypertension   . Lumbosacral neuritis   . Osteoarthritis   . Tumor liver     Past Surgical History:  Procedure Laterality Date  . 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 at [facility].   . CHOLECYSTECTOMY    . REPLACEMENT TOTAL KNEE BILATERAL    . TUBAL LIGATION      Family History  Problem Relation Age of Onset  . Dementia Father   . COPD Sister   . Breast cancer Sister   . Lung cancer Sister   . Lung cancer Mother   . Heart failure Brother   . Kidney failure Brother     Social History   Social History  . Marital status: Single    Spouse name: N/A  . Number of children: N/A  . Years of education: N/A   Occupational History  . Not on file.   Social History Main Topics  . Smoking status: Former Smoker    Quit date: 08/20/1995  . Smokeless tobacco: Never Used  . Alcohol use No  . Drug use: No  . Sexual activity: Not on file   Other Topics Concern  . 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   She is single. She has no children. She has a support system specifically with her niece.     Current Outpatient Prescriptions:  .  ACCU-CHEK AVIVA PLUS test strip, , Disp: ,  Rfl:  .  ACCU-CHEK SOFTCLIX LANCETS lancets, , Disp: , Rfl:  .  Blood Glucose Monitoring Suppl (ACCU-CHEK AVIVA PLUS) w/Device KIT, , Disp: , Rfl:  .  feeding supplement, GLUCERNA SHAKE, (GLUCERNA SHAKE) LIQD, Take 237 mLs by mouth 2 (two) times daily between meals., Disp: 30 Can, Rfl: 0 .  fluticasone (FLONASE) 50 MCG/ACT nasal spray, Place 1-2 sprays into both nostrils daily as needed for allergies. , Disp: , Rfl:  .  furosemide (LASIX) 20 MG tablet, Take 1 tablet (20 mg total) by mouth daily., Disp: 30 tablet, Rfl: 0 .  gabapentin (NEURONTIN) 300 MG capsule, Take 1 capsule (300 mg total) by mouth at bedtime., Disp: 90 capsule, Rfl: 0 .  insulin glargine (LANTUS) 100 UNIT/ML injection, Inject 0.15 mLs (15 Units total) into the skin at bedtime., Disp: 10 mL, Rfl: 11 .  iron polysaccharides (NIFEREX) 150 MG capsule, Take 1 capsule (150 mg total) by mouth daily., Disp: 30 capsule, Rfl: 0 .  levofloxacin (LEVAQUIN) 500 MG tablet, Take 1 tablet (500 mg total) by mouth daily., Disp: 3 tablet, Rfl: 0 .  lisinopril-hydrochlorothiazide (PRINZIDE,ZESTORETIC) 20-12.5 MG tablet, Take 1  tablet by mouth daily., Disp: 90 tablet, Rfl: 0 .  meloxicam (MOBIC) 15 MG tablet, , Disp: , Rfl:  .  metoprolol tartrate (LOPRESSOR) 25 MG tablet, Take 0.5 tablets (12.5 mg total) by mouth 2 (two) times daily., Disp: 60 tablet, Rfl: 0 .  Multiple Vitamin (MULTIVITAMIN WITH MINERALS) TABS tablet, Take 1 tablet by mouth daily., Disp: 30 tablet, Rfl: 0 .  pantoprazole (PROTONIX) 40 MG tablet, Take 1 tablet (40 mg total) by mouth 2 (two) times daily before a meal., Disp: 60 tablet, Rfl: 1 .  potassium chloride (K-DUR) 10 MEQ tablet, Take 1 tablet (10 mEq total) by mouth daily., Disp: 30 tablet, Rfl: 0 .  sitaGLIPtin (JANUVIA) 100 MG tablet, Take 1 tablet (100 mg total) by mouth daily., Disp: 270 tablet, Rfl: 0 .  sucralfate (CARAFATE) 1 GM/10ML suspension, Take 10 mLs (1 g total) by mouth 4 (four) times daily -  with meals and at  bedtime., Disp: 420 mL, Rfl: 0  Allergies  Allergen Reactions  . Sulfa Antibiotics Anaphylaxis and Hives  . Other Nausea And Vomiting    Milk products     Review of Systems  Constitutional: Positive for malaise/fatigue and weight loss. Negative for chills and fever.  Respiratory: Positive for cough. Negative for sputum production, shortness of breath and wheezing.   Cardiovascular: Positive for leg swelling. Negative for chest pain and palpitations.  Gastrointestinal: Negative for abdominal pain, blood in stool and melena (black colored stool).  Psychiatric/Behavioral: Negative for depression.     Objective  Vitals:   12/26/16 1424  BP: (!) 118/50  Pulse: 92  Resp: 17  Temp: 97.8 F (36.6 C)  TempSrc: Oral  SpO2: 94%  Weight: 228 lb 14.4 oz (103.8 kg)  Height: _0  (1.676 m)    Physical Exam  Constitutional: She is oriented to person, place, and time and well-developed, well-nourished, and in no distress.  HENT:  Head: Normocephalic and atraumatic.  Cardiovascular: Normal rate, regular rhythm and normal heart sounds.   No murmur heard. Pulmonary/Chest: Effort normal and breath sounds normal. She has no wheezes.  Abdominal: Soft. Bowel sounds are normal. There is no tenderness.  Musculoskeletal: She exhibits edema (3+ pitting edema).  Neurological: She is alert and oriented to person, place, and time.  Psychiatric: Mood, memory, affect and judgment normal.  Nursing note and vitals reviewed.      Assessment & Plan  1. Malignant neoplasm of middle lobe of right lung [facility]) Reviewed records and discussed in detail, she is to follow up with oncology to consider treatment options  2. Acute on chronic systolic congestive heart failure (HCC) On Lasix for diuresis,  3. UGIB (upper gastrointestinal bleed) Reviewed history and physical and discharge summaries, patient has received IV iron, follow-up with oncology to obtain CBC  4. Acute blood loss anemia As above,  repeat CBC and follow-up with oncology   Mackenzie Park Asad [facility] 12/26/2016 2:55 PM

## 2016-12-26 NOTE — Patient Outreach (Signed)
Transition of care call successful, following up on recent hospitalization 4/17-4/20 for healthcare associated pneumonia, Upper GI bleed, Type 2 DM with hyperglycemia.  Hx also includes but not limited-  Lung mass with suspected mets to liver (new), acute on chronic CHF, Hypertension. This RN CM was following pt for transition of care prior to recent hospitalization- 2 admits in 10 days.   Spoke with pt, HIPAA/identity verified.   Pt reports doing fine since discharge home, weak, took last antibiotic today.   Pt reports lives alone but 2 friends are with her, niece stays overnight.  Pt reports sugar today was 318, taking Januvia, insulin at night. Pt reports has been weighing daily,no sob walking,little swelling in feet/elevating.   Pt reports she has all of her medications,niece administers.  *Patient  was recently discharged from hospital and all medications have been reviewed. Pt  reports appetite getting better, eating greens,does not have Glucerna to which RN CM encouraged her to get.   [facility] reports [facility],set up services.  Pt reports she went for her Wellness check today, to see Cancer MD 4/25 and Dr. Manuella Ghazi 5/6.   RN CM discussed with pt doing a home visit this week to which pt agreed.   Plan:  As discussed with pt, plan to follow up again this week- initial home visit.    Zara Chess.   [facility]  [phone]

## 2016-12-26 NOTE — Progress Notes (Signed)
Subjective:   Mackenzie Park is a 71 y.o. female who presents for an Initial Medicare Annual Wellness Visit.  Review of Systems    N/A        Objective:    Today's Vitals   12/26/16 1256 12/26/16 1307  BP: (!) 118/50   Pulse: 92   Temp: 97.8 F (36.6 C)   TempSrc: Oral   Weight: 228 lb 14.4 oz (103.8 kg)   Height: '5\' 6"'$  (1.676 m)   PainSc: 0-No pain 0-No pain   Body mass index is 36.95 kg/m.   Current Medications (verified) Outpatient Encounter Prescriptions as of 12/26/2016  Medication Sig  . feeding supplement, GLUCERNA SHAKE, (GLUCERNA SHAKE) LIQD Take 237 mLs by mouth 2 (two) times daily between meals.  . fluticasone (FLONASE) 50 MCG/ACT nasal spray Place 1-2 sprays into both nostrils daily as needed for allergies.   . furosemide (LASIX) 20 MG tablet Take 1 tablet (20 mg total) by mouth daily.  Marland Kitchen gabapentin (NEURONTIN) 300 MG capsule Take 1 capsule (300 mg total) by mouth at bedtime.  . insulin glargine (LANTUS) 100 UNIT/ML injection Inject 0.15 mLs (15 Units total) into the skin at bedtime.  . iron polysaccharides (NIFEREX) 150 MG capsule Take 1 capsule (150 mg total) by mouth daily.  Marland Kitchen levofloxacin (LEVAQUIN) 500 MG tablet Take 1 tablet (500 mg total) by mouth daily.  Marland Kitchen lisinopril-hydrochlorothiazide (PRINZIDE,ZESTORETIC) 20-12.5 MG tablet Take 1 tablet by mouth daily.  . metoprolol tartrate (LOPRESSOR) 25 MG tablet Take 0.5 tablets (12.5 mg total) by mouth 2 (two) times daily.  . Multiple Vitamin (MULTIVITAMIN WITH MINERALS) TABS tablet Take 1 tablet by mouth daily.  . pantoprazole (PROTONIX) 40 MG tablet Take 1 tablet (40 mg total) by mouth 2 (two) times daily before a meal.  . potassium chloride (K-DUR) 10 MEQ tablet Take 1 tablet (10 mEq total) by mouth daily.  . sitaGLIPtin (JANUVIA) 100 MG tablet Take 1 tablet (100 mg total) by mouth daily.  . sucralfate (CARAFATE) 1 GM/10ML suspension Take 10 mLs (1 g total) by mouth 4 (four) times daily -  with meals and at  bedtime.   No facility-administered encounter medications on file as of 12/26/2016.     Allergies (verified) Sulfa antibiotics and Other   History: Past Medical History:  Diagnosis Date  . Allergic rhinitis   . CHF (congestive heart failure) ([place])   . Depressive disorder   . Diabetes mellitus ([place])   . Hypercholesteremia   . Hypertension   . Lumbosacral neuritis   . Osteoarthritis    Past Surgical History:  Procedure Laterality Date  . 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 at [facility].   . CHOLECYSTECTOMY    . REPLACEMENT TOTAL KNEE BILATERAL    . TUBAL LIGATION     Family History  Problem Relation Age of Onset  . Dementia Father   . COPD Sister   . Breast cancer Sister   . Lung cancer Sister   . Lung cancer Mother   . Heart failure Brother   . Kidney failure Brother    Social History   Occupational History  . Not on file.   Social History Main Topics  . Smoking status: Former Smoker    Quit date: 08/20/1995  . Smokeless tobacco: Never Used  . Alcohol use No  . Drug use: No  . Sexual activity: Not on file    Tobacco Counseling Counseling given: Not Answered   Activities of Daily Living  In your present state of health, do you have any difficulty performing the following activities: 12/21/2016 12/12/2016  Hearing? N N  Vision? N N  Difficulty concentrating or making decisions? N N  Walking or climbing stairs? Y N  Dressing or bathing? N N  Doing errands, shopping? N N  Some recent data might be hidden    Immunizations and Health Maintenance Immunization History  Administered Date(s) Administered  . Influenza, High Dose Seasonal PF 09/12/2016  . Influenza,inj,Quad PF,36+ Mos 05/14/2015  . Pneumococcal Conjugate-13 06/02/2014  . Pneumococcal Polysaccharide-23 04/04/2013  . Tdap 05/10/2012   Health Maintenance Due  Topic Date Due  . OPHTHALMOLOGY EXAM  03/30/1957  . MAMMOGRAM  06/08/2013    Patient Care Team: Roselee Nova, MD  as PCP - General (Family Medicine) Lyman Speller, RN as [place], LCSW as [facility] any recent [place] you may have received from other than [facility] providers in the past year (date may be approximate).     Assessment:   This is a routine wellness examination for Mackenzie Park.   Hearing/Vision screen Vision Screening Comments: Pt sees Dr Matilde Sprang for vision checks every 3 months.   Dietary issues and exercise activities discussed:    Goals    None     Depression Screen PHQ 2/9 Scores 11/08/2016 02/15/2016 06/18/2015 05/14/2015 02/05/2015  PHQ - 2 Score 0 0 0 0 0    Fall Risk Fall Risk  11/08/2016 02/15/2016 06/18/2015 05/14/2015 02/05/2015  Falls in the past year? No No No No No    Cognitive Function:        Screening Tests Health Maintenance  Topic Date Due  . OPHTHALMOLOGY EXAM  03/30/1957  . MAMMOGRAM  06/08/2013  . INFLUENZA VACCINE  04/05/2017  . HEMOGLOBIN A1C  05/11/2017  . FOOT EXAM  11/08/2017  . TETANUS/TDAP  05/10/2022  . COLONOSCOPY  01/07/2024  . DEXA SCAN  Completed  . Hepatitis C Screening  Completed  . PNA vac Low Risk Adult  Completed      Plan:  I have personally reviewed and addressed the Medicare Annual Wellness questionnaire and have noted the following in the patient's chart:  A. Medical and social history B. Use of alcohol, tobacco or illicit drugs  C. Current medications and supplements D. Functional ability and status E.  Nutritional status F.  Physical activity G. Advance directives H. List of other physicians I.  Hospitalizations, surgeries, and ER visits in previous 12 months J.  [place] such as hearing and vision if needed, cognitive and depression L. Referrals and appointments - none  In addition, I have reviewed and discussed with patient certain preventive protocols, quality metrics, and best practice recommendations. A written  personalized care plan for preventive services as well as general preventive health recommendations were provided to patient.  See attached scanned questionnaire for additional information.   Signed,  Fabio Neighbors, LPN Nurse Health Advisor   MD Recommendations: None, pt declined mammogram appointment today.  I, as supervising physician, have reviewed the nurse health advisor's Medicare Wellness Visit note for this patient and concur with the findings and recommendations listed above.  Signed Syed Asad A. Manuella Ghazi MD Attending Physician.

## 2016-12-27 ENCOUNTER — Telehealth: Payer: Self-pay | Admitting: Family Medicine

## 2016-12-27 ENCOUNTER — Other Ambulatory Visit: Payer: Self-pay | Admitting: Internal Medicine

## 2016-12-27 ENCOUNTER — Ambulatory Visit: Payer: Self-pay | Admitting: *Deleted

## 2016-12-27 DIAGNOSIS — I110 Hypertensive heart disease with heart failure: Secondary | ICD-10-CM | POA: Diagnosis not present

## 2016-12-27 DIAGNOSIS — I5033 Acute on chronic diastolic (congestive) heart failure: Secondary | ICD-10-CM | POA: Diagnosis not present

## 2016-12-27 DIAGNOSIS — D62 Acute posthemorrhagic anemia: Secondary | ICD-10-CM | POA: Insufficient documentation

## 2016-12-27 DIAGNOSIS — R160 Hepatomegaly, not elsewhere classified: Secondary | ICD-10-CM | POA: Diagnosis not present

## 2016-12-27 DIAGNOSIS — F329 Major depressive disorder, single episode, unspecified: Secondary | ICD-10-CM | POA: Diagnosis not present

## 2016-12-27 DIAGNOSIS — E1165 Type 2 diabetes mellitus with hyperglycemia: Secondary | ICD-10-CM | POA: Diagnosis not present

## 2016-12-27 DIAGNOSIS — E278 Other specified disorders of adrenal gland: Secondary | ICD-10-CM | POA: Diagnosis not present

## 2016-12-27 DIAGNOSIS — M5417 Radiculopathy, lumbosacral region: Secondary | ICD-10-CM | POA: Diagnosis not present

## 2016-12-27 DIAGNOSIS — D500 Iron deficiency anemia secondary to blood loss (chronic): Secondary | ICD-10-CM

## 2016-12-27 DIAGNOSIS — R918 Other nonspecific abnormal finding of lung field: Secondary | ICD-10-CM | POA: Diagnosis not present

## 2016-12-27 NOTE — Telephone Encounter (Signed)
Amy Pope from [place] called on behalf of the patient needing clarification on medication. Please return her call [phone]

## 2016-12-28 ENCOUNTER — Inpatient Hospital Stay: Payer: Medicare HMO

## 2016-12-28 ENCOUNTER — Encounter: Payer: Self-pay | Admitting: *Deleted

## 2016-12-28 ENCOUNTER — Inpatient Hospital Stay: Payer: Medicare HMO | Attending: Internal Medicine | Admitting: Internal Medicine

## 2016-12-28 ENCOUNTER — Encounter: Payer: Self-pay | Admitting: Internal Medicine

## 2016-12-28 DIAGNOSIS — R5383 Other fatigue: Secondary | ICD-10-CM | POA: Insufficient documentation

## 2016-12-28 DIAGNOSIS — D62 Acute posthemorrhagic anemia: Secondary | ICD-10-CM

## 2016-12-28 DIAGNOSIS — E119 Type 2 diabetes mellitus without complications: Secondary | ICD-10-CM | POA: Insufficient documentation

## 2016-12-28 DIAGNOSIS — D500 Iron deficiency anemia secondary to blood loss (chronic): Secondary | ICD-10-CM

## 2016-12-28 DIAGNOSIS — I10 Essential (primary) hypertension: Secondary | ICD-10-CM

## 2016-12-28 DIAGNOSIS — E7800 Pure hypercholesterolemia, unspecified: Secondary | ICD-10-CM | POA: Diagnosis not present

## 2016-12-28 DIAGNOSIS — D509 Iron deficiency anemia, unspecified: Secondary | ICD-10-CM

## 2016-12-28 DIAGNOSIS — M1990 Unspecified osteoarthritis, unspecified site: Secondary | ICD-10-CM | POA: Diagnosis not present

## 2016-12-28 DIAGNOSIS — Z794 Long term (current) use of insulin: Secondary | ICD-10-CM | POA: Diagnosis not present

## 2016-12-28 DIAGNOSIS — R531 Weakness: Secondary | ICD-10-CM | POA: Insufficient documentation

## 2016-12-28 DIAGNOSIS — F329 Major depressive disorder, single episode, unspecified: Secondary | ICD-10-CM | POA: Diagnosis not present

## 2016-12-28 DIAGNOSIS — Z79899 Other long term (current) drug therapy: Secondary | ICD-10-CM | POA: Insufficient documentation

## 2016-12-28 DIAGNOSIS — I5030 Unspecified diastolic (congestive) heart failure: Secondary | ICD-10-CM | POA: Diagnosis not present

## 2016-12-28 DIAGNOSIS — K769 Liver disease, unspecified: Secondary | ICD-10-CM | POA: Diagnosis not present

## 2016-12-28 DIAGNOSIS — Z803 Family history of malignant neoplasm of breast: Secondary | ICD-10-CM | POA: Diagnosis not present

## 2016-12-28 DIAGNOSIS — D72829 Elevated white blood cell count, unspecified: Secondary | ICD-10-CM | POA: Diagnosis not present

## 2016-12-28 LAB — CBC WITH DIFFERENTIAL/PLATELET
BASOS ABS: 0 10*3/uL (ref 0–0.1)
Basophils Relative: 0 %
Eosinophils Absolute: 0 10*3/uL (ref 0–0.7)
Eosinophils Relative: 0 %
HCT: 23.3 % — ABNORMAL LOW (ref 35.0–47.0)
Hemoglobin: 7.6 g/dL — ABNORMAL LOW (ref 12.0–16.0)
LYMPHS PCT: 3 %
Lymphs Abs: 0.6 10*3/uL — ABNORMAL LOW (ref 1.0–3.6)
MCH: 29.1 pg (ref 26.0–34.0)
MCHC: 32.6 g/dL (ref 32.0–36.0)
MCV: 89.5 fL (ref 80.0–100.0)
Monocytes Absolute: 0.8 10*3/uL (ref 0.2–0.9)
Monocytes Relative: 5 %
NEUTROS ABS: 16.3 10*3/uL — AB (ref 1.4–6.5)
NEUTROS PCT: 92 %
Platelets: 313 10*3/uL (ref 150–440)
RBC: 2.61 MIL/uL — AB (ref 3.80–5.20)
RDW: 18 % — ABNORMAL HIGH (ref 11.5–14.5)
WBC: 17.8 10*3/uL — AB (ref 3.6–11.0)

## 2016-12-28 MED ORDER — SODIUM CHLORIDE 0.9 % IV SOLN
Freq: Once | INTRAVENOUS | Status: AC
  Administered 2016-12-28: 11:00:00 via INTRAVENOUS
  Filled 2016-12-28: qty 1000

## 2016-12-28 MED ORDER — IRON SUCROSE 20 MG/ML IV SOLN
200.0000 mg | Freq: Once | INTRAVENOUS | Status: AC
Start: 2016-12-28 — End: 2016-12-28
  Administered 2016-12-28: 200 mg via INTRAVENOUS
  Filled 2016-12-28: qty 10

## 2016-12-28 MED ORDER — IRON SUCROSE 20 MG/ML IV SOLN: 200.0000 mg | Freq: Once | INTRAVENOUS | Status: DC

## 2016-12-28 NOTE — Progress Notes (Signed)
  Oncology Nurse Navigator Documentation  Navigator Location: [facility] (12/28/16 1000)   )Navigator Encounter Type: Follow-up Appt (12/28/16 1000)   Abnormal Finding Date: 12/16/16 (12/28/16 1000)                 Patient Visit Type: MedOnc (12/28/16 1000) Treatment Phase: Abnormal Scans (12/28/16 1000) Barriers/Navigation Needs: No barriers at this time;No Questions (12/28/16 1000)   Interventions: Referrals (12/28/16 1000) Referrals: Palliative Care (12/28/16 1000)          Acuity: Level 2 (12/28/16 1000)   Acuity Level 2: Initial guidance, education and coordination as needed;Assistance expediting appointments (12/28/16 1000)    Met with patient during hospital follow up with Dr. Rogue Bussing. Pt did not have any questions or concerns. Contact info given to patient and encouraged to call with any questions. Educated pt regarding palliative care and assisted patient with getting FMLA paperwork completed. FMLA paperwork was given to Lula Olszewski to complete.  Time Spent with Patient: 30 (12/28/16 1000)

## 2016-12-28 NOTE — Progress Notes (Signed)
Patient here for iron deficiency anemia.

## 2016-12-28 NOTE — Assessment & Plan Note (Addendum)
#   Severe IRON DEFICIENCY ANEMIA-hb ~6; likely Upper GIBleed [poor candidate for EGD- Dr.Anna; Jehovas witness; Declines PRBC transfusion]. Multiple IV iron infusions in the hospital. Today hemoglobin is 7.2/slightly better. Continue IV iron infusion weekly. Also add Aranesp-next visit.   # Multiple liver masses/ Right Middle lobe Lung mass-? Small cell vs others [poor candidate for biopsy]. Discussed the patient has most likely malignancy in the liver. However biopsy would be absolutely be important to confirm the diagnosis/and also help with prognosis. Patient understands given her medical issues she is a poor candidate for chemotherapy.   # Leukocytosis predominantly neutrophilia 17,000- likely reactive monitor for now.  # I had a long discussion the patient and family regarding the incurable nature of the disease; and would recommend evaluation by palliative Care on outpatient basis to establish the goals of care/ and subsequent transition to hospice if patient's clinical status continues to worsen.  # Patient will follow-up with me approximately 2 weeks with labs/ venofer; one week lab/Aranesp/venofer.  Discussed that EGD/biopsy would be performed if patient clinically improves/hemoglobin stabilizes. Paperwork file for disability/FMLA- patient had not think can go back to work given the severity of the illness.

## 2016-12-28 NOTE — Progress Notes (Signed)
[place] OFFICE PROGRESS NOTE  Patient Care Team: Roselee Nova, MD as PCP - General (Family Medicine) Lyman Speller, RN as [facility] Management Idelle Leech, [place] as Consulting Physician (Optometry) Cammie Sickle, MD as Consulting Physician (Internal Medicine)  Cancer Staging No matching staging information was found for the patient.    No history exists.    # Severe IRON DEFICIENCY ANEMIA-hb ~6; likely Upper GIBleed [ poor candidate for EGD- Dr.Anna; Jehovas witness; Declines PRBC transfusion]  # Multiple liver masses/ Right Middle lobe Lung mass-? Small cell vs others [poor candidate for biopsy]  # CHF diastolic [April 9417, [place]]  INTERVAL HISTORY:  Mackenzie Park 71 y.o.  female pleasant patient above history of Severe iron deficiency anemia- likely secondary to upper GI bleed; and also multiple liver lesions probable metastasis from the lung is here for follow-up.  In summary patient was recently admitted to the hospital for worsening shortness of breath black colored stools- hemoglobin trended to 6.63; however patient declined blood transfusions because of religious reasons. Patient received multiple IV iron transfusion. Also needed Aranesp 1. GI was consultative recommended EGD however because of severe anemia recommend conservative measures.  CT scan liver- multiple liver lesions; and also a lesion in the right middle lobe of the lung with atelectasis- highly suspicious for metastases from the lung. Again discussed the tumor conference- felt high risk for liver biopsy.  Patient is currently at home continues to feel weak/tired. She is on by mouth iron. She intermittently notices black stools. Otherwise appetite is fair. Complains of swelling in the legs. She gets around the house with a walker. She has friends to help her at home with her chores.  REVIEW OF SYSTEMS:  A complete 10 point review of system is done which is  negative except mentioned above/history of present illness.   PAST MEDICAL HISTORY :  Past Medical History:  Diagnosis Date  . Allergic rhinitis   . CHF (congestive heart failure) ([place])   . Depressive disorder   . Diabetes mellitus ([place])   . Hypercholesteremia   . Hypertension   . Lumbosacral neuritis   . Osteoarthritis   . Tumor liver     PAST SURGICAL HISTORY :   Past Surgical History:  Procedure Laterality Date  . 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 at [facility].   . CHOLECYSTECTOMY    . REPLACEMENT TOTAL KNEE BILATERAL    . TUBAL LIGATION      FAMILY HISTORY :   Family History  Problem Relation Age of Onset  . Dementia Father   . COPD Sister   . Breast cancer Sister   . Lung cancer Sister   . Lung cancer Mother   . Heart failure Brother   . Kidney failure Brother     SOCIAL HISTORY:   Social History  Substance Use Topics  . Smoking status: Former Smoker    Packs/day: 1.00    Years: 15.00    Types: Cigarettes    Quit date: 08/20/1995  . Smokeless tobacco: Never Used  . Alcohol use No    ALLERGIES:  is allergic to sulfa antibiotics and other.  MEDICATIONS:  Current Outpatient Prescriptions  Medication Sig Dispense Refill  . ACCU-CHEK AVIVA PLUS test strip     . ACCU-CHEK SOFTCLIX LANCETS lancets     . Blood Glucose Monitoring Suppl (ACCU-CHEK AVIVA PLUS) w/Device KIT     . feeding supplement, GLUCERNA  SHAKE, (GLUCERNA SHAKE) LIQD Take 237 mLs by mouth 2 (two) times daily between meals. 30 Can 0  . fluticasone (FLONASE) 50 MCG/ACT nasal spray Place 1-2 sprays into both nostrils daily as needed for allergies.     . furosemide (LASIX) 20 MG tablet Take 1 tablet (20 mg total) by mouth daily. 30 tablet 0  . gabapentin (NEURONTIN) 300 MG capsule Take 1 capsule (300 mg total) by mouth at bedtime. 90 capsule 0  . insulin glargine (LANTUS) 100 UNIT/ML injection Inject 0.15 mLs (15 Units total) into the skin at bedtime. 10 mL 11  . iron  polysaccharides (NIFEREX) 150 MG capsule Take 1 capsule (150 mg total) by mouth daily. 30 capsule 0  . meloxicam (MOBIC) 15 MG tablet     . metoprolol tartrate (LOPRESSOR) 25 MG tablet Take 0.5 tablets (12.5 mg total) by mouth 2 (two) times daily. 60 tablet 0  . Multiple Vitamin (MULTIVITAMIN WITH MINERALS) TABS tablet Take 1 tablet by mouth daily. 30 tablet 0  . pantoprazole (PROTONIX) 40 MG tablet Take 1 tablet (40 mg total) by mouth 2 (two) times daily before a meal. 60 tablet 1  . potassium chloride (K-DUR) 10 MEQ tablet Take 1 tablet (10 mEq total) by mouth daily. 30 tablet 0  . sitaGLIPtin (JANUVIA) 100 MG tablet Take 1 tablet (100 mg total) by mouth daily. 270 tablet 0  . sucralfate (CARAFATE) 1 GM/10ML suspension Take 10 mLs (1 g total) by mouth 4 (four) times daily -  with meals and at bedtime. 420 mL 0   No current facility-administered medications for this visit.     PHYSICAL EXAMINATION: ECOG PERFORMANCE STATUS: 3 - Symptomatic, >50% confined to bed  BP 108/61 (BP Location: Right Arm, Patient Position: Sitting)   Pulse 78   Temp 97.8 F (36.6 C) (Tympanic)   Resp 20   Ht 5' 6"  (1.676 m)   Wt 227 lb 14.4 oz (103.4 kg)   BMI 36.78 kg/m   Filed Weights   12/28/16 0937  Weight: 227 lb 14.4 oz (103.4 kg)    GENERAL: Well-nourished well-developed; Alert, no distress and comfortable.   Accompanied by a friend/family. Patient is a wheelchair. EYES: Positive for pallor.  OROPHARYNX: no thrush or ulceration; dentures. NECK: supple, no masses felt LYMPH:  no palpable lymphadenopathy in the cervical, axillary or inguinal regions LUNGS: clear to auscultation and  No wheeze or crackles HEART/CVS: regular rate & rhythm and no murmurs; 1+ bilateral lower extremity edema ABDOMEN:abdomen soft, non-tender and normal bowel sounds; positive for hepatomegaly. Musculoskeletal:no cyanosis of digits and no clubbing  PSYCH: alert & oriented x 3 with fluent speech NEURO: no focal  motor/sensory deficits SKIN:  no rashes or significant lesions  LABORATORY DATA:  I have reviewed the data as listed    Component Value Date/Time   NA 139 12/21/2016 0502   K 3.0 (L) 12/21/2016 0502   CL 100 (L) 12/21/2016 0502   CO2 33 (H) 12/21/2016 0502   GLUCOSE 288 (H) 12/21/2016 0502   BUN 45 (H) 12/21/2016 0502   CREATININE 0.87 12/21/2016 0502   CREATININE 0.66 11/16/2016 0955   CALCIUM 8.2 (L) 12/21/2016 0502   PROT 5.3 (L) 12/15/2016 0246   ALBUMIN 2.9 (L) 12/15/2016 0246   AST 142 (H) 12/15/2016 0246   ALT 237 (H) 12/15/2016 0246   ALKPHOS 89 12/15/2016 0246   BILITOT 0.8 12/15/2016 0246   GFRNONAA >60 12/21/2016 0502   GFRNONAA >89 11/16/2016 0955   GFRAA >60 12/21/2016  0502   GFRAA >89 11/16/2016 0955    No results found for: SPEP, UPEP  Lab Results  Component Value Date   WBC 17.8 (H) 12/28/2016   NEUTROABS 16.3 (H) 12/28/2016   HGB 7.6 (L) 12/28/2016   HCT 23.3 (L) 12/28/2016   MCV 89.5 12/28/2016   PLT 313 12/28/2016      Chemistry      Component Value Date/Time   NA 139 12/21/2016 0502   K 3.0 (L) 12/21/2016 0502   CL 100 (L) 12/21/2016 0502   CO2 33 (H) 12/21/2016 0502   BUN 45 (H) 12/21/2016 0502   CREATININE 0.87 12/21/2016 0502   CREATININE 0.66 11/16/2016 0955      Component Value Date/Time   CALCIUM 8.2 (L) 12/21/2016 0502   ALKPHOS 89 12/15/2016 0246   AST 142 (H) 12/15/2016 0246   ALT 237 (H) 12/15/2016 0246   BILITOT 0.8 12/15/2016 0246       RADIOGRAPHIC STUDIES: I have personally reviewed the radiological images as listed and agreed with the findings in the report. No results found.   ASSESSMENT & PLAN:  Iron deficiency anemia due to chronic blood loss # Severe IRON DEFICIENCY ANEMIA-hb ~6; likely Upper GIBleed [poor candidate for EGD- Dr.Anna; Jehovas witness; Declines PRBC transfusion]. Multiple IV iron infusions in the hospital. Today hemoglobin is 7.2/slightly better. Continue IV iron infusion weekly. Also add  Aranesp-next visit.   # Multiple liver masses/ Right Middle lobe Lung mass-? Small cell vs others [poor candidate for biopsy]. Discussed the patient has most likely malignancy in the liver. However biopsy would be absolutely be important to confirm the diagnosis/and also help with prognosis. Patient understands given her medical issues she is a poor candidate for chemotherapy.   # Leukocytosis predominantly neutrophilia 17,000- likely reactive monitor for now.  # I had a long discussion the patient and family regarding the incurable nature of the disease; and would recommend evaluation by palliative Care on outpatient basis to establish the goals of care/ and subsequent transition to hospice if patient's clinical status continues to worsen.  # Patient will follow-up with me approximately 2 weeks with labs/ venofer; one week lab/Aranesp/venofer.  Discussed that EGD/biopsy would be performed if patient clinically improves/hemoglobin stabilizes. Paperwork file for disability/FMLA- patient had not think can go back to work given the severity of the illness.   Orders Placed This Encounter  Procedures  . CBC with Differential    Standing Status:   Future    Standing Expiration Date:   12/28/2017  . CBC with Differential    Standing Status:   Future    Standing Expiration Date:   12/28/2017  . Comprehensive metabolic panel    Standing Status:   Future    Standing Expiration Date:   12/28/2017  . Lactate dehydrogenase    Standing Status:   Future    Standing Expiration Date:   12/28/2017   All questions were answered. The patient knows to call the clinic with any problems, questions or concerns.      Cammie Sickle, MD 12/28/2016 1:00 PM

## 2016-12-29 ENCOUNTER — Other Ambulatory Visit: Payer: Self-pay | Admitting: *Deleted

## 2016-12-29 ENCOUNTER — Ambulatory Visit: Payer: Medicare HMO | Admitting: Internal Medicine

## 2016-12-29 ENCOUNTER — Ambulatory Visit: Payer: Medicare HMO

## 2016-12-29 ENCOUNTER — Encounter: Payer: Self-pay | Admitting: *Deleted

## 2016-12-29 ENCOUNTER — Other Ambulatory Visit: Payer: Medicare HMO

## 2016-12-29 NOTE — Patient Outreach (Signed)
[facility]) Care Management   12/29/2016  CLYDE UPSHAW 1946/08/02 [id number]  Mackenzie Park is an 71 y.o. female  Subjective:  Pt reports on visit with cancer MD yesterday, informed hemoglobin went up to 7.2, Gave her iron, to follow up again in  2 weeks, told nothing else can do (comfort care).  Pt reports  Appetite not coming back, friends are preparing her meals, eating eggs/ smoothies  with greens,  Friend suppose to bring Glucerna, does feel better after eating.  Pt reports weak, home health  Suppose to be looking into getting her a Bedside commode.  Pt reports nobody checked her  sugar today, yesterday at night sugar was 218- coming down.  Pt reports taking all of her medications/night time insulin. Pt reports did not weigh today, niece been weighing her at night, has some edema in feet/can elevate foot of bed to help bring swelling down.      Objective:   Vitals:   12/29/16 1319  BP: 120/62  Pulse: 87  Resp: (!) 24    ROS  Physical Exam  Constitutional: She is oriented to person, place, and time. She appears well-developed and well-nourished.  Cardiovascular: Normal rate, regular rhythm and normal heart sounds.   Respiratory: Effort normal and breath sounds normal.  GI: Soft. Bowel sounds are normal.  Musculoskeletal: She exhibits edema.  +2 edema top of left foot, +1 top of right foot/bilateral ankles   Neurological: She is alert and oriented to person, place, and time.  Skin: Skin is warm and dry.  Psychiatric: She has a normal mood and affect. Her behavior is normal. Judgment and thought content normal.    Encounter Medications:   Outpatient Encounter Prescriptions as of 12/29/2016  Medication Sig Note  . ACCU-CHEK AVIVA PLUS test strip    . ACCU-CHEK SOFTCLIX LANCETS lancets    . Blood Glucose Monitoring Suppl (ACCU-CHEK AVIVA PLUS) w/Device KIT    . feeding supplement, GLUCERNA SHAKE, (GLUCERNA SHAKE) LIQD Take 237 mLs by mouth 2 (two) times  daily between meals. 12/26/2016: Niece to get.   . fluticasone (FLONASE) 50 MCG/ACT nasal spray Place 1-2 sprays into both nostrils daily as needed for allergies.  12/29/2016: As needed.   . furosemide (LASIX) 20 MG tablet Take 1 tablet (20 mg total) by mouth daily. 12/20/2016: Pt does not have any currently   . gabapentin (NEURONTIN) 300 MG capsule Take 1 capsule (300 mg total) by mouth at bedtime.   . insulin glargine (LANTUS) 100 UNIT/ML injection Inject 0.15 mLs (15 Units total) into the skin at bedtime.   . iron polysaccharides (NIFEREX) 150 MG capsule Take 1 capsule (150 mg total) by mouth daily.   . metoprolol tartrate (LOPRESSOR) 25 MG tablet Take 0.5 tablets (12.5 mg total) by mouth 2 (two) times daily. 12/20/2016: Pt does not have any currently   . Multiple Vitamin (MULTIVITAMIN WITH MINERALS) TABS tablet Take 1 tablet by mouth daily.   . pantoprazole (PROTONIX) 40 MG tablet Take 1 tablet (40 mg total) by mouth 2 (two) times daily before a meal.   . potassium chloride (K-DUR) 10 MEQ tablet Take 1 tablet (10 mEq total) by mouth daily. 12/20/2016: Pt does not have any currently   . sitaGLIPtin (JANUVIA) 100 MG tablet Take 1 tablet (100 mg total) by mouth daily.   . sucralfate (CARAFATE) 1 GM/10ML suspension Take 10 mLs (1 g total) by mouth 4 (four) times daily -  with meals and at bedtime.   Marland Kitchen  meloxicam (MOBIC) 15 MG tablet     No facility-administered encounter medications on file as of 12/29/2016.     Functional Status:   In your present state of health, do you have any difficulty performing the following activities: 12/29/2016 12/26/2016  Hearing? Y N  Vision? N N  Difficulty concentrating or making decisions? Tempie Donning  Walking or climbing stairs? Y Y  Dressing or bathing? Y Y  Doing errands, shopping? Tempie Donning  Preparing Food and eating ? Y -  Using the Toilet? N -  In the past six months, have you accidently leaked urine? N -  Do you have problems with loss of bowel control? N -  Managing  your Medications? Y -  Managing your Finances? Y -  Housekeeping or managing your Housekeeping? Y -  Some recent data might be hidden    Fall/Depression Screening:    PHQ 2/9 Scores 12/29/2016 12/26/2016 12/26/2016 12/26/2016 11/08/2016 02/15/2016 06/18/2015  PHQ - 2 Score 0 0 0 0 0 0 0    Assessment:  Pleasant 84 year old woman, resting in  bed upon arrival.  Friend present during home  visit, assists with meals/medication.   Weak, needed assistance to sit up on side of bed to eat.  CHF:  Edema noted top of both feet/ankles.  Discussed with pt weighing same time in am/record/    call MD for weight gain of >2-3 lbs in a day, 5 lbs in a week- pt voiced understanding.  DM: sugar checked  during home visit, result 324 (after eating small amount of greens). Per pt- sugar    last night 218. Review of glucometer- yesterday in am 292,  4/23- 429.     Plan:  Plan to continue to follow pt for transition of care, follow up again next week telephonically.            Pt to follow up with Dr. Manuella Ghazi 5/4.            Plan to send Dr. Manuella Ghazi by in basket 4/26 home visit encounter.    [facility] CM Care Plan Problem Two     Most Recent Value  Care Plan Problem Two  Risk for readmission related to recent hospitalization - 2 admits in 10 days 0 hyperglycemia, PNA, HF, new dx of cancer/mets to liver   Role Documenting the Problem Two  Care Management Coordinator  Care Plan for Problem Two  Active  Interventions for Problem Two Long Term Goal   Initial home visit done, reviewed recent visit with Cancer MD   [facility] Long Term Goal (31-90) days  Pt would not readmit to the hospital in the next 31 days   [facility] Long Term Goal Start Date  12/26/16  [facility] CM Short Term Goal #1 (0-30 days)  Pt would take medicaitons as ordered for the next 30 days   [facility] CM Short Term Goal #1 Start Date  12/26/16  Interventions for Short Term Goal #2   Reviewed with pt medication changes, importance of adherence   [facility] CM Short Term Goal #2 (0-30 days)   Pt would see blood sugars decrease in the next 30 days   [facility] CM Short Term Goal #2 Start Date  12/26/16  Interventions for Short Term Goal #2  Emmi information on Hyperglycemia provided/reviewed - signs of hyperglycemia, call MD if sugars stay elevated.      Zara Chess.   [place] Care Management  [phone]

## 2016-12-30 DIAGNOSIS — F329 Major depressive disorder, single episode, unspecified: Secondary | ICD-10-CM | POA: Diagnosis not present

## 2016-12-30 DIAGNOSIS — I5033 Acute on chronic diastolic (congestive) heart failure: Secondary | ICD-10-CM | POA: Diagnosis not present

## 2016-12-30 DIAGNOSIS — R160 Hepatomegaly, not elsewhere classified: Secondary | ICD-10-CM | POA: Diagnosis not present

## 2016-12-30 DIAGNOSIS — E1165 Type 2 diabetes mellitus with hyperglycemia: Secondary | ICD-10-CM | POA: Diagnosis not present

## 2016-12-30 DIAGNOSIS — R918 Other nonspecific abnormal finding of lung field: Secondary | ICD-10-CM | POA: Diagnosis not present

## 2016-12-30 DIAGNOSIS — I110 Hypertensive heart disease with heart failure: Secondary | ICD-10-CM | POA: Diagnosis not present

## 2016-12-30 DIAGNOSIS — E278 Other specified disorders of adrenal gland: Secondary | ICD-10-CM | POA: Diagnosis not present

## 2016-12-30 DIAGNOSIS — M5417 Radiculopathy, lumbosacral region: Secondary | ICD-10-CM | POA: Diagnosis not present

## 2016-12-30 DIAGNOSIS — D62 Acute posthemorrhagic anemia: Secondary | ICD-10-CM | POA: Diagnosis not present

## 2017-01-02 DIAGNOSIS — D62 Acute posthemorrhagic anemia: Secondary | ICD-10-CM | POA: Diagnosis not present

## 2017-01-02 DIAGNOSIS — E278 Other specified disorders of adrenal gland: Secondary | ICD-10-CM | POA: Diagnosis not present

## 2017-01-02 DIAGNOSIS — I5033 Acute on chronic diastolic (congestive) heart failure: Secondary | ICD-10-CM | POA: Diagnosis not present

## 2017-01-02 DIAGNOSIS — R160 Hepatomegaly, not elsewhere classified: Secondary | ICD-10-CM | POA: Diagnosis not present

## 2017-01-02 DIAGNOSIS — I110 Hypertensive heart disease with heart failure: Secondary | ICD-10-CM | POA: Diagnosis not present

## 2017-01-02 DIAGNOSIS — R918 Other nonspecific abnormal finding of lung field: Secondary | ICD-10-CM | POA: Diagnosis not present

## 2017-01-02 DIAGNOSIS — E1165 Type 2 diabetes mellitus with hyperglycemia: Secondary | ICD-10-CM | POA: Diagnosis not present

## 2017-01-02 DIAGNOSIS — F329 Major depressive disorder, single episode, unspecified: Secondary | ICD-10-CM | POA: Diagnosis not present

## 2017-01-02 DIAGNOSIS — M5417 Radiculopathy, lumbosacral region: Secondary | ICD-10-CM | POA: Diagnosis not present

## 2017-01-03 ENCOUNTER — Encounter: Payer: Self-pay | Admitting: Oncology

## 2017-01-03 ENCOUNTER — Telehealth: Payer: Self-pay | Admitting: Family Medicine

## 2017-01-03 DIAGNOSIS — F329 Major depressive disorder, single episode, unspecified: Secondary | ICD-10-CM | POA: Diagnosis not present

## 2017-01-03 DIAGNOSIS — E1165 Type 2 diabetes mellitus with hyperglycemia: Secondary | ICD-10-CM | POA: Diagnosis not present

## 2017-01-03 DIAGNOSIS — I110 Hypertensive heart disease with heart failure: Secondary | ICD-10-CM | POA: Diagnosis not present

## 2017-01-03 DIAGNOSIS — D62 Acute posthemorrhagic anemia: Secondary | ICD-10-CM | POA: Diagnosis not present

## 2017-01-03 DIAGNOSIS — R160 Hepatomegaly, not elsewhere classified: Secondary | ICD-10-CM | POA: Diagnosis not present

## 2017-01-03 DIAGNOSIS — I5033 Acute on chronic diastolic (congestive) heart failure: Secondary | ICD-10-CM | POA: Diagnosis not present

## 2017-01-03 DIAGNOSIS — E278 Other specified disorders of adrenal gland: Secondary | ICD-10-CM | POA: Diagnosis not present

## 2017-01-03 DIAGNOSIS — R918 Other nonspecific abnormal finding of lung field: Secondary | ICD-10-CM | POA: Diagnosis not present

## 2017-01-03 DIAGNOSIS — M5417 Radiculopathy, lumbosacral region: Secondary | ICD-10-CM | POA: Diagnosis not present

## 2017-01-03 NOTE — Telephone Encounter (Signed)
She should return for office visit so we can document the medical necessity for the above-mentioned equipment

## 2017-01-03 NOTE — Telephone Encounter (Signed)
Merry Proud from [facility] called on behalf of the patient to get pt a manuel wheel chair and bed side commode. Merry Proud would like to have visit notes to support this and a doctors order to be faxed to [phone]. Pt needs this done right away if possible. Pt requires physical assistance for walking and she is limited to mobility for now. Pt does have a wide wheelchair however it  does not fit through her doors and is unsafe for her to use. Please call Merry Proud back at [phone].

## 2017-01-03 NOTE — Telephone Encounter (Signed)
Pt has to call back with an appt date.

## 2017-01-04 ENCOUNTER — Ambulatory Visit (INDEPENDENT_AMBULATORY_CARE_PROVIDER_SITE_OTHER): Payer: Medicare HMO | Admitting: Family Medicine

## 2017-01-04 ENCOUNTER — Inpatient Hospital Stay: Payer: Medicare HMO

## 2017-01-04 ENCOUNTER — Inpatient Hospital Stay: Payer: Medicare HMO | Attending: Internal Medicine

## 2017-01-04 ENCOUNTER — Encounter: Payer: Self-pay | Admitting: Family Medicine

## 2017-01-04 VITALS — BP 113/71

## 2017-01-04 VITALS — BP 120/68 | HR 94 | Temp 97.7°F | Resp 16

## 2017-01-04 DIAGNOSIS — Z7409 Other reduced mobility: Secondary | ICD-10-CM

## 2017-01-04 DIAGNOSIS — I110 Hypertensive heart disease with heart failure: Secondary | ICD-10-CM | POA: Insufficient documentation

## 2017-01-04 DIAGNOSIS — D500 Iron deficiency anemia secondary to blood loss (chronic): Secondary | ICD-10-CM | POA: Diagnosis not present

## 2017-01-04 DIAGNOSIS — K769 Liver disease, unspecified: Secondary | ICD-10-CM | POA: Insufficient documentation

## 2017-01-04 DIAGNOSIS — R918 Other nonspecific abnormal finding of lung field: Secondary | ICD-10-CM | POA: Insufficient documentation

## 2017-01-04 DIAGNOSIS — Z794 Long term (current) use of insulin: Secondary | ICD-10-CM | POA: Insufficient documentation

## 2017-01-04 DIAGNOSIS — Z9884 Bariatric surgery status: Secondary | ICD-10-CM | POA: Insufficient documentation

## 2017-01-04 DIAGNOSIS — R5383 Other fatigue: Secondary | ICD-10-CM | POA: Diagnosis not present

## 2017-01-04 DIAGNOSIS — R05 Cough: Secondary | ICD-10-CM | POA: Insufficient documentation

## 2017-01-04 DIAGNOSIS — Z9049 Acquired absence of other specified parts of digestive tract: Secondary | ICD-10-CM | POA: Insufficient documentation

## 2017-01-04 DIAGNOSIS — Z79899 Other long term (current) drug therapy: Secondary | ICD-10-CM | POA: Insufficient documentation

## 2017-01-04 DIAGNOSIS — R011 Cardiac murmur, unspecified: Secondary | ICD-10-CM

## 2017-01-04 DIAGNOSIS — R110 Nausea: Secondary | ICD-10-CM | POA: Diagnosis not present

## 2017-01-04 DIAGNOSIS — Z801 Family history of malignant neoplasm of trachea, bronchus and lung: Secondary | ICD-10-CM | POA: Insufficient documentation

## 2017-01-04 DIAGNOSIS — I959 Hypotension, unspecified: Secondary | ICD-10-CM | POA: Diagnosis not present

## 2017-01-04 DIAGNOSIS — F329 Major depressive disorder, single episode, unspecified: Secondary | ICD-10-CM | POA: Insufficient documentation

## 2017-01-04 DIAGNOSIS — E119 Type 2 diabetes mellitus without complications: Secondary | ICD-10-CM | POA: Insufficient documentation

## 2017-01-04 DIAGNOSIS — R6889 Other general symptoms and signs: Secondary | ICD-10-CM

## 2017-01-04 DIAGNOSIS — R1011 Right upper quadrant pain: Secondary | ICD-10-CM | POA: Diagnosis not present

## 2017-01-04 DIAGNOSIS — Z87891 Personal history of nicotine dependence: Secondary | ICD-10-CM | POA: Insufficient documentation

## 2017-01-04 DIAGNOSIS — Z803 Family history of malignant neoplasm of breast: Secondary | ICD-10-CM | POA: Insufficient documentation

## 2017-01-04 DIAGNOSIS — R0602 Shortness of breath: Secondary | ICD-10-CM | POA: Insufficient documentation

## 2017-01-04 DIAGNOSIS — M5417 Radiculopathy, lumbosacral region: Secondary | ICD-10-CM | POA: Insufficient documentation

## 2017-01-04 DIAGNOSIS — N179 Acute kidney failure, unspecified: Secondary | ICD-10-CM | POA: Diagnosis not present

## 2017-01-04 DIAGNOSIS — M1990 Unspecified osteoarthritis, unspecified site: Secondary | ICD-10-CM | POA: Insufficient documentation

## 2017-01-04 DIAGNOSIS — E7800 Pure hypercholesterolemia, unspecified: Secondary | ICD-10-CM | POA: Insufficient documentation

## 2017-01-04 DIAGNOSIS — D62 Acute posthemorrhagic anemia: Secondary | ICD-10-CM

## 2017-01-04 DIAGNOSIS — M7989 Other specified soft tissue disorders: Secondary | ICD-10-CM | POA: Insufficient documentation

## 2017-01-04 DIAGNOSIS — I509 Heart failure, unspecified: Secondary | ICD-10-CM | POA: Insufficient documentation

## 2017-01-04 LAB — CBC WITH DIFFERENTIAL/PLATELET
BASOS PCT: 1 %
Basophils Absolute: 0.2 10*3/uL — ABNORMAL HIGH (ref 0–0.1)
EOS PCT: 0 %
Eosinophils Absolute: 0 10*3/uL (ref 0–0.7)
HEMATOCRIT: 26.7 % — AB (ref 35.0–47.0)
Hemoglobin: 8.7 g/dL — ABNORMAL LOW (ref 12.0–16.0)
Lymphocytes Relative: 3 %
Lymphs Abs: 0.4 10*3/uL — ABNORMAL LOW (ref 1.0–3.6)
MCH: 29.8 pg (ref 26.0–34.0)
MCHC: 32.6 g/dL (ref 32.0–36.0)
MCV: 91.4 fL (ref 80.0–100.0)
MONO ABS: 0.7 10*3/uL (ref 0.2–0.9)
Monocytes Relative: 5 %
NEUTROS ABS: 13.1 10*3/uL — AB (ref 1.4–6.5)
Neutrophils Relative %: 91 %
Platelets: 205 10*3/uL (ref 150–440)
RBC: 2.92 MIL/uL — ABNORMAL LOW (ref 3.80–5.20)
RDW: 22.7 % — AB (ref 11.5–14.5)
WBC: 14.4 10*3/uL — ABNORMAL HIGH (ref 3.6–11.0)

## 2017-01-04 LAB — COMPREHENSIVE METABOLIC PANEL
ALBUMIN: 3 g/dL — AB (ref 3.5–5.0)
ALT: 112 U/L — ABNORMAL HIGH (ref 14–54)
AST: 89 U/L — AB (ref 15–41)
Alkaline Phosphatase: 145 U/L — ABNORMAL HIGH (ref 38–126)
Anion gap: 10 (ref 5–15)
BUN: 20 mg/dL (ref 6–20)
CO2: 33 mmol/L — ABNORMAL HIGH (ref 22–32)
CREATININE: 0.78 mg/dL (ref 0.44–1.00)
Calcium: 8.6 mg/dL — ABNORMAL LOW (ref 8.9–10.3)
Chloride: 91 mmol/L — ABNORMAL LOW (ref 101–111)
GFR calc Af Amer: >60 mL/min (ref >60)
GFR calc non Af Amer: >60 mL/min (ref >60)
GLUCOSE: 251 mg/dL — AB (ref 65–99)
POTASSIUM: 3 mmol/L — AB (ref 3.5–5.1)
SODIUM: 134 mmol/L — AB (ref 135–145)
TOTAL PROTEIN: 5.9 g/dL — AB (ref 6.5–8.1)
Total Bilirubin: 2 mg/dL — ABNORMAL HIGH (ref 0.3–1.2)

## 2017-01-04 LAB — LACTATE DEHYDROGENASE: LDH: 823 U/L — ABNORMAL HIGH (ref 98–192)

## 2017-01-04 MED ORDER — DARBEPOETIN ALFA 300 MCG/0.6ML IJ SOSY
300.0000 ug | PREFILLED_SYRINGE | Freq: Once | INTRAMUSCULAR | Status: AC
  Administered 2017-01-04: 300 ug via SUBCUTANEOUS
  Filled 2017-01-04: qty 0.6

## 2017-01-04 NOTE — Progress Notes (Signed)
Name: Mackenzie Park   MRN: [id number]    DOB: 1946/05/23   Date:01/04/2017       Progress Note  Subjective  Chief Complaint  Chief Complaint  Patient presents with  . Follow-up    Discuss wheelchair and bedside commode    HPI  Patient presents to obtain DME including manual wheel chair and bedside commode along with a toilet seat. Patient has recently been diagnosed with metastatic lung cancer with concurrent anemia along with congestive heart failure. She has very limited mobility due to weakness and fatigue related to the cancer. She has used her brother's wheelchair at home however that is too big to easily pass through her doors. Similarly, she needs a bedside commode with toilet seat to help her lift herself off after using the toilet.    Past Medical History:  Diagnosis Date  . Allergic rhinitis   . Cancer ([place])   . CHF (congestive heart failure) ([place])   . Depressive disorder   . Diabetes mellitus ([place])   . Hypercholesteremia   . Hypertension   . Lumbosacral neuritis   . Osteoarthritis   . Tumor liver     Past Surgical History:  Procedure Laterality Date  . 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 at [facility].   . CHOLECYSTECTOMY    . REPLACEMENT TOTAL KNEE BILATERAL    . TUBAL LIGATION      Family History  Problem Relation Age of Onset  . Dementia Father   . COPD Sister   . Breast cancer Sister   . Lung cancer Sister   . Lung cancer Mother   . Breast cancer Mother   . Heart failure Brother   . Kidney failure Brother     Social History   Social History  . Marital status: Single    Spouse name: N/A  . Number of children: N/A  . Years of education: N/A   Occupational History  . Not on file.   Social History Main Topics  . Smoking status: Former Smoker    Packs/day: 1.00    Years: 15.00    Types: Cigarettes    Quit date: 08/20/1995  . Smokeless tobacco: Never Used  . Alcohol use No  . Drug use: No  . Sexual activity: Not on file    Other Topics Concern  . 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   She is single. She has no children. She has a support system specifically with her niece.     Current Outpatient Prescriptions:  .  ACCU-CHEK AVIVA PLUS test strip, , Disp: , Rfl:  .  ACCU-CHEK SOFTCLIX LANCETS lancets, , Disp: , Rfl:  .  Blood Glucose Monitoring Suppl (ACCU-CHEK AVIVA PLUS) w/Device KIT, , Disp: , Rfl:  .  feeding supplement, GLUCERNA SHAKE, (GLUCERNA SHAKE) LIQD, Take 237 mLs by mouth 2 (two) times daily between meals., Disp: 30 Can, Rfl: 0 .  fluticasone (FLONASE) 50 MCG/ACT nasal spray, Place 1-2 sprays into both nostrils daily as needed for allergies. , Disp: , Rfl:  .  furosemide (LASIX) 20 MG tablet, Take 1 tablet (20 mg total) by mouth daily., Disp: 30 tablet, Rfl: 0 .  gabapentin (NEURONTIN) 300 MG capsule, Take 1 capsule (300 mg total) by mouth at bedtime., Disp: 90 capsule, Rfl: 0 .  insulin glargine (LANTUS) 100 UNIT/ML injection, Inject 0.15 mLs (15  Units total) into the skin at bedtime., Disp: 10 mL, Rfl: 11 .  iron polysaccharides (NIFEREX) 150 MG capsule, Take 1 capsule (150 mg total) by mouth daily., Disp: 30 capsule, Rfl: 0 .  meloxicam (MOBIC) 15 MG tablet, , Disp: , Rfl:  .  metoprolol tartrate (LOPRESSOR) 25 MG tablet, Take 0.5 tablets (12.5 mg total) by mouth 2 (two) times daily., Disp: 60 tablet, Rfl: 0 .  Multiple Vitamin (MULTIVITAMIN WITH MINERALS) TABS tablet, Take 1 tablet by mouth daily., Disp: 30 tablet, Rfl: 0 .  pantoprazole (PROTONIX) 40 MG tablet, Take 1 tablet (40 mg total) by mouth 2 (two) times daily before a meal., Disp: 60 tablet, Rfl: 1 .  potassium chloride (K-DUR) 10 MEQ tablet, Take 1 tablet (10 mEq total) by mouth daily., Disp: 30 tablet, Rfl: 0 .  sitaGLIPtin (JANUVIA) 100 MG tablet, Take 1 tablet (100 mg total) by mouth daily., Disp: 270 tablet, Rfl: 0 .  sucralfate (CARAFATE)  1 GM/10ML suspension, Take 10 mLs (1 g total) by mouth 4 (four) times daily -  with meals and at bedtime., Disp: 420 mL, Rfl: 0  Allergies  Allergen Reactions  . Sulfa Antibiotics Anaphylaxis and Hives  . Other Nausea And Vomiting    Milk products     ROS  Please see history of present illness for complete discussion of ROS  Objective  Vitals:   01/04/17 1506  BP: 120/68  Pulse: 94  Resp: 16  Temp: 97.7 F (36.5 C)  TempSrc: Oral  SpO2: 96%    Physical Exam  Constitutional: She is well-developed, well-nourished, and in no distress. Vital signs are normal.  Weak appearing elderly female in wheelchair  Cardiovascular: Normal rate and regular rhythm.   Murmur heard.  Systolic murmur is present with a grade of 2/6  Pulmonary/Chest: Breath sounds normal. She has no rhonchi.  Musculoskeletal:       Right ankle: She exhibits swelling.       Left ankle: She exhibits swelling.  3+ pitting edema bilaterally.  Nursing note and vitals reviewed.     Assessment & Plan  1. Decreased ambulation status Provided prescription for bedside commode, toilet seat, shower chair and manual wheelchair  2. Heart murmur  - Ambulatory referral to Cardiology   [facility] 01/04/2017 3:24 PM

## 2017-01-05 ENCOUNTER — Inpatient Hospital Stay: Payer: Medicare HMO

## 2017-01-05 ENCOUNTER — Other Ambulatory Visit: Payer: Self-pay | Admitting: *Deleted

## 2017-01-05 VITALS — BP 124/71 | HR 93 | Temp 98.5°F | Resp 18

## 2017-01-05 DIAGNOSIS — K769 Liver disease, unspecified: Secondary | ICD-10-CM | POA: Diagnosis not present

## 2017-01-05 DIAGNOSIS — N179 Acute kidney failure, unspecified: Secondary | ICD-10-CM | POA: Diagnosis not present

## 2017-01-05 DIAGNOSIS — R5383 Other fatigue: Secondary | ICD-10-CM | POA: Diagnosis not present

## 2017-01-05 DIAGNOSIS — D500 Iron deficiency anemia secondary to blood loss (chronic): Secondary | ICD-10-CM | POA: Diagnosis not present

## 2017-01-05 DIAGNOSIS — Z79899 Other long term (current) drug therapy: Secondary | ICD-10-CM | POA: Diagnosis not present

## 2017-01-05 DIAGNOSIS — I5033 Acute on chronic diastolic (congestive) heart failure: Secondary | ICD-10-CM | POA: Diagnosis not present

## 2017-01-05 DIAGNOSIS — R918 Other nonspecific abnormal finding of lung field: Secondary | ICD-10-CM | POA: Diagnosis not present

## 2017-01-05 DIAGNOSIS — R160 Hepatomegaly, not elsewhere classified: Secondary | ICD-10-CM | POA: Diagnosis not present

## 2017-01-05 DIAGNOSIS — E278 Other specified disorders of adrenal gland: Secondary | ICD-10-CM | POA: Diagnosis not present

## 2017-01-05 DIAGNOSIS — E1165 Type 2 diabetes mellitus with hyperglycemia: Secondary | ICD-10-CM | POA: Diagnosis not present

## 2017-01-05 DIAGNOSIS — R1011 Right upper quadrant pain: Secondary | ICD-10-CM | POA: Diagnosis not present

## 2017-01-05 DIAGNOSIS — I110 Hypertensive heart disease with heart failure: Secondary | ICD-10-CM | POA: Diagnosis not present

## 2017-01-05 DIAGNOSIS — M5417 Radiculopathy, lumbosacral region: Secondary | ICD-10-CM | POA: Diagnosis not present

## 2017-01-05 DIAGNOSIS — D62 Acute posthemorrhagic anemia: Secondary | ICD-10-CM

## 2017-01-05 DIAGNOSIS — R110 Nausea: Secondary | ICD-10-CM | POA: Diagnosis not present

## 2017-01-05 DIAGNOSIS — F329 Major depressive disorder, single episode, unspecified: Secondary | ICD-10-CM | POA: Diagnosis not present

## 2017-01-05 DIAGNOSIS — I959 Hypotension, unspecified: Secondary | ICD-10-CM | POA: Diagnosis not present

## 2017-01-05 MED ORDER — POTASSIUM CHLORIDE CRYS ER 20 MEQ PO TBCR: 20.0000 meq | EXTENDED_RELEASE_TABLET | Freq: Two times a day (BID) | ORAL | 0 refills | Status: AC

## 2017-01-05 MED ORDER — SODIUM CHLORIDE 0.9 % IV SOLN: 200.0000 mg | Freq: Once | INTRAVENOUS | Status: DC

## 2017-01-05 MED ORDER — SODIUM CHLORIDE 0.9 % IV SOLN
Freq: Once | INTRAVENOUS | Status: AC
  Administered 2017-01-05: 15:00:00 via INTRAVENOUS
  Filled 2017-01-05: qty 1000

## 2017-01-05 MED ORDER — IRON SUCROSE 20 MG/ML IV SOLN
200.0000 mg | Freq: Once | INTRAVENOUS | Status: AC
  Administered 2017-01-05: 200 mg via INTRAVENOUS
  Filled 2017-01-05: qty 10

## 2017-01-05 NOTE — Patient Outreach (Signed)
Transition of care call successful, ongoing follow up on recent hospitalization 4/17- 4/20 for healthcare associated pneumonia, Upper GI bleed, Type 2 DM with hyperglycemia.  Pt had 2 admits within 30 days.  Spoke with pt, HIPAA verified.  Pt reports sugars are fine, 204 today, yesterday 201, weights down.   Pt reports on visit with Dr. Manuella Ghazi yesterday, no insulin changes, labs done, received a call today to increase potassium.  Pt reports HH PT is with her now, working on getting wheelchair, bedside commode, toilet seat.   RN CM requested of pt recent weights, sugar ranges to which requested to talk to [facility] PT.  Merry Proud reports view of pt's recordings of  sugars/weights- sugars range 200-267, weights range 222-223 lbs.  Merry Proud reports working on getting pt's wheelchair, bedside commode, toilet, have MD order but need MD notes. Had pt come back on phone- pt reports eating much better.     Plan:  As discussed with pt, plan to follow up again next week telephonically(part of ongoing transition of care).  Zara Chess.   [place] Care Management  [phone]

## 2017-01-06 ENCOUNTER — Ambulatory Visit: Payer: Medicare HMO | Admitting: Family Medicine

## 2017-01-09 DIAGNOSIS — R918 Other nonspecific abnormal finding of lung field: Secondary | ICD-10-CM | POA: Diagnosis not present

## 2017-01-09 DIAGNOSIS — I5033 Acute on chronic diastolic (congestive) heart failure: Secondary | ICD-10-CM | POA: Diagnosis not present

## 2017-01-09 DIAGNOSIS — D500 Iron deficiency anemia secondary to blood loss (chronic): Secondary | ICD-10-CM | POA: Diagnosis not present

## 2017-01-09 DIAGNOSIS — E1165 Type 2 diabetes mellitus with hyperglycemia: Secondary | ICD-10-CM | POA: Diagnosis not present

## 2017-01-09 DIAGNOSIS — M5417 Radiculopathy, lumbosacral region: Secondary | ICD-10-CM | POA: Diagnosis not present

## 2017-01-09 DIAGNOSIS — F329 Major depressive disorder, single episode, unspecified: Secondary | ICD-10-CM | POA: Diagnosis not present

## 2017-01-09 DIAGNOSIS — Z515 Encounter for palliative care: Secondary | ICD-10-CM | POA: Diagnosis not present

## 2017-01-09 DIAGNOSIS — D62 Acute posthemorrhagic anemia: Secondary | ICD-10-CM | POA: Diagnosis not present

## 2017-01-09 DIAGNOSIS — R160 Hepatomegaly, not elsewhere classified: Secondary | ICD-10-CM | POA: Diagnosis not present

## 2017-01-09 DIAGNOSIS — E278 Other specified disorders of adrenal gland: Secondary | ICD-10-CM | POA: Diagnosis not present

## 2017-01-09 DIAGNOSIS — I110 Hypertensive heart disease with heart failure: Secondary | ICD-10-CM | POA: Diagnosis not present

## 2017-01-11 DIAGNOSIS — R918 Other nonspecific abnormal finding of lung field: Secondary | ICD-10-CM | POA: Diagnosis not present

## 2017-01-11 DIAGNOSIS — D62 Acute posthemorrhagic anemia: Secondary | ICD-10-CM | POA: Diagnosis not present

## 2017-01-11 DIAGNOSIS — F329 Major depressive disorder, single episode, unspecified: Secondary | ICD-10-CM | POA: Diagnosis not present

## 2017-01-11 DIAGNOSIS — R160 Hepatomegaly, not elsewhere classified: Secondary | ICD-10-CM | POA: Diagnosis not present

## 2017-01-11 DIAGNOSIS — E278 Other specified disorders of adrenal gland: Secondary | ICD-10-CM | POA: Diagnosis not present

## 2017-01-11 DIAGNOSIS — E1165 Type 2 diabetes mellitus with hyperglycemia: Secondary | ICD-10-CM | POA: Diagnosis not present

## 2017-01-11 DIAGNOSIS — I110 Hypertensive heart disease with heart failure: Secondary | ICD-10-CM | POA: Diagnosis not present

## 2017-01-11 DIAGNOSIS — I5033 Acute on chronic diastolic (congestive) heart failure: Secondary | ICD-10-CM | POA: Diagnosis not present

## 2017-01-11 DIAGNOSIS — M5417 Radiculopathy, lumbosacral region: Secondary | ICD-10-CM | POA: Diagnosis not present

## 2017-01-12 ENCOUNTER — Other Ambulatory Visit: Payer: Self-pay | Admitting: *Deleted

## 2017-01-12 ENCOUNTER — Inpatient Hospital Stay: Payer: Medicare HMO

## 2017-01-12 ENCOUNTER — Inpatient Hospital Stay (HOSPITAL_BASED_OUTPATIENT_CLINIC_OR_DEPARTMENT_OTHER): Payer: Medicare HMO | Admitting: Internal Medicine

## 2017-01-12 ENCOUNTER — Encounter: Payer: Self-pay | Admitting: *Deleted

## 2017-01-12 ENCOUNTER — Inpatient Hospital Stay: Payer: Medicare HMO | Admitting: Internal Medicine

## 2017-01-12 ENCOUNTER — Ambulatory Visit: Payer: Medicare HMO | Admitting: *Deleted

## 2017-01-12 VITALS — BP 99/55 | HR 77 | Temp 97.8°F | Resp 22 | Ht 66.0 in | Wt 223.0 lb

## 2017-01-12 VITALS — BP 105/62 | HR 72 | Resp 18

## 2017-01-12 DIAGNOSIS — Z9884 Bariatric surgery status: Secondary | ICD-10-CM

## 2017-01-12 DIAGNOSIS — Z803 Family history of malignant neoplasm of breast: Secondary | ICD-10-CM

## 2017-01-12 DIAGNOSIS — F329 Major depressive disorder, single episode, unspecified: Secondary | ICD-10-CM | POA: Diagnosis not present

## 2017-01-12 DIAGNOSIS — R0602 Shortness of breath: Secondary | ICD-10-CM | POA: Diagnosis not present

## 2017-01-12 DIAGNOSIS — R05 Cough: Secondary | ICD-10-CM

## 2017-01-12 DIAGNOSIS — Z9049 Acquired absence of other specified parts of digestive tract: Secondary | ICD-10-CM

## 2017-01-12 DIAGNOSIS — R918 Other nonspecific abnormal finding of lung field: Secondary | ICD-10-CM

## 2017-01-12 DIAGNOSIS — M5417 Radiculopathy, lumbosacral region: Secondary | ICD-10-CM

## 2017-01-12 DIAGNOSIS — K769 Liver disease, unspecified: Secondary | ICD-10-CM | POA: Diagnosis not present

## 2017-01-12 DIAGNOSIS — M1990 Unspecified osteoarthritis, unspecified site: Secondary | ICD-10-CM

## 2017-01-12 DIAGNOSIS — D500 Iron deficiency anemia secondary to blood loss (chronic): Secondary | ICD-10-CM | POA: Diagnosis not present

## 2017-01-12 DIAGNOSIS — Z79899 Other long term (current) drug therapy: Secondary | ICD-10-CM

## 2017-01-12 DIAGNOSIS — I10 Essential (primary) hypertension: Secondary | ICD-10-CM

## 2017-01-12 DIAGNOSIS — Z794 Long term (current) use of insulin: Secondary | ICD-10-CM

## 2017-01-12 DIAGNOSIS — R1011 Right upper quadrant pain: Secondary | ICD-10-CM | POA: Diagnosis not present

## 2017-01-12 DIAGNOSIS — E7800 Pure hypercholesterolemia, unspecified: Secondary | ICD-10-CM

## 2017-01-12 DIAGNOSIS — I509 Heart failure, unspecified: Secondary | ICD-10-CM

## 2017-01-12 DIAGNOSIS — I110 Hypertensive heart disease with heart failure: Secondary | ICD-10-CM

## 2017-01-12 DIAGNOSIS — E119 Type 2 diabetes mellitus without complications: Secondary | ICD-10-CM

## 2017-01-12 DIAGNOSIS — R5383 Other fatigue: Secondary | ICD-10-CM | POA: Diagnosis not present

## 2017-01-12 DIAGNOSIS — R110 Nausea: Secondary | ICD-10-CM | POA: Diagnosis not present

## 2017-01-12 DIAGNOSIS — I959 Hypotension, unspecified: Secondary | ICD-10-CM | POA: Diagnosis not present

## 2017-01-12 DIAGNOSIS — N179 Acute kidney failure, unspecified: Secondary | ICD-10-CM | POA: Diagnosis not present

## 2017-01-12 DIAGNOSIS — D62 Acute posthemorrhagic anemia: Secondary | ICD-10-CM

## 2017-01-12 DIAGNOSIS — Z87891 Personal history of nicotine dependence: Secondary | ICD-10-CM

## 2017-01-12 DIAGNOSIS — M7989 Other specified soft tissue disorders: Secondary | ICD-10-CM | POA: Diagnosis not present

## 2017-01-12 DIAGNOSIS — Z801 Family history of malignant neoplasm of trachea, bronchus and lung: Secondary | ICD-10-CM

## 2017-01-12 LAB — CBC WITH DIFFERENTIAL/PLATELET
BASOS PCT: 0 %
Basophils Absolute: 0 10*3/uL (ref 0–0.1)
EOS ABS: 0 10*3/uL (ref 0–0.7)
Eosinophils Relative: 0 %
HCT: 28.7 % — ABNORMAL LOW (ref 35.0–47.0)
HEMOGLOBIN: 9.3 g/dL — AB (ref 12.0–16.0)
LYMPHS ABS: 0.8 10*3/uL — AB (ref 1.0–3.6)
Lymphocytes Relative: 6 %
MCH: 29.7 pg (ref 26.0–34.0)
MCHC: 32.5 g/dL (ref 32.0–36.0)
MCV: 91.3 fL (ref 80.0–100.0)
MONO ABS: 0.5 10*3/uL (ref 0.2–0.9)
MONOS PCT: 4 %
NEUTROS PCT: 90 %
Neutro Abs: 11.5 10*3/uL — ABNORMAL HIGH (ref 1.4–6.5)
Platelets: 232 10*3/uL (ref 150–440)
RBC: 3.15 MIL/uL — ABNORMAL LOW (ref 3.80–5.20)
RDW: 22.3 % — AB (ref 11.5–14.5)
WBC: 12.8 10*3/uL — ABNORMAL HIGH (ref 3.6–11.0)

## 2017-01-12 MED ORDER — IRON SUCROSE 20 MG/ML IV SOLN
200.0000 mg | Freq: Once | INTRAVENOUS | Status: AC
  Administered 2017-01-12: 200 mg via INTRAVENOUS
  Filled 2017-01-12: qty 10

## 2017-01-12 MED ORDER — SODIUM CHLORIDE 0.9 % IV SOLN
Freq: Once | INTRAVENOUS | Status: AC
  Administered 2017-01-12: 11:00:00 via INTRAVENOUS
  Filled 2017-01-12: qty 1000

## 2017-01-12 NOTE — Progress Notes (Signed)
4+ pitting edema noted in lower extremities. Pt very weak today. Unable to stand w/o assist. She c/o "extreme fatigue" bp today 99/55.   RN checked Manual bp on right arm. bp 98/50.  Pt took (12.5 mg) of metoprolol this morning before arrival.  She is taking her her lasix 20 mg daily. She c/o dysphagia with solids. Unable to eat solid foods unless she drinks "water with the food at the same time."

## 2017-01-12 NOTE — Assessment & Plan Note (Addendum)
#   Severe IRON DEFICIENCY ANEMIA-hb ~6; likely Upper GIBleed [poor candidate for EGD- Dr.Anna; Jehovas witness; Declines PRBC transfusion]. Multiple IV iron infusions in the hospital. Today hemoglobin is 9.3/slightly better. Continue IV iron infusion weekly.   # Multiple liver masses/ Right Middle lobe Lung mass-? Small cell vs others [poor candidate for biopsy]. Discussed the patient has most likely malignancy in the liver. Patient not too keen on pursuing biopsy- as she is again a poor candidate for any chemotherapy given her multiple medical problems.  # Low BP- cut donw to once a day lopressor.   # I had a long discussion the patient and family- most of her symptoms/feeling poorly is likely from progressive malignancy. I also discussed at length regarding the incurable nature of the disease; patient was evaluated by palliative care as an outpatient. I reviewed the notes in detail. Recommend hospice evaluation. Patient agreeable; however does not want to go to hospice home at this time. However she understands if her clinical condition worsens - she might have to go to a hospice home.   # Plan to continue IV iron.

## 2017-01-12 NOTE — Progress Notes (Signed)
[place] OFFICE PROGRESS NOTE  Patient Care Team: Roselee Nova, MD as PCP - General (Family Medicine) Lyman Speller, RN as [facility], Reed Breech, [place] as Consulting Physician (Optometry) Cammie Sickle, MD as Consulting Physician (Internal Medicine)  Cancer Staging No matching staging information was found for the patient.    No history exists.    # Severe IRON DEFICIENCY ANEMIA-hb ~6; likely Upper GIBleed [ poor candidate for EGD- Dr.Anna; Jehovas witness; Declines PRBC transfusion]  # Multiple liver masses/ Right Middle lobe Lung mass-? Small cell vs others [poor candidate for biopsy]  # CHF diastolic [April 7026, [place]]  INTERVAL HISTORY:  Mackenzie Park 71 y.o.  female  with severe anemia iron deficiency likely GI bleed [poor candidate for endoscopies]; and also with CT scan liver- multiple liver lesions; and also a lesion in the right middle lobe of the lung with atelectasis- highly suspicious for metastases from the lung- again poor candidate for biopsy.  She currently is at home continues to feel weak. Appetite is poor. She is noticing swelling in the legs more The last few days. She has been pretty sedentary at home. Overall feeling poorly. Positive for nausea no vomiting. Denies any chest pain. Shortness of breath on exertion. Positive for cough. Abdominal pain right upper quadrant.  REVIEW OF SYSTEMS:  A complete 10 point review of system is done which is negative except mentioned above/history of present illness.   PAST MEDICAL HISTORY :  Past Medical History:  Diagnosis Date  . Allergic rhinitis   . Cancer ([place])    liver  . CHF (congestive heart failure) ([place])   . Depressive disorder   . Diabetes mellitus ([place])   . Hypercholesteremia   . Hypertension   . Lumbosacral neuritis   . Osteoarthritis   . Tumor liver     PAST SURGICAL HISTORY :   Past Surgical History:  Procedure Laterality Date   . 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 at [facility].   . CHOLECYSTECTOMY    . REPLACEMENT TOTAL KNEE BILATERAL    . TUBAL LIGATION      FAMILY HISTORY :   Family History  Problem Relation Age of Onset  . Dementia Father   . COPD Sister   . Breast cancer Sister   . Lung cancer Sister   . Lung cancer Mother   . Breast cancer Mother   . Heart failure Brother   . Kidney failure Brother     SOCIAL HISTORY:   Social History  Substance Use Topics  . Smoking status: Former Smoker    Packs/day: 1.00    Years: 15.00    Types: Cigarettes    Quit date: 08/20/1995  . Smokeless tobacco: Never Used  . Alcohol use No    ALLERGIES:  is allergic to sulfa antibiotics and other.  MEDICATIONS:  Current Outpatient Prescriptions  Medication Sig Dispense Refill  . ACCU-CHEK AVIVA PLUS test strip     . ACCU-CHEK SOFTCLIX LANCETS lancets     . Blood Glucose Monitoring Suppl (ACCU-CHEK AVIVA PLUS) w/Device KIT     . feeding supplement, GLUCERNA SHAKE, (GLUCERNA SHAKE) LIQD Take 237 mLs by mouth 2 (two) times daily between meals. 30 Can 0  . fluticasone (FLONASE) 50 MCG/ACT nasal spray Place 1-2 sprays into both nostrils daily as needed for allergies.     . furosemide (LASIX) 20 MG tablet Take 1 tablet (20 mg total)  by mouth daily. 30 tablet 0  . gabapentin (NEURONTIN) 300 MG capsule Take 1 capsule (300 mg total) by mouth at bedtime. 90 capsule 0  . iron polysaccharides (NIFEREX) 150 MG capsule Take 1 capsule (150 mg total) by mouth daily. 30 capsule 0  . meloxicam (MOBIC) 15 MG tablet Take 15 mg by mouth daily.     . metoprolol tartrate (LOPRESSOR) 25 MG tablet Take 0.5 tablets (12.5 mg total) by mouth 2 (two) times daily. 60 tablet 0  . Multiple Vitamin (MULTIVITAMIN WITH MINERALS) TABS tablet Take 1 tablet by mouth daily. 30 tablet 0  . pantoprazole (PROTONIX) 40 MG tablet Take 1 tablet (40 mg total) by mouth 2 (two) times daily before a meal. 60 tablet 1  . potassium chloride  SA (K-DUR,KLOR-CON) 20 MEQ tablet Take 1 tablet (20 mEq total) by mouth 2 (two) times daily. 30 tablet 0  . sitaGLIPtin (JANUVIA) 100 MG tablet Take 1 tablet (100 mg total) by mouth daily. 270 tablet 0  . sucralfate (CARAFATE) 1 GM/10ML suspension Take 10 mLs (1 g total) by mouth 4 (four) times daily -  with meals and at bedtime. 420 mL 0  . insulin glargine (LANTUS) 100 UNIT/ML injection Inject 0.2 mLs (20 Units total) into the skin at bedtime. Increase to 25 units after 3 days if sugars not under 200 10 mL 11   No current facility-administered medications for this visit.     PHYSICAL EXAMINATION: ECOG PERFORMANCE STATUS: 3 - Symptomatic, >50% confined to bed  BP (!) 99/55   Pulse 77   Temp 97.8 F (36.6 C) (Tympanic)   Resp (!) 22   Ht _0  (1.676 m)   Wt 223 lb (101.2 kg)   BMI 35.99 kg/m   Filed Weights   01/12/17 0938  Weight: 223 lb (101.2 kg)    GENERAL: Well-nourished well-developed; Alert, no distress and comfortable.   Accompanied by a friend/family. Patient is a wheelchair. EYES: Positive for pallor.  OROPHARYNX: no thrush or ulceration; dentures. NECK: supple, no masses felt LYMPH:  no palpable lymphadenopathy in the cervical, axillary or inguinal regions LUNGS: clear to auscultation and  No wheeze or crackles HEART/CVS: regular rate & rhythm and no murmurs; 2+ bilateral lower extremity edema ABDOMEN:abdomen soft, non-tender and normal bowel sounds; positive for hepatomegaly. Musculoskeletal:no cyanosis of digits and no clubbing  PSYCH: alert & oriented x 3 with fluent speech NEURO: no focal motor/sensory deficits SKIN:  no rashes or significant lesions  LABORATORY DATA:  I have reviewed the data as listed    Component Value Date/Time   NA 134 (L) 01/04/2017 1340   K 3.0 (L) 01/04/2017 1340   CL 91 (L) 01/04/2017 1340   CO2 33 (H) 01/04/2017 1340   GLUCOSE 251 (H) 01/04/2017 1340   BUN 20 01/04/2017 1340   CREATININE 0.78 01/04/2017 1340   CREATININE  0.66 11/16/2016 0955   CALCIUM 8.6 (L) 01/04/2017 1340   PROT 5.9 (L) 01/04/2017 1340   ALBUMIN 3.0 (L) 01/04/2017 1340   AST 89 (H) 01/04/2017 1340   ALT 112 (H) 01/04/2017 1340   ALKPHOS 145 (H) 01/04/2017 1340   BILITOT 2.0 (H) 01/04/2017 1340   GFRNONAA >60 01/04/2017 1340   GFRNONAA >89 11/16/2016 0955   GFRAA >60 01/04/2017 1340   GFRAA >89 11/16/2016 0955    No results found for: SPEP, UPEP  Lab Results  Component Value Date   WBC 12.8 (H) 01/12/2017   NEUTROABS 11.5 (H) 01/12/2017   HGB  9.3 (L) 01/12/2017   HCT 28.7 (L) 01/12/2017   MCV 91.3 01/12/2017   PLT 232 01/12/2017      Chemistry      Component Value Date/Time   NA 134 (L) 01/04/2017 1340   K 3.0 (L) 01/04/2017 1340   CL 91 (L) 01/04/2017 1340   CO2 33 (H) 01/04/2017 1340   BUN 20 01/04/2017 1340   CREATININE 0.78 01/04/2017 1340   CREATININE 0.66 11/16/2016 0955      Component Value Date/Time   CALCIUM 8.6 (L) 01/04/2017 1340   ALKPHOS 145 (H) 01/04/2017 1340   AST 89 (H) 01/04/2017 1340   ALT 112 (H) 01/04/2017 1340   BILITOT 2.0 (H) 01/04/2017 1340       RADIOGRAPHIC STUDIES: I have personally reviewed the radiological images as listed and agreed with the findings in the report. No results found.   ASSESSMENT & PLAN:  Iron deficiency anemia due to chronic blood loss # Severe IRON DEFICIENCY ANEMIA-hb ~6; likely Upper GIBleed [poor candidate for EGD- Dr.Anna; Jehovas witness; Declines PRBC transfusion]. Multiple IV iron infusions in the hospital. Today hemoglobin is 9.3/slightly better. Continue IV iron infusion weekly.   # Multiple liver masses/ Right Middle lobe Lung mass-? Small cell vs others [poor candidate for biopsy]. Discussed the patient has most likely malignancy in the liver. Patient not too keen on pursuing biopsy- as she is again a poor candidate for any chemotherapy given her multiple medical problems.  # Low BP- cut donw to once a day lopressor.   # I had a long discussion  the patient and family- most of her symptoms/feeling poorly is likely from progressive malignancy. I also discussed at length regarding the incurable nature of the disease; patient was evaluated by palliative care as an outpatient. I reviewed the notes in detail. Recommend hospice evaluation. Patient agreeable; however does not want to go to hospice home at this time. However she understands if her clinical condition worsens - she might have to go to a hospice home.   # Plan to continue IV iron.    Orders Placed This Encounter  Procedures  . CBC with Differential/Platelet    Standing Status:   Future    Standing Expiration Date:   01/12/2018  . Comprehensive metabolic panel    Standing Status:   Future    Standing Expiration Date:   01/12/2018  . CBC with Differential/Platelet    Standing Status:   Future    Standing Expiration Date:   01/12/2018  . Comprehensive metabolic panel    Standing Status:   Future    Standing Expiration Date:   01/12/2018   All questions were answered. The patient knows to call the clinic with any problems, questions or concerns.      Cammie Sickle, MD 01/13/2017 4:24 PM   Addendum- patient was seen by PCP Dr. Sanda Klein- on May 11 th -elevated blood sugars 450 range; I recommend admission to the hospital; as per PCP- patient unable to live at home by herself. Pt will need hospice home placement.

## 2017-01-12 NOTE — Progress Notes (Signed)
  Oncology Nurse Navigator Documentation  Navigator Location: [facility]-Med Onc (01/12/17 1000)   )Navigator Encounter Type: Follow-up Appt (01/12/17 1000)                     Patient Visit Type: MedOnc (01/12/17 1000)   Barriers/Navigation Needs: Coordination of Care (01/12/17 1000)   Interventions: Referrals (01/12/17 1000) Referrals: Other (hospice) (01/12/17 1000)           met with patient during follow up visit with Dr. Rogue Bussing. At this time, pt does not want any treatment and has declined biopsy. Pt wishes to pursue hospice services. Will discuss hospice referral with palliative care and coordinate referral to hospice. Pt will need palliative iron infusions due to being jehovah's witness and refusing blood products.         Time Spent with Patient: 30 (01/12/17 1000)

## 2017-01-12 NOTE — Patient Outreach (Signed)
Attempt made to contact pt for transition of care, ongoing follow up on recent hospitalization 4/17-4/20 for healthcare pneumonia, Upper GI bleed, Type 2 DM with hyperglycemia.  HIPAA compliant voice message left with contact name and number.     Plan:  If no response, plan to follow up again within the next 4 days.    Mackenzie Park.   [facility]  [phone]

## 2017-01-13 ENCOUNTER — Ambulatory Visit (INDEPENDENT_AMBULATORY_CARE_PROVIDER_SITE_OTHER): Payer: Medicare HMO | Admitting: Family Medicine

## 2017-01-13 ENCOUNTER — Emergency Department: Payer: Medicare HMO

## 2017-01-13 ENCOUNTER — Encounter: Payer: Self-pay | Admitting: *Deleted

## 2017-01-13 ENCOUNTER — Telehealth: Payer: Self-pay | Admitting: *Deleted

## 2017-01-13 ENCOUNTER — Encounter: Payer: Self-pay | Admitting: Family Medicine

## 2017-01-13 ENCOUNTER — Inpatient Hospital Stay: Payer: Medicare HMO

## 2017-01-13 ENCOUNTER — Inpatient Hospital Stay
Admission: EM | Admit: 2017-01-13 | Discharge: 2017-01-18 | DRG: 291 | Disposition: A | Discharge status: Skilled Nursing Facility | Payer: Medicare HMO | Attending: Internal Medicine | Admitting: Internal Medicine

## 2017-01-13 VITALS — BP 124/72 | HR 90 | Temp 98.6°F | Resp 16

## 2017-01-13 DIAGNOSIS — Z882 Allergy status to sulfonamides status: Secondary | ICD-10-CM

## 2017-01-13 DIAGNOSIS — D500 Iron deficiency anemia secondary to blood loss (chronic): Secondary | ICD-10-CM | POA: Diagnosis present

## 2017-01-13 DIAGNOSIS — R531 Weakness: Secondary | ICD-10-CM

## 2017-01-13 DIAGNOSIS — Z801 Family history of malignant neoplasm of trachea, bronchus and lung: Secondary | ICD-10-CM

## 2017-01-13 DIAGNOSIS — I5033 Acute on chronic diastolic (congestive) heart failure: Secondary | ICD-10-CM | POA: Diagnosis present

## 2017-01-13 DIAGNOSIS — J181 Lobar pneumonia, unspecified organism: Secondary | ICD-10-CM | POA: Diagnosis not present

## 2017-01-13 DIAGNOSIS — Z9884 Bariatric surgery status: Secondary | ICD-10-CM

## 2017-01-13 DIAGNOSIS — Z66 Do not resuscitate: Secondary | ICD-10-CM | POA: Diagnosis not present

## 2017-01-13 DIAGNOSIS — Z841 Family history of disorders of kidney and ureter: Secondary | ICD-10-CM

## 2017-01-13 DIAGNOSIS — R945 Abnormal results of liver function studies: Secondary | ICD-10-CM | POA: Diagnosis not present

## 2017-01-13 DIAGNOSIS — Z7401 Bed confinement status: Secondary | ICD-10-CM | POA: Diagnosis not present

## 2017-01-13 DIAGNOSIS — E7800 Pure hypercholesterolemia, unspecified: Secondary | ICD-10-CM | POA: Diagnosis not present

## 2017-01-13 DIAGNOSIS — R160 Hepatomegaly, not elsewhere classified: Secondary | ICD-10-CM | POA: Diagnosis not present

## 2017-01-13 DIAGNOSIS — D630 Anemia in neoplastic disease: Secondary | ICD-10-CM | POA: Diagnosis present

## 2017-01-13 DIAGNOSIS — Z794 Long term (current) use of insulin: Secondary | ICD-10-CM

## 2017-01-13 DIAGNOSIS — R739 Hyperglycemia, unspecified: Secondary | ICD-10-CM

## 2017-01-13 DIAGNOSIS — F329 Major depressive disorder, single episode, unspecified: Secondary | ICD-10-CM | POA: Diagnosis present

## 2017-01-13 DIAGNOSIS — C3490 Malignant neoplasm of unspecified part of unspecified bronchus or lung: Secondary | ICD-10-CM | POA: Diagnosis present

## 2017-01-13 DIAGNOSIS — E876 Hypokalemia: Secondary | ICD-10-CM | POA: Diagnosis present

## 2017-01-13 DIAGNOSIS — M1990 Unspecified osteoarthritis, unspecified site: Secondary | ICD-10-CM | POA: Diagnosis present

## 2017-01-13 DIAGNOSIS — M5417 Radiculopathy, lumbosacral region: Secondary | ICD-10-CM | POA: Diagnosis not present

## 2017-01-13 DIAGNOSIS — D649 Anemia, unspecified: Secondary | ICD-10-CM | POA: Diagnosis not present

## 2017-01-13 DIAGNOSIS — I509 Heart failure, unspecified: Secondary | ICD-10-CM | POA: Diagnosis not present

## 2017-01-13 DIAGNOSIS — Z8249 Family history of ischemic heart disease and other diseases of the circulatory system: Secondary | ICD-10-CM | POA: Diagnosis not present

## 2017-01-13 DIAGNOSIS — Z79899 Other long term (current) drug therapy: Secondary | ICD-10-CM

## 2017-01-13 DIAGNOSIS — E119 Type 2 diabetes mellitus without complications: Secondary | ICD-10-CM | POA: Diagnosis not present

## 2017-01-13 DIAGNOSIS — E1165 Type 2 diabetes mellitus with hyperglycemia: Secondary | ICD-10-CM | POA: Diagnosis not present

## 2017-01-13 DIAGNOSIS — Z96653 Presence of artificial knee joint, bilateral: Secondary | ICD-10-CM | POA: Diagnosis present

## 2017-01-13 DIAGNOSIS — Z87891 Personal history of nicotine dependence: Secondary | ICD-10-CM

## 2017-01-13 DIAGNOSIS — Z803 Family history of malignant neoplasm of breast: Secondary | ICD-10-CM

## 2017-01-13 DIAGNOSIS — I10 Essential (primary) hypertension: Secondary | ICD-10-CM | POA: Diagnosis not present

## 2017-01-13 DIAGNOSIS — Z91011 Allergy to milk products: Secondary | ICD-10-CM | POA: Diagnosis not present

## 2017-01-13 DIAGNOSIS — D62 Acute posthemorrhagic anemia: Secondary | ICD-10-CM | POA: Diagnosis not present

## 2017-01-13 DIAGNOSIS — I110 Hypertensive heart disease with heart failure: Principal | ICD-10-CM | POA: Diagnosis present

## 2017-01-13 DIAGNOSIS — Z791 Long term (current) use of non-steroidal anti-inflammatories (NSAID): Secondary | ICD-10-CM

## 2017-01-13 DIAGNOSIS — M6281 Muscle weakness (generalized): Secondary | ICD-10-CM | POA: Diagnosis not present

## 2017-01-13 DIAGNOSIS — C799 Secondary malignant neoplasm of unspecified site: Secondary | ICD-10-CM | POA: Diagnosis present

## 2017-01-13 DIAGNOSIS — R7989 Other specified abnormal findings of blood chemistry: Secondary | ICD-10-CM

## 2017-01-13 DIAGNOSIS — R0602 Shortness of breath: Secondary | ICD-10-CM | POA: Diagnosis not present

## 2017-01-13 DIAGNOSIS — R918 Other nonspecific abnormal finding of lung field: Secondary | ICD-10-CM | POA: Diagnosis not present

## 2017-01-13 DIAGNOSIS — L8990 Pressure ulcer of unspecified site, unspecified stage: Secondary | ICD-10-CM | POA: Insufficient documentation

## 2017-01-13 DIAGNOSIS — Z825 Family history of asthma and other chronic lower respiratory diseases: Secondary | ICD-10-CM

## 2017-01-13 DIAGNOSIS — E278 Other specified disorders of adrenal gland: Secondary | ICD-10-CM | POA: Diagnosis not present

## 2017-01-13 DIAGNOSIS — J189 Pneumonia, unspecified organism: Secondary | ICD-10-CM

## 2017-01-13 DIAGNOSIS — E1142 Type 2 diabetes mellitus with diabetic polyneuropathy: Secondary | ICD-10-CM | POA: Diagnosis not present

## 2017-01-13 DIAGNOSIS — R600 Localized edema: Secondary | ICD-10-CM | POA: Diagnosis not present

## 2017-01-13 DIAGNOSIS — R1310 Dysphagia, unspecified: Secondary | ICD-10-CM | POA: Diagnosis not present

## 2017-01-13 LAB — BASIC METABOLIC PANEL
ANION GAP: 13 (ref 5–15)
BUN: 22 mg/dL — ABNORMAL HIGH (ref 6–20)
CALCIUM: 9 mg/dL (ref 8.9–10.3)
CO2: 30 mmol/L (ref 22–32)
CREATININE: 0.86 mg/dL (ref 0.44–1.00)
Chloride: 93 mmol/L — ABNORMAL LOW (ref 101–111)
GLUCOSE: 288 mg/dL — AB (ref 65–99)
Potassium: 4.1 mmol/L (ref 3.5–5.1)
Sodium: 136 mmol/L (ref 135–145)

## 2017-01-13 LAB — CBC
HCT: 29.1 % — ABNORMAL LOW (ref 35.0–47.0)
HEMOGLOBIN: 9.5 g/dL — AB (ref 12.0–16.0)
MCH: 30.3 pg (ref 26.0–34.0)
MCHC: 32.6 g/dL (ref 32.0–36.0)
MCV: 93.2 fL (ref 80.0–100.0)
PLATELETS: 219 10*3/uL (ref 150–440)
RBC: 3.13 MIL/uL — ABNORMAL LOW (ref 3.80–5.20)
RDW: 22.6 % — ABNORMAL HIGH (ref 11.5–14.5)
WBC: 13.5 10*3/uL — ABNORMAL HIGH (ref 3.6–11.0)

## 2017-01-13 LAB — GLUCOSE, POCT (MANUAL RESULT ENTRY): POC Glucose: 269 mg/dl — AB (ref 70–99)

## 2017-01-13 LAB — GLUCOSE, CAPILLARY: GLUCOSE-CAPILLARY: 256 mg/dL — AB (ref 65–99)

## 2017-01-13 LAB — BRAIN NATRIURETIC PEPTIDE: B NATRIURETIC PEPTIDE 5: 163 pg/mL — AB (ref 0.0–100.0)

## 2017-01-13 MED ORDER — POLYSACCHARIDE IRON COMPLEX 150 MG PO CAPS
150.0000 mg | ORAL_CAPSULE | Freq: Every day | ORAL | Status: DC
  Administered 2017-01-14 – 2017-01-18 (×5): 150 mg via ORAL
  Filled 2017-01-13 (×5): qty 1

## 2017-01-13 MED ORDER — INSULIN GLARGINE 100 UNIT/ML ~~LOC~~ SOLN: 20.0000 [IU] | Freq: Every day | SUBCUTANEOUS | 11 refills | Status: DC

## 2017-01-13 MED ORDER — CEFTRIAXONE SODIUM IN DEXTROSE 20 MG/ML IV SOLN
1.0000 g | INTRAVENOUS | Status: DC
  Filled 2017-01-13: qty 50

## 2017-01-13 MED ORDER — DEXTROSE 5 % IV SOLN
250.0000 mg | INTRAVENOUS | Status: DC
  Filled 2017-01-13: qty 250

## 2017-01-13 MED ORDER — METOPROLOL TARTRATE 25 MG PO TABS
12.5000 mg | ORAL_TABLET | Freq: Two times a day (BID) | ORAL | Status: DC
  Administered 2017-01-14 – 2017-01-18 (×10): 12.5 mg via ORAL
  Filled 2017-01-13 (×10): qty 1

## 2017-01-13 MED ORDER — MELOXICAM 7.5 MG PO TABS
15.0000 mg | ORAL_TABLET | Freq: Every day | ORAL | Status: DC
  Administered 2017-01-14 – 2017-01-18 (×5): 15 mg via ORAL
  Filled 2017-01-13 (×5): qty 2

## 2017-01-13 MED ORDER — INSULIN ASPART 100 UNIT/ML ~~LOC~~ SOLN
0.0000 [IU] | Freq: Three times a day (TID) | SUBCUTANEOUS | Status: DC
  Administered 2017-01-14: 13:00:00 5 [IU] via SUBCUTANEOUS
  Administered 2017-01-14: 7 [IU] via SUBCUTANEOUS
  Administered 2017-01-14: 5 [IU] via SUBCUTANEOUS
  Administered 2017-01-15 (×2): 3 [IU] via SUBCUTANEOUS
  Administered 2017-01-15: 12:00:00 5 [IU] via SUBCUTANEOUS
  Administered 2017-01-16 (×3): 2 [IU] via SUBCUTANEOUS
  Administered 2017-01-17: 3 [IU] via SUBCUTANEOUS
  Administered 2017-01-17 – 2017-01-18 (×4): 2 [IU] via SUBCUTANEOUS
  Filled 2017-01-13: qty 2
  Filled 2017-01-13: qty 3
  Filled 2017-01-13 (×3): qty 2
  Filled 2017-01-13: qty 3
  Filled 2017-01-13: qty 2
  Filled 2017-01-13: qty 5
  Filled 2017-01-13: qty 2
  Filled 2017-01-13: qty 3
  Filled 2017-01-13: qty 5
  Filled 2017-01-13: qty 2
  Filled 2017-01-13: qty 5
  Filled 2017-01-13: qty 7

## 2017-01-13 MED ORDER — HEPARIN SODIUM (PORCINE) 5000 UNIT/ML IJ SOLN
5000.0000 [IU] | Freq: Three times a day (TID) | INTRAMUSCULAR | Status: DC
  Administered 2017-01-14 – 2017-01-18 (×13): 5000 [IU] via SUBCUTANEOUS
  Filled 2017-01-13 (×13): qty 1

## 2017-01-13 MED ORDER — SUCRALFATE 1 GM/10ML PO SUSP
1.0000 g | Freq: Three times a day (TID) | ORAL | Status: DC
  Administered 2017-01-14 – 2017-01-18 (×18): 1 g via ORAL
  Filled 2017-01-13 (×18): qty 10

## 2017-01-13 MED ORDER — GABAPENTIN 300 MG PO CAPS
300.0000 mg | ORAL_CAPSULE | Freq: Every day | ORAL | Status: DC
  Administered 2017-01-14 – 2017-01-17 (×5): 300 mg via ORAL
  Filled 2017-01-13 (×5): qty 1

## 2017-01-13 MED ORDER — DOCUSATE SODIUM 100 MG PO CAPS: 100.0000 mg | ORAL_CAPSULE | Freq: Two times a day (BID) | ORAL | Status: DC | PRN

## 2017-01-13 MED ORDER — INSULIN GLARGINE 100 UNIT/ML ~~LOC~~ SOLN
20.0000 [IU] | Freq: Every day | SUBCUTANEOUS | Status: DC
  Administered 2017-01-14 – 2017-01-15 (×3): 20 [IU] via SUBCUTANEOUS
  Filled 2017-01-13 (×5): qty 0.2

## 2017-01-13 MED ORDER — FUROSEMIDE 10 MG/ML IJ SOLN
20.0000 mg | Freq: Two times a day (BID) | INTRAMUSCULAR | Status: DC
  Administered 2017-01-13 – 2017-01-15 (×4): 20 mg via INTRAVENOUS
  Filled 2017-01-13: qty 2
  Filled 2017-01-13: qty 4
  Filled 2017-01-13 (×2): qty 2

## 2017-01-13 MED ORDER — GLUCERNA SHAKE PO LIQD
237.0000 mL | Freq: Two times a day (BID) | ORAL | Status: DC
  Administered 2017-01-14 (×2): 237 mL via ORAL

## 2017-01-13 MED ORDER — FLUTICASONE PROPIONATE 50 MCG/ACT NA SUSP
1.0000 | Freq: Every day | NASAL | Status: DC | PRN
  Filled 2017-01-13: qty 16

## 2017-01-13 MED ORDER — DEXTROSE 5 % IV SOLN
500.0000 mg | Freq: Once | INTRAVENOUS | Status: AC
  Administered 2017-01-13: 500 mg via INTRAVENOUS
  Filled 2017-01-13: qty 500

## 2017-01-13 MED ORDER — ADULT MULTIVITAMIN W/MINERALS CH
1.0000 | ORAL_TABLET | Freq: Every day | ORAL | Status: DC
  Administered 2017-01-14 – 2017-01-18 (×5): 1 via ORAL
  Filled 2017-01-13 (×5): qty 1

## 2017-01-13 MED ORDER — LINAGLIPTIN 5 MG PO TABS
5.0000 mg | ORAL_TABLET | Freq: Every day | ORAL | Status: DC
  Administered 2017-01-14 – 2017-01-18 (×5): 5 mg via ORAL
  Filled 2017-01-13 (×5): qty 1

## 2017-01-13 MED ORDER — POTASSIUM CHLORIDE CRYS ER 20 MEQ PO TBCR
20.0000 meq | EXTENDED_RELEASE_TABLET | Freq: Two times a day (BID) | ORAL | Status: DC
  Administered 2017-01-14 – 2017-01-18 (×10): 20 meq via ORAL
  Filled 2017-01-13 (×10): qty 1

## 2017-01-13 MED ORDER — PANTOPRAZOLE SODIUM 40 MG PO TBEC
40.0000 mg | DELAYED_RELEASE_TABLET | Freq: Two times a day (BID) | ORAL | Status: DC
  Administered 2017-01-14 – 2017-01-18 (×9): 40 mg via ORAL
  Filled 2017-01-13 (×9): qty 1

## 2017-01-13 MED ORDER — CEFTRIAXONE SODIUM IN DEXTROSE 20 MG/ML IV SOLN
1.0000 g | Freq: Once | INTRAVENOUS | Status: AC
  Administered 2017-01-13: 1 g via INTRAVENOUS
  Filled 2017-01-13: qty 50

## 2017-01-13 NOTE — ED Notes (Signed)
Attempt to start IV and obtain blood cultures, unsuccessful.

## 2017-01-13 NOTE — H&P (Signed)
[facility] [place] NAME: Mackenzie Park    MR#:  [id number]  DATE OF BIRTH:  14-Jan-1946  DATE OF ADMISSION:  01/13/2017  PRIMARY CARE PHYSICIAN: Roselee Nova, MD   REQUESTING/REFERRING PHYSICIAN: Corky Downs  CHIEF COMPLAINT:   Chief Complaint  Patient presents with  . Weakness    HISTORY OF PRESENT ILLNESS: Mackenzie Park  is a 71 y.o. female with a known history of Metastatic cancer, CHF, depression, hypertension, hypercholesterolemia, diabetes- was admitted in hospital for bleeding issues and blood loss anemia 3 weeks ago, and a half had metastatic cancer patient decided to be DO NOT RESUSCITATE and go home and live with her family. Since then she continue taking iron supplement and follow with cancer Center as her hemoglobin is coming up. But she continued to get significantly worse in her strength and she need complete care now by her sister and family. She also had some cough and increasing swelling on his both legs. Concerned with this finally she decided she may need to turn over to hospice and so came to hospital for that. On chest x-ray she was found to have some new opacities on the lung possibly concerning for pneumonia and so ER physician suggested to admit to hospitalist for that.  PAST MEDICAL HISTORY:   Past Medical History:  Diagnosis Date  . Allergic rhinitis   . Cancer ([place])    liver  . CHF (congestive heart failure) ([place])   . Depressive disorder   . Diabetes mellitus ([place])   . Hypercholesteremia   . Hypertension   . Lumbosacral neuritis   . Osteoarthritis   . Tumor liver     PAST SURGICAL HISTORY: Past Surgical History:  Procedure Laterality Date  . 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 at [facility].   . CHOLECYSTECTOMY    . REPLACEMENT TOTAL KNEE BILATERAL    . TUBAL LIGATION      SOCIAL HISTORY:  Social History  Substance Use Topics  . Smoking status: Former Smoker    Packs/day: 1.00    Years:  15.00    Types: Cigarettes    Quit date: 08/20/1995  . Smokeless tobacco: Never Used  . Alcohol use No    FAMILY HISTORY:  Family History  Problem Relation Age of Onset  . Dementia Father   . COPD Sister   . Breast cancer Sister   . Lung cancer Sister   . Lung cancer Mother   . Breast cancer Mother   . Heart failure Brother   . Kidney failure Brother     DRUG ALLERGIES:  Allergies  Allergen Reactions  . Sulfa Antibiotics Anaphylaxis and Hives  . Other Nausea And Vomiting    Milk products    REVIEW OF SYSTEMS:   CONSTITUTIONAL: No fever, fatigue or weakness.  EYES: No blurred or double vision.  EARS, NOSE, AND THROAT: No tinnitus or ear pain.  RESPIRATORY: Some cough, no shortness of breath, wheezing or hemoptysis.  CARDIOVASCULAR: No chest pain, orthopnea, edema.  GASTROINTESTINAL: No nausea, vomiting, diarrhea or abdominal pain.  GENITOURINARY: No dysuria, hematuria.  ENDOCRINE: No polyuria, nocturia,  HEMATOLOGY: No anemia, easy bruising or bleeding SKIN: No rash or lesion. MUSCULOSKELETAL: No joint pain or arthritis.   NEUROLOGIC: No tingling, numbness, weakness.  PSYCHIATRY: No anxiety or depression.   MEDICATIONS AT HOME:  Prior to Admission medications   Medication Sig Start Date End Date Taking? Authorizing Provider  ACCU-CHEK AVIVA PLUS  test strip  12/13/16  Yes [provider]  ACCU-CHEK SOFTCLIX LANCETS lancets  12/22/16  Yes [provider]  Blood Glucose Monitoring Suppl (ACCU-CHEK AVIVA PLUS) w/Device KIT  12/22/16  Yes [provider]  feeding supplement, GLUCERNA SHAKE, (GLUCERNA SHAKE) LIQD Take 237 mLs by mouth 2 (two) times daily between meals. 12/23/16  Yes Fritzi Mandes, MD  fluticasone (FLONASE) 50 MCG/ACT nasal spray Place 1-2 sprays into both nostrils daily as needed for allergies.    Yes [provider]  furosemide (LASIX) 20 MG tablet Take 1 tablet (20 mg total) by mouth daily. 12/20/16  Yes Arrien, Jimmy Picket, MD  gabapentin (NEURONTIN) 300 MG capsule Take 1 capsule (300 mg total) by mouth at bedtime. 11/08/16  Yes Rochel Brome A, MD  insulin glargine (LANTUS) 100 UNIT/ML injection Inject 0.2 mLs (20 Units total) into the skin at bedtime. Increase to 25 units after 3 days if sugars not under 200 01/13/17  Yes Lada, Satira Anis, MD  iron polysaccharides (NIFEREX) 150 MG capsule Take 1 capsule (150 mg total) by mouth daily. 12/23/16  Yes Fritzi Mandes, MD  meloxicam (MOBIC) 15 MG tablet Take 15 mg by mouth daily.  11/09/16  Yes [provider]  metoprolol tartrate (LOPRESSOR) 25 MG tablet Take 0.5 tablets (12.5 mg total) by mouth 2 (two) times daily. 12/19/16  Yes Arrien, Jimmy Picket, MD  Multiple Vitamin (MULTIVITAMIN WITH MINERALS) TABS tablet Take 1 tablet by mouth daily. 12/23/16  Yes Fritzi Mandes, MD  pantoprazole (PROTONIX) 40 MG tablet Take 1 tablet (40 mg total) by mouth 2 (two) times daily before a meal. 12/23/16  Yes Fritzi Mandes, MD  potassium chloride SA (K-DUR,KLOR-CON) 20 MEQ tablet Take 1 tablet (20 mEq total) by mouth 2 (two) times daily. 01/05/17  Yes Cammie Sickle, MD  sitaGLIPtin (JANUVIA) 100 MG tablet Take 1 tablet (100 mg total) by mouth daily. 11/16/16  Yes Roselee Nova, MD  sucralfate (CARAFATE) 1 GM/10ML suspension Take 10 mLs (1 g total) by mouth 4 (four) times daily -  with meals and at bedtime. 12/23/16  Yes Fritzi Mandes, MD      PHYSICAL EXAMINATION:   VITAL SIGNS: Blood pressure (!) 102/47, pulse 89, temperature 98.6 F (37 C), temperature source Oral, resp. rate (!) 22, height 5' 6"  (1.676 m), weight 101.2 kg (223 lb), SpO2 90 %.  GENERAL:  71 y.o.-year-old patient lying in the bed with no acute distress.  EYES: Pupils equal, round, reactive to light and accommodation. No scleral icterus. Extraocular muscles intact.  HEENT: Head atraumatic, normocephalic. Oropharynx and nasopharynx clear.  NECK:  Supple, no jugular venous distention. No thyroid  enlargement, no tenderness.  LUNGS: Normal breath sounds bilaterally, no wheezing, Some crepitation. No use of accessory muscles of respiration.  CARDIOVASCULAR: S1, S2 normal. No murmurs, rubs, or gallops.  ABDOMEN: Soft, nontender, nondistended. Bowel sounds present. No organomegaly or mass.  EXTREMITIES: Significant bilateral pedal edema, no cyanosis, or clubbing.  NEUROLOGIC: Cranial nerves II through XII are intact. Muscle strength 5/5 in all extremities. Sensation intact. Gait not checked.  PSYCHIATRIC: The patient is alert and oriented x 3.  SKIN: No obvious rash, lesion, or ulcer.   LABORATORY PANEL:   CBC  Recent Labs Lab 01/12/17 0900 01/13/17 1630  WBC 12.8* 13.5*  HGB 9.3* 9.5*  HCT 28.7* 29.1*  PLT 232 219  MCV 91.3 93.2  MCH 29.7 30.3  MCHC 32.5 32.6  RDW 22.3* 22.6*  LYMPHSABS 0.8*  --  MONOABS 0.5  --   EOSABS 0.0  --   BASOSABS 0.0  --    ------------------------------------------------------------------------------------------------------------------  Chemistries   Recent Labs Lab 01/13/17 1630  NA 136  K 4.1  CL 93*  CO2 30  GLUCOSE 288*  BUN 22*  CREATININE 0.86  CALCIUM 9.0   ------------------------------------------------------------------------------------------------------------------ estimated creatinine clearance is 74.2 mL/min (by C-G formula based on SCr of 0.86 mg/dL). ------------------------------------------------------------------------------------------------------------------ No results for input(s): TSH, T4TOTAL, T3FREE, THYROIDAB in the last 72 hours.  Invalid input(s): FREET3   Coagulation profile No results for input(s): INR, PROTIME in the last 168 hours. ------------------------------------------------------------------------------------------------------------------- No results for input(s): DDIMER in the last 72  hours. -------------------------------------------------------------------------------------------------------------------  Cardiac Enzymes No results for input(s): CKMB, TROPONINI, MYOGLOBIN in the last 168 hours.  Invalid input(s): CK ------------------------------------------------------------------------------------------------------------------ Invalid input(s): POCBNP  ---------------------------------------------------------------------------------------------------------------  Urinalysis    Component Value Date/Time   COLORURINE YELLOW (A) 12/20/2016 1917   APPEARANCEUR HAZY (A) 12/20/2016 1917   LABSPEC 1.017 12/20/2016 1917   PHURINE 5.0 12/20/2016 1917   GLUCOSEU 150 (A) 12/20/2016 1917   HGBUR NEGATIVE 12/20/2016 1917   BILIRUBINUR NEGATIVE 12/20/2016 1917   KETONESUR NEGATIVE 12/20/2016 1917   PROTEINUR NEGATIVE 12/20/2016 1917   NITRITE NEGATIVE 12/20/2016 1917   LEUKOCYTESUR TRACE (A) 12/20/2016 1917     RADIOLOGY: Dg Chest 1 View  Result Date: 01/13/2017 CLINICAL DATA:  Increasing weakness.  Shortness of breath. EXAM: CHEST 1 VIEW COMPARISON:  Radiograph 12/20/2016, CT 12/15/2016 FINDINGS: Lower lung volumes from prior. Heart size and mediastinal contours are unchanged allowing for differences in technique. Known right hilar mass not well characterized radiographically. Associated postobstructive collapse in the right perihilar lung (upper lobe on CT). There is a new medial retrocardiac opacity at the left lung base. No pulmonary edema. Possible trace left pleural effusion. No acute osseous abnormalities. IMPRESSION: Retrocardiac left basilar opacity with small pleural effusion. This may be atelectasis or pneumonia. Partial right upper lobe collapse secondary to known right hilar mass/adenopathy, unchanged. Electronically Signed   By: Jeb Levering M.D.   On: 01/13/2017 18:45    EKG: Orders placed or performed during the hospital encounter of 01/13/17  . ED  EKG  . ED EKG    IMPRESSION AND PLAN:  * Pneumonia   We will give ceftriaxone and azithromycin and follow clinically.  * Bilateral pedal edema   Combination of immobility, metastatic cancer, CHF   Give IV Lasix at higher dose but patient is agreeing now for hospice services so we will wait for palliative care.  * Metastatic cancer   Follows with cancer Center, she wanted to go for hospice services, wait for hospice evaluation.  * Anemia due to blood loss and cancer   Currently stable with oral iron therapy, continue monitoring.    All the records are reviewed and case discussed with ED provider. Management plans discussed with the patient, family and they are in agreement.  CODE STATUS: Date Active Date Inactive Code Status Order ID Comments User Context   12/20/2016  9:29 PM 12/23/2016  5:13 PM DNR [id number]  Loletha Grayer, MD ED   12/12/2016  3:57 AM 12/19/2016  8:04 PM Full Code [id number]  Ivor Costa, MD ED   05/11/2015  2:13 PM 05/12/2015  4:10 PM Full Code [id number]  Bettey Costa, MD Inpatient    Questions for Most Recent Historical Code Status (Order [id number])    Question Answer Comment   In the event of cardiac or respiratory ARREST Do not call a "code  blue"    In the event of cardiac or respiratory ARREST Do not perform Intubation, CPR, defibrillation or ACLS    In the event of cardiac or respiratory ARREST Use medication by any route, position, wound care, and other measures to relive pain and suffering. May use oxygen, suction and manual treatment of airway obstruction as needed for comfort.    Comments nurse may pronounce      DO NOT RESUSCITATE    TOTAL TIME TAKING CARE OF THIS PATIENT:  50 minutes.    Vaughan Basta M.D on 01/13/2017   Between 7am to 6pm - Pager - [phone]  After 6pm go to [email or web address] - password [email or web address] [facility]  Office  [phone]  CC: Primary care physician; Roselee Nova, MD   Note: This  dictation was prepared with Dragon dictation along with smaller phrase technology. Any transcriptional errors that result from this process are unintentional.

## 2017-01-13 NOTE — Assessment & Plan Note (Signed)
Foot exam by MD today; sugars are uncontrolled, 300s and 400s; discussed increasing long-acting insulin from 15 units daily to 20 units daily x 3 days, then going up another 5 units to 25 units if needed after another 3 days; this was decided upon BEFORE finalizing our plan with Dr. Rogue Bussing to send her to the hospital, however, so the hospital may choose to adjust her insulin as she or he sees fit; infection may also be causing her sugars to spike as well, though we reviewed her WBC which had been coming down

## 2017-01-13 NOTE — ED Provider Notes (Signed)
[facility] Emergency Department Provider Note   ____________________________________________    I have reviewed the triage vital signs and the nursing notes.   HISTORY  Chief Complaint Weakness     HPI Mackenzie Park is a 71 y.o. female who presents with complaints of fluid overload and weakness. Patient reports diffuse weakness and shortness of breath when she lies flat. She reports she was sent in by her PCP for diuresis and admission. She denies chest pain. She denies cough. She is unable to ambulate on her own, family reports she has required 24-hour care and they are not capable of providing this. She reports her edema is more severe than it has ever been.   Past Medical History:  Diagnosis Date  . Allergic rhinitis   . Cancer ([place])    liver  . CHF (congestive heart failure) ([place])   . Depressive disorder   . Diabetes mellitus ([place])   . Hypercholesteremia   . Hypertension   . Lumbosacral neuritis   . Osteoarthritis   . Tumor liver     Patient Active Problem List   Diagnosis Date Noted  . Hypokalemia 01/13/2017  . Iron deficiency anemia due to chronic blood loss 12/28/2016  . Acute blood loss anemia 12/27/2016  . Pneumonia 12/20/2016  . Abnormal LFTs 12/12/2016  . Acute on chronic diastolic CHF (congestive heart failure) ([place]) 12/12/2016  . CHF exacerbation ([place]) 12/12/2016  . Abnormal liver function   . Hyperglycemia   . Transaminitis   . Weakness   . 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 Abnormal finding on thyroid function test 02/05/2015  . Breast screening 02/05/2015  . Cervical disc disease 02/05/2015  . Pain in shoulder 02/05/2015  . DDD (degenerative disc disease), lumbar 02/05/2015  . Dizziness 02/05/2015  . Neuropathy 02/05/2015  . Extreme obesity 02/05/2015  . Stasis, venous 02/05/2015  . Obesity 12/08/2014  . Diabetic neuropathy ([place]) 12/08/2014  . Hypertension 12/08/2014  . Hyperlipidemia  12/08/2014  . Infraspinatus tenosynovitis 09/18/2014  . Other synovitis and tenosynovitis, right shoulder 09/18/2014  . Allergic rhinitis, seasonal 01/09/2014  . Clinical depression 11/09/2009  . Osteoarthrosis 12/19/2008  . Type II diabetes mellitus, uncontrolled ([place]) 06/18/2007    Past Surgical History:  Procedure Laterality Date  . 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 at [facility].   . CHOLECYSTECTOMY    . REPLACEMENT TOTAL KNEE BILATERAL    . TUBAL LIGATION      Prior to Admission medications   Medication Sig Start Date End Date Taking? Authorizing Provider  furosemide (LASIX) 20 MG tablet Take 1 tablet (20 mg total) by mouth daily. 12/20/16  Yes Arrien, Jimmy Picket, MD  metoprolol tartrate (LOPRESSOR) 25 MG tablet Take 0.5 tablets (12.5 mg total) by mouth 2 (two) times daily. 12/19/16  Yes Arrien, Jimmy Picket, MD  ACCU-CHEK AVIVA PLUS test strip  12/13/16   [provider]  ACCU-CHEK SOFTCLIX LANCETS lancets  12/22/16   [provider]  Blood Glucose Monitoring Suppl (ACCU-CHEK AVIVA PLUS) w/Device KIT  12/22/16   [provider]  feeding supplement, GLUCERNA SHAKE, (GLUCERNA SHAKE) LIQD Take 237 mLs by mouth 2 (two) times daily between meals. 12/23/16   Fritzi Mandes, MD  fluticasone (FLONASE) 50 MCG/ACT nasal spray Place 1-2 sprays into both nostrils daily as needed for allergies.     [provider]  gabapentin (NEURONTIN) 300 MG capsule Take 1 capsule (300 mg total) by mouth at bedtime. 11/08/16  Roselee Nova, MD  insulin glargine (LANTUS) 100 UNIT/ML injection Inject 0.2 mLs (20 Units total) into the skin at bedtime. Increase to 25 units after 3 days if sugars not under 200 01/13/17   Lada, Satira Anis, MD  iron polysaccharides (NIFEREX) 150 MG capsule Take 1 capsule (150 mg total) by mouth daily. 12/23/16   Fritzi Mandes, MD  meloxicam (MOBIC) 15 MG tablet Take 15 mg by mouth daily.  11/09/16   [provider]  Multiple  Vitamin (MULTIVITAMIN WITH MINERALS) TABS tablet Take 1 tablet by mouth daily. 12/23/16   Fritzi Mandes, MD  pantoprazole (PROTONIX) 40 MG tablet Take 1 tablet (40 mg total) by mouth 2 (two) times daily before a meal. 12/23/16   Fritzi Mandes, MD  potassium chloride SA (K-DUR,KLOR-CON) 20 MEQ tablet Take 1 tablet (20 mEq total) by mouth 2 (two) times daily. 01/05/17   Cammie Sickle, MD  sitaGLIPtin (JANUVIA) 100 MG tablet Take 1 tablet (100 mg total) by mouth daily. 11/16/16   Roselee Nova, MD  sucralfate (CARAFATE) 1 GM/10ML suspension Take 10 mLs (1 g total) by mouth 4 (four) times daily -  with meals and at bedtime. 12/23/16   Fritzi Mandes, MD     Allergies Sulfa antibiotics and Other  Family History  Problem Relation Age of Onset  . Dementia Father   . COPD Sister   . Breast cancer Sister   . Lung cancer Sister   . Lung cancer Mother   . Breast cancer Mother   . Heart failure Brother   . Kidney failure Brother     Social History Social History  Substance Use Topics  . Smoking status: Former Smoker    Packs/day: 1.00    Years: 15.00    Types: Cigarettes    Quit date: 08/20/1995  . Smokeless tobacco: Never Used  . Alcohol use No    Review of Systems  Constitutional: No fever/chills Eyes: No visual changes.  ENT: No sore throat. Cardiovascular: Denies chest pain. Respiratory: Shortness of breath when supine Gastrointestinal: No abdominal pain.  No nausea, no vomiting.   Genitourinary: Negative for dysuria. Musculoskeletal: Negative for back pain. Skin: Negative for rash. Neurological: Negative for headaches   ____________________________________________   PHYSICAL EXAM:  VITAL SIGNS: ED Triage Vitals  Enc Vitals Group     BP 01/13/17 1624 (!) 97/32     Pulse Rate 01/13/17 1624 90     Resp 01/13/17 1624 16     Temp 01/13/17 1624 98.6 F (37 C)     Temp Source 01/13/17 1624 Oral     SpO2 01/13/17 1624 96 %     Weight 01/13/17 1624 223 lb (101.2 kg)      Height 01/13/17 1624 _0  (1.676 m)     Head Circumference --      Peak Flow --      Pain Score 01/13/17 1623 0     Pain Loc --      Pain Edu? --      Excl. in [place]? --     Constitutional: Alert and oriented. No acute distress.  Eyes: Conjunctivae are normal.  Head: Atraumatic. Nose: No congestion/rhinnorhea. Mouth/Throat: Mucous membranes are moist.    Cardiovascular: Normal rate, regular rhythm. Grossly normal heart sounds.  Good peripheral circulation. Respiratory: Normal respiratory effort.  No retractions.Bibasilar rales Gastrointestinal: Soft and nontender. No distention.  No CVA tenderness. Genitourinary: deferred Musculoskeletal: 3+ edema bilaterally.  Warm and well perfused Neurologic:  Normal speech and language. No gross focal neurologic deficits are appreciated.  Skin:  Skin is warm, dry and intact. No rash noted. Psychiatric: Mood and affect are normal. Speech and behavior are normal.  ____________________________________________   LABS (all labs ordered are listed, but only abnormal results are displayed)  Labs Reviewed  BASIC METABOLIC PANEL - Abnormal; Notable for the following:       Result Value   Chloride 93 (*)    Glucose, Bld 288 (*)    BUN 22 (*)    All other components within normal limits  CBC - Abnormal; Notable for the following:    WBC 13.5 (*)    RBC 3.13 (*)    Hemoglobin 9.5 (*)    HCT 29.1 (*)    RDW 22.6 (*)    All other components within normal limits  GLUCOSE, CAPILLARY - Abnormal; Notable for the following:    Glucose-Capillary 256 (*)    All other components within normal limits  BRAIN NATRIURETIC PEPTIDE - Abnormal; Notable for the following:    B Natriuretic Peptide 163.0 (*)    All other components within normal limits  CULTURE, BLOOD (ROUTINE X 2)  CULTURE, BLOOD (ROUTINE X 2)  URINALYSIS, COMPLETE (UACMP) WITH MICROSCOPIC  CBG MONITORING, ED   ____________________________________________  EKG  ED ECG REPORT I, Lavonia Drafts, the attending physician, personally viewed and interpreted this ECG.  Date: 01/13/2017 EKG Time: 4:28 PM Rate: 89 Rhythm: normal sinus rhythm QRS Axis: normal Intervals: normal ST/T Wave abnormalities: normal Conduction Disturbances: none   ____________________________________________  RADIOLOGY  Chest x-ray shows possible pneumonia ____________________________________________   PROCEDURES  Procedure(s) performed: No    Critical Care performed: No ____________________________________________   INITIAL IMPRESSION / ASSESSMENT AND PLAN / ED COURSE  Pertinent labs & imaging results that were available during my care of the patient were reviewed by me and considered in my medical decision making (see chart for details).  Patient comes in with diffuse weakness. She certainly fluid overloaded with significant edema. She also has shortness of breath with lying flat. Chest x-ray is concerning for possible pneumonia and given her elevated white blood cell count and weakness I will treat with antibiotics. I'll admit her for diuresis and further management.    ____________________________________________   FINAL CLINICAL IMPRESSION(S) / ED DIAGNOSES  Final diagnoses:  Community acquired pneumonia of left lower lobe of lung ([place])  Acute on chronic congestive heart failure, unspecified heart failure type ([place])  Weakness      NEW MEDICATIONS STARTED DURING THIS VISIT:  New Prescriptions   No medications on file     Note:  This document was prepared using Dragon voice recognition software and may include unintentional dictation errors.    Lavonia Drafts, MD 01/13/17 [phone]

## 2017-01-13 NOTE — Telephone Encounter (Signed)
Mackenzie Cull, RN with [facility]. Explained to her that [name], NP in palliative care visited the patient on 01/11/17. Pt didn't want hospice at that time per palliative care notes until PT has been completed. Called to clarify with RN if status and decisions had changed. Per [name], PT visited the patient today and pt no longer desires PT and would like to initiate hospice services.  Per home health nurse, pt's Blood sugar has been elevated and pt's feet continue to have pitting edema. Pt was referred to [name] scheduled today for further recommendations of uncontrolled blood sugars.  I personally spoke with [name] agreeable to sign hospice orders.

## 2017-01-13 NOTE — Assessment & Plan Note (Signed)
Related to liver metastases, suspect lung cancer as primary

## 2017-01-13 NOTE — ED Notes (Addendum)
Pt assisted to bedside commode. Pt needs 2-3 person assist.  Unable to catch a urine sample.

## 2017-01-13 NOTE — ED Notes (Signed)
X-ray at bedside

## 2017-01-13 NOTE — Assessment & Plan Note (Signed)
4+ pitting edema

## 2017-01-13 NOTE — Progress Notes (Signed)
Family Meeting Note  Advance Directive:yes  Today a meeting took place with the Patient.  The following clinical team members were present during this meeting:MD  The following were discussed:Patient's diagnosis: metastatic cancer, anemia , Patient's progosis: Unable to determine and Goals for treatment: DNR  Additional follow-up to be provided: palliative care  Time spent during discussion:20 minutes  Natilee Gauer, Rosalio Macadamia, MD

## 2017-01-13 NOTE — Telephone Encounter (Signed)
-----   Message from Manus Rudd, RN sent at 01/13/2017 11:07 AM EDT ----- Regarding: Hospice Referral [facility] would like a Hospice Referral for this patient.  [place]

## 2017-01-13 NOTE — ED Triage Notes (Signed)
Pt states increased weakness and hyperglycemia, states she is a cancer pt, liver and lung, states hx of CHF with leg swelling, pt states she was sent to ED from PCP for admission and SNF placement, awake and alert

## 2017-01-13 NOTE — ED Notes (Signed)
Dr Kinner at bedside. 

## 2017-01-13 NOTE — ED Notes (Signed)
Report to Rachel, RN.

## 2017-01-13 NOTE — Progress Notes (Signed)
BP 124/72   Pulse 90   Temp 98.6 F (37 C) (Oral)   Resp 16   SpO2 98%    Subjective:    Patient ID: Mackenzie Park, female    DOB: 09-Feb-1946, 71 y.o.   MRN: [id number]  HPI: Mackenzie Park is a 71 y.o. female  Chief Complaint  Patient presents with  . Diabetes    elevated sugar   HPI Patient is brand new to me; her usual provider is out of the office  The appointment was made because of very high blood sugars, 300s and 400s in the last week Blood sugars running up lately and they don't know why She is on Januvia 100 mg daily, as well as 15 units of long-acting insulin every night No nausea Checking sugars every morning Lowest got down to 195, but creeping up now 295 this morning, 328, 345 the day before 269 here today POCT No recent prednisone, no recent infections in the last week Last A1c was 7.4  Dry mouth and blurred vision Patient is here with niece They had talked with therapist about going to skilled facility She was understanding that the cancer was in the  Legs are very weak, got too close to the edge of the bed and slid to the floor; lmoast 3 am and they had to call paramedics to get her back in bed She cannot get off the toilet PT is coming 1 hour a day a few days a week Getting weaker and weaker They would like to get her in rehab to strengthen her legs She cannot control her bowels; huge accident and couldn't control her bowels She is Jehovah's witness; does not want blood products Wonders what she can do, but knows she cannot stay at home like this She is not taking meloxicam; we reviewed her medicines She is requiring around the clock care Poor appetite; one story; no bedside commode; has shower chair Has a bed that lifts her legs, but it is so high, cannot get up into it  We reviewed her recent health issues; she was found to have 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 Aletha Halim note mentions IV iron infusions if needed palliatively for anemia  Depression screen [facility] 2/9 01/13/2017 01/04/2017 12/29/2016 12/26/2016 12/26/2016  Decreased Interest 0 0 0 0 0  Down, Depressed, Hopeless 0 0 0 0 0  PHQ - 2 Score 0 0 0 0 0    Relevant past medical, surgical, family and social history reviewed Past Medical History:  Diagnosis Date  . Allergic rhinitis   . Cancer ([place])    liver  . CHF (congestive heart failure) ([place])   . Depressive disorder   . Diabetes mellitus ([place])   . Hypercholesteremia   . Hypertension   . Lumbosacral neuritis   . Osteoarthritis   . Tumor liver    Past Surgical History:  Procedure Laterality Date  . 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 at [facility].   . CHOLECYSTECTOMY    . REPLACEMENT TOTAL KNEE BILATERAL    . TUBAL LIGATION     Family History  Problem Relation Age of Onset  . Dementia Father   . COPD Sister   . Breast cancer Sister   .  Lung cancer Sister   . Lung cancer Mother   . Breast cancer Mother   . Heart failure Brother   . Kidney failure Brother    Social History   Social History  . Marital status: Single    Spouse name: N/A  . Number of children: N/A  . Years of education: N/A   Occupational History  . Not on file.   Social History Main Topics  . Smoking status: Former Smoker    Packs/day: 1.00    Years: 15.00    Types: Cigarettes    Quit date: 08/20/1995  . Smokeless tobacco: Never Used  . Alcohol use No  . Drug use: No  . Sexual activity: Not on file   Other Topics Concern  . 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   She is single. She has no children. She has a support system specifically with her niece.    Interim medical history  since last visit reviewed. Allergies and medications reviewed  Review of Systems Per HPI unless specifically indicated above     Objective:    BP 124/72   Pulse 90   Temp 98.6 F (37 C) (Oral)   Resp 16   SpO2 98%   Wt Readings from Last 3 Encounters:  01/12/17 223 lb (101.2 kg)  12/29/16 228 lb (103.4 kg)  12/28/16 227 lb 14.4 oz (103.4 kg)    Physical Exam  Constitutional: She appears well-developed.  Older female seated in wheelchair, appears weak, debilitated; obese  Cardiovascular: Normal rate and regular rhythm.   Pulmonary/Chest: Effort normal and breath sounds normal.  Musculoskeletal: She exhibits edema (4+ pitting edema to the knees).  Neurological:  Appears tired, weak; keeps eyes closed during some of the visit; gait not assessed, not able to stand to obtain weight  Psychiatric:  Good eye contact with examiner   Diabetic Foot Form - Detailed   Diabetic Foot Exam - detailed Diabetic Foot exam was performed with the following findings:  Yes 01/13/2017  4:10 PM  Visual Foot Exam completed.:  Yes  Pulse Foot Exam completed.:  Yes  Right Dorsalis Pedis:  Diminished Left Dorsalis Pedis:  Diminished  Sensory Foot Exam Completed.:  Yes Swelling:  Yes (Comment: 4+ pitting edema) Semmes-Weinstein Monofilament Test R Site 1-Great Toe:  Pos L Site 1-Great Toe:  Pos  R Site 4:  Pos L Site 4:  Pos  R Site 5:  Pos L Site 5:  Pos        Results for orders placed or performed in visit on 01/13/17  POCT Glucose (CBG)  Result Value Ref Range   POC Glucose 269 (A) 70 - 99 mg/dl      Assessment & Plan:   Problem List Items Addressed This Visit      Cardiovascular and Mediastinum   Acute on chronic diastolic CHF (congestive heart failure) ([facility])    4+ pitting edema        Endocrine   Type II diabetes mellitus, uncontrolled ([place])    Foot exam by MD today; sugars are uncontrolled, 300s and 400s; discussed increasing long-acting insulin from 15 units daily to 20  units daily x 3 days, then going up another 5 units to 25 units if needed after another 3 days; this was decided upon BEFORE finalizing our plan with Dr. Rogue Bussing to send her to the hospital, however, so  the hospital may choose to adjust her insulin as she or he sees fit; infection may also be causing her sugars to spike as well, though we reviewed her WBC which had been coming down      Relevant Medications   insulin glargine (LANTUS) 100 UNIT/ML injection     Other   Iron deficiency anemia due to chronic blood loss    Patient is Jehovah's Witness, so NO BLOOD PRODUCTS; may use IV iron for palliative treatment per oncologist's note      Hypokalemia    Noted on recent labs; potassium intake had been doubled by oncologist; likely contributing to weakness      Abnormal LFTs    Related to liver metastases, suspect lung cancer as primary       Other Visit Diagnoses    High blood sugar    -  Primary   very high sugars, in the 300s and 400s; her oncologist agrees with sending her to the ER for evaluation, admission by hospitalist to stabilize, then pall care   Relevant Orders   POCT Glucose (CBG) (Completed)   Weakness generalized       patient too weak to get in and out of bed, defecating on herself; illness affecting her significantly, requires nursing care; discussed w/oncology; to hospital       Follow up plan: No Follow-up on file.  An after-visit summary was printed and given to the patient at [place].  Please see the patient instructions which may contain other information and recommendations beyond what is mentioned above in the assessment and plan.  Meds ordered this encounter  Medications  . insulin glargine (LANTUS) 100 UNIT/ML injection    Sig: Inject 0.2 mLs (20 Units total) into the skin at bedtime. Increase to 25 units after 3 days if sugars not under 200    Dispense:  10 mL    Refill:  11    Orders Placed This Encounter  Procedures  . POCT Glucose (CBG)

## 2017-01-13 NOTE — ED Notes (Signed)
Report to [place], [place]

## 2017-01-13 NOTE — Assessment & Plan Note (Signed)
Patient is Jehovah's Witness, so NO BLOOD PRODUCTS; may use IV iron for palliative treatment per oncologist's note

## 2017-01-13 NOTE — ED Notes (Addendum)
Pt placed on 2L . Oxygen now 96% on 2L.

## 2017-01-13 NOTE — Assessment & Plan Note (Signed)
Noted on recent labs; potassium intake had been doubled by oncologist; likely contributing to weakness

## 2017-01-14 DIAGNOSIS — L8990 Pressure ulcer of unspecified site, unspecified stage: Secondary | ICD-10-CM | POA: Insufficient documentation

## 2017-01-14 LAB — URINALYSIS, COMPLETE (UACMP) WITH MICROSCOPIC
BACTERIA UA: NONE SEEN
BILIRUBIN URINE: NEGATIVE
Glucose, UA: 50 mg/dL — AB
Hgb urine dipstick: NEGATIVE
Ketones, ur: NEGATIVE mg/dL
Nitrite: NEGATIVE
PH: 5 (ref 5.0–8.0)
Protein, ur: NEGATIVE mg/dL
SPECIFIC GRAVITY, URINE: 1.006 (ref 1.005–1.030)

## 2017-01-14 LAB — BASIC METABOLIC PANEL
Anion gap: 12 (ref 5–15)
BUN: 23 mg/dL — ABNORMAL HIGH (ref 6–20)
CHLORIDE: 92 mmol/L — AB (ref 101–111)
CO2: 32 mmol/L (ref 22–32)
Calcium: 8.8 mg/dL — ABNORMAL LOW (ref 8.9–10.3)
Creatinine, Ser: 0.84 mg/dL (ref 0.44–1.00)
GFR calc non Af Amer: >60 mL/min (ref >60)
Glucose, Bld: 319 mg/dL — ABNORMAL HIGH (ref 65–99)
POTASSIUM: 4 mmol/L (ref 3.5–5.1)
Sodium: 136 mmol/L (ref 135–145)

## 2017-01-14 LAB — GLUCOSE, CAPILLARY
GLUCOSE-CAPILLARY: 308 mg/dL — AB (ref 65–99)
GLUCOSE-CAPILLARY: 269 mg/dL — AB (ref 65–99)
GLUCOSE-CAPILLARY: 281 mg/dL — AB (ref 65–99)
Glucose-Capillary: 235 mg/dL — ABNORMAL HIGH (ref 65–99)
Glucose-Capillary: 315 mg/dL — ABNORMAL HIGH (ref 65–99)

## 2017-01-14 LAB — CBC
HEMATOCRIT: 28.2 % — AB (ref 35.0–47.0)
Hemoglobin: 9.2 g/dL — ABNORMAL LOW (ref 12.0–16.0)
MCH: 30.8 pg (ref 26.0–34.0)
MCHC: 32.6 g/dL (ref 32.0–36.0)
MCV: 94.4 fL (ref 80.0–100.0)
PLATELETS: 189 10*3/uL (ref 150–440)
RBC: 2.98 MIL/uL — AB (ref 3.80–5.20)
RDW: 22.7 % — ABNORMAL HIGH (ref 11.5–14.5)
WBC: 12.9 10*3/uL — AB (ref 3.6–11.0)

## 2017-01-14 MED ORDER — AZITHROMYCIN 500 MG PO TABS
250.0000 mg | ORAL_TABLET | Freq: Every day | ORAL | Status: DC
  Administered 2017-01-14 – 2017-01-17 (×4): 250 mg via ORAL
  Filled 2017-01-14 (×4): qty 1

## 2017-01-14 MED ORDER — ORAL CARE MOUTH RINSE
15.0000 mL | Freq: Two times a day (BID) | OROMUCOSAL | Status: DC
  Administered 2017-01-14 – 2017-01-18 (×10): 15 mL via OROMUCOSAL

## 2017-01-14 MED ORDER — DEXTROSE 5 % IV SOLN
1.0000 g | INTRAVENOUS | Status: DC
  Administered 2017-01-14: 1 g via INTRAVENOUS
  Filled 2017-01-14 (×2): qty 10

## 2017-01-14 NOTE — Progress Notes (Signed)
[facility] [place] NAME: Mackenzie Park    MR#:  [id number]  DATE OF BIRTH:  [birth year]  SUBJECTIVE:   Came in with overall decline at home with severe weakness and unable to care for herself and total care with family. Follow-up of elevated white count and possible new small patchy pneumonia on antibiotics. Okay. REVIEW OF SYSTEMS:   Review of Systems  Constitutional: Negative for chills, fever and weight loss.  HENT: Negative for ear discharge, ear pain and nosebleeds.   Eyes: Negative for blurred vision, pain and discharge.  Respiratory: Negative for sputum production, shortness of breath, wheezing and stridor.   Cardiovascular: Positive for orthopnea, leg swelling and PND. Negative for chest pain and palpitations.  Gastrointestinal: Negative for abdominal pain, diarrhea, nausea and vomiting.  Genitourinary: Negative for frequency and urgency.  Musculoskeletal: Negative for back pain and joint pain.  Neurological: Positive for weakness. Negative for sensory change, speech change and focal weakness.  Psychiatric/Behavioral: Negative for depression and hallucinations. The patient is not nervous/anxious.    Tolerating Diet: Yes Tolerating PT: Pending  DRUG ALLERGIES:   Allergies  Allergen Reactions  . Sulfa Antibiotics Anaphylaxis and Hives  . Other Nausea And Vomiting    Milk products    VITALS:  Blood pressure (!) 114/46, pulse 79, temperature 98.8 F (37.1 C), temperature source Oral, resp. rate 16, height '5\' 6"'$  (1.676 m), weight 101.2 kg (223 lb), SpO2 97 %.  PHYSICAL EXAMINATION:   Physical Exam  GENERAL:  71 y.o.-year-old patient lying in the bed with no acute distress. Obese EYES: Pupils equal, round, reactive to light and accommodation. No scleral icterus. Extraocular muscles intact.  HEENT: Head atraumatic, normocephalic. Oropharynx and nasopharynx clear.  NECK:  Supple, no jugular venous distention. No thyroid  enlargement, no tenderness.  LUNGS: Normal breath sounds bilaterally, no wheezing, rales, rhonchi. No use of accessory muscles of respiration.  CARDIOVASCULAR: S1, S2 normal. No murmurs, rubs, or gallops.  ABDOMEN: Soft, nontender, nondistended. Bowel sounds present. No organomegaly or mass.  EXTREMITIES: No cyanosis, clubbing o - chronic 2+ edema b/l.    NEUROLOGIC: Cranial nerves II through XII are intact. No focal Motor or sensory deficits b/l.   PSYCHIATRIC:  patient is alert and oriented x 3.  SKIN: No obvious rash, lesion, or ulcer.   LABORATORY PANEL:  CBC  Recent Labs Lab 01/14/17 0442  WBC 12.9*  HGB 9.2*  HCT 28.2*  PLT 189    Chemistries   Recent Labs Lab 01/14/17 0442  NA 136  K 4.0  CL 92*  CO2 32  GLUCOSE 319*  BUN 23*  CREATININE 0.84  CALCIUM 8.8*   Cardiac Enzymes No results for input(s): TROPONINI in the last 168 hours. RADIOLOGY:  Dg Chest 1 View  Result Date: 01/13/2017 CLINICAL DATA:  Increasing weakness.  Shortness of breath. EXAM: CHEST 1 VIEW COMPARISON:  Radiograph 12/20/2016, CT 12/15/2016 FINDINGS: Lower lung volumes from prior. Heart size and mediastinal contours are unchanged allowing for differences in technique. Known right hilar mass not well characterized radiographically. Associated postobstructive collapse in the right perihilar lung (upper lobe on CT). There is a new medial retrocardiac opacity at the left lung base. No pulmonary edema. Possible trace left pleural effusion. No acute osseous abnormalities. IMPRESSION: Retrocardiac left basilar opacity with small pleural effusion. This may be atelectasis or pneumonia. Partial right upper lobe collapse secondary to known right hilar mass/adenopathy, unchanged. Electronically Signed   By: Jeb Levering  M.D.   On: 01/13/2017 18:45   ASSESSMENT AND PLAN:   Mackenzie Park  is a 71 y.o. female with a known history of Metastatic cancer, CHF, depression, hypertension, hypercholesterolemia,  diabetes- was admitted in hospital for bleeding issues and blood loss anemia 3 weeks ago was diagnosed to have metastatic cancer suspected lung cancer. But she continued to get significantly worse in her strength and she need complete care now by her sister and family.  * Pneumonia   We will give ceftriaxone and azithromycin and follow clinically. -change to oral abxs in am Wbc improving No fever  * Bilateral pedal edema   Combination of immobility, metastatic cancer, CHF   Give IV Lasix at higher dose but patient is agreeing now for hospice services so we will wait for palliative care.  * Metastatic cancerSuspected lung cancer given lung mass  -Patient sees Dr. Yevette Edwards at the [facility] and according to his notes patient has decided not to pursue any aggressive workup including biopsy. -palliative care to see him Monday   * Anemia due to blood loss and cancer   Currently stable with oral iron therapy, continue monitoring. -Hemoglobin much improved with IV and oral iron -hgb is 9.5  * Generalized weakness and severe deconditioning. Patient during her admission in April 2018 was recommended to go to rehabilitation where she declined and went home with family. She is been overall very weak and now is agreeable for rehabilitation. We'll get physical therapy to see patient.   Case discussed with Care Management/Social Worker. Management plans discussed with the patient, family and they are in agreement.  CODE STATUS: DNR  DVT Prophylaxis: heparin  TOTAL TIME TAKING CARE OF THIS PATIENT: 30 minutes.  >50% time spent on counselling and coordination of care  POSSIBLE D/C IN 1-2 DAYS, DEPENDING ON CLINICAL CONDITION.  Note: This dictation was prepared with Dragon dictation along with smaller phrase technology. Any transcriptional errors that result from this process are unintentional.  Suzane Vanderweide M.D on 01/14/2017 at 11:17 AM  Between 7am to 6pm - Pager -  [phone]  After 6pm go to [email or web address] - password [email or web address] [facility]  Office  [phone]  CC: Primary care physician; Roselee Nova, MD

## 2017-01-14 NOTE — NC FL2 (Signed)
[place] LEVEL OF CARE SCREENING TOOL     IDENTIFICATION  Patient Name: Mackenzie Park Birthdate: 02-24-1946 Sex: female Admission Date (Current Location): 01/13/2017  [place] and [place] Number:  Engineering geologist and Address:  [facility], [address], [place], [place] [address]      Provider Number: [id number]  Attending Physician Name and Address:  Fritzi Mandes, MD  Relative Name and Phone Number:       Current Level of Care: Hospital Recommended Level of Care: [place] Prior Approval Number:    Date Approved/Denied:   PASRR Number: [id number]  Discharge Plan: SNF    Current Diagnoses: Patient Active Problem List   Diagnosis Date Noted  . Pressure injury of skin 01/14/2017  . Hypokalemia 01/13/2017  . Pneumonia 01/13/2017  . Iron deficiency anemia due to chronic blood loss 12/28/2016  . Acute blood loss anemia 12/27/2016  . Community acquired pneumonia 12/20/2016  . Abnormal LFTs 12/12/2016  . Acute on chronic diastolic CHF (congestive heart failure) ([place]) 12/12/2016  . CHF exacerbation ([place]) 12/12/2016  . Abnormal liver function   . Hyperglycemia   . Transaminitis   . Weakness   . 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 Abnormal finding on thyroid function test 02/05/2015  . Breast screening 02/05/2015  . Cervical disc disease 02/05/2015  . Pain in shoulder 02/05/2015  . DDD (degenerative disc disease), lumbar 02/05/2015  . Dizziness 02/05/2015  . Neuropathy 02/05/2015  . Extreme obesity 02/05/2015  . Stasis, venous 02/05/2015  . Obesity 12/08/2014  . Diabetic neuropathy ([place]) 12/08/2014  . Hypertension 12/08/2014  . Hyperlipidemia 12/08/2014  . Infraspinatus tenosynovitis 09/18/2014  . Other synovitis and tenosynovitis, right shoulder 09/18/2014  . Allergic rhinitis, seasonal 01/09/2014  . Clinical depression 11/09/2009  . Osteoarthrosis 12/19/2008  . Type II diabetes  mellitus, uncontrolled ([place]) 06/18/2007    Orientation RESPIRATION BLADDER Height & Weight     Self  O2 (2L o2) Continent Weight: 223 lb (101.2 kg) Height:  '5\' 6"'$  (167.6 cm)  BEHAVIORAL SYMPTOMS/MOOD NEUROLOGICAL BOWEL NUTRITION STATUS      Continent Diet (Heart healthy)  AMBULATORY STATUS COMMUNICATION OF NEEDS Skin   Extensive Assist Verbally Normal                       Personal Care Assistance Level of Assistance  Bathing, Feeding, Dressing Bathing Assistance: Limited assistance Feeding assistance: Independent Dressing Assistance: Limited assistance     Functional Limitations Info             SPECIAL CARE FACTORS FREQUENCY  PT (By licensed PT)     PT Frequency: Up to 5X per day, 5 days per week              Contractures Contractures Info: Present    Additional Factors Info  Code Status, Allergies Code Status Info: DNR Allergies Info: Sulfa Antibiotics, Other (milk products)           Current Medications (01/14/2017):  This is the current hospital active medication list Current Facility-Administered Medications  Medication Dose Route Frequency Provider Last Rate Last Dose  . azithromycin (ZITHROMAX) tablet 250 mg  250 mg Oral Daily Fritzi Mandes, MD      . cefTRIAXone (ROCEPHIN) 1 g in dextrose 5 % 50 mL IVPB  1 g Intravenous Q24H Fritzi Mandes, MD      . docusate sodium (COLACE) capsule 100 mg  100 mg Oral BID PRN Anselm Jungling,  Rosalio Macadamia, MD      . feeding supplement (GLUCERNA SHAKE) (GLUCERNA SHAKE) liquid 237 mL  237 mL Oral BID BM Vaughan Basta, MD   237 mL at 01/14/17 0748  . fluticasone (FLONASE) 50 MCG/ACT nasal spray 1-2 spray  1-2 spray Each Nare Daily PRN Vaughan Basta, MD      . furosemide (LASIX) injection 20 mg  20 mg Intravenous Q12H Vaughan Basta, MD   20 mg at 01/14/17 0746  . gabapentin (NEURONTIN) capsule 300 mg  300 mg Oral QHS Vaughan Basta, MD   300 mg at 01/14/17 0047  . heparin injection 5,000  Units  5,000 Units Subcutaneous Q8H Vaughan Basta, MD   5,000 Units at 01/14/17 0550  . insulin aspart (novoLOG) injection 0-9 Units  0-9 Units Subcutaneous TID WC Vaughan Basta, MD   7 Units at 01/14/17 0747  . insulin glargine (LANTUS) injection 20 Units  20 Units Subcutaneous QHS Vaughan Basta, MD   20 Units at 01/14/17 0147  . iron polysaccharides (NIFEREX) capsule 150 mg  150 mg Oral Daily Vaughan Basta, MD   150 mg at 01/14/17 0746  . linagliptin (TRADJENTA) tablet 5 mg  5 mg Oral Daily Vaughan Basta, MD   5 mg at 01/14/17 0746  . MEDLINE mouth rinse  15 mL Mouth Rinse BID Vaughan Basta, MD   15 mL at 01/14/17 0746  . meloxicam (MOBIC) tablet 15 mg  15 mg Oral Daily Vaughan Basta, MD   15 mg at 01/14/17 0746  . metoprolol tartrate (LOPRESSOR) tablet 12.5 mg  12.5 mg Oral BID Vaughan Basta, MD   12.5 mg at 01/14/17 0746  . multivitamin with minerals tablet 1 tablet  1 tablet Oral Daily Vaughan Basta, MD   1 tablet at 01/14/17 0746  . pantoprazole (PROTONIX) EC tablet 40 mg  40 mg Oral BID AC Vaughan Basta, MD   40 mg at 01/14/17 0746  . potassium chloride SA (K-DUR,KLOR-CON) CR tablet 20 mEq  20 mEq Oral BID Vaughan Basta, MD   20 mEq at 01/14/17 0746  . sucralfate (CARAFATE) 1 GM/10ML suspension 1 g  1 g Oral TID WC & HS Vaughan Basta, MD   1 g at 01/14/17 0746     Discharge Medications: Please see discharge summary for a list of discharge medications.  Relevant Imaging Results:  Relevant Lab Results:   Additional Information SS# [id number]  Zettie Pho, LCSW

## 2017-01-14 NOTE — Clinical Social Work Note (Signed)
Clinical Social Work Assessment  Patient Details  Name: Mackenzie Park MRN: [id number] Date of Birth: December 19, 1945  Date of referral:  01/14/17               Reason for consult:  Facility Placement                Permission sought to share information with:  Facility Art therapist granted to share information::  Yes, Verbal Permission Granted  Name::        Agency::     Relationship::     Contact Information:     Housing/Transportation Living arrangements for the past 2 months:  Single Family Home Source of Information:  Patient, Medical Team Patient Interpreter Needed:  None Criminal Activity/Legal Involvement Pertinent to Current Situation/Hospitalization:  No - Comment as needed Significant Relationships:  Other Family Members Lives with:  Self Do you feel safe going back to the place where you live?  Yes Need for family participation in patient care:  No (Coment)  Care giving concerns:  PT recommendation for STR   Social Worker assessment / plan:  CSW met with the patient at bedside to discuss discharge planning. The patient gave verbal permission to conduct a bed referral, and she noted that her preference is [facility] as her brother resides in that facility at this time. The patient indicated that she is not interested in [facility].  At baseline, the patient lives alone; however, she receives support from her two nieces. In the past 2 weeks, the patient reports becoming much weaker. The patient seems to understand that palliative care may become involved, and she mentioned hospice. The patient may require palliative to follow her at SNF with the possibility of beginning hospice at home once she leaves STR.   CSW has begun the referral process and will follow up with bed offers.  Employment status:  Retired Nurse, adult PT Recommendations:  [place] / Referral to community  resources:  [place]  Patient/Family's Response to care:  The patient thanked the CSW for assistance.  Patient/Family's Understanding of and Emotional Response to Diagnosis, Current Treatment, and Prognosis:  The patient understands that her level of care needs are changing, and she is in agreement with the plan.  Emotional Assessment Appearance:  Appears stated age Attitude/Demeanor/Rapport:   (Pleasant) Affect (typically observed):  Appropriate, Accepting, Pleasant Orientation:  Oriented to Self, Oriented to Place, Oriented to  Time, Oriented to Situation Alcohol / Substance use:  Never Used Psych involvement (Current and /or in the community):  No (Comment)  Discharge Needs  Concerns to be addressed:  Discharge Planning Concerns, Care Coordination Readmission within the last 30 days:  Yes Current discharge risk:  Chronically ill Barriers to Discharge:  Continued Medical Work up   [employer], LCSW 01/14/2017, 2:47 PM

## 2017-01-14 NOTE — Clinical Social Work Placement (Signed)
   CLINICAL SOCIAL WORK PLACEMENT  NOTE  Date:  01/14/2017  Patient Details  Name: Mackenzie Park MRN: [id number] Date of Birth: Apr 10, 1946  Clinical Social Work is seeking post-discharge placement for this patient at the [place] level of care (*CSW will initial, date and re-position this form in  chart as items are completed):  Yes   Patient/family provided with [place] Work Department's list of facilities offering this level of care within the geographic area requested by the patient (or if unable, by the patient's family).  Yes   Patient/family informed of their freedom to choose among providers that offer the needed level of care, that participate in Medicare, Medicaid or managed care program needed by the patient, have an available bed and are willing to accept the patient.  Yes   Patient/family informed of [facility]'s ownership interest in [facility] and [facility], as well as of the fact that they are under no obligation to receive care at these facilities.  PASRR submitted to EDS on       PASRR number received on       Existing PASRR number confirmed on 01/14/17     FL2 transmitted to all facilities in geographic area requested by pt/family on 01/14/17     FL2 transmitted to all facilities within larger geographic area on       Patient informed that his/her managed care company has contracts with or will negotiate with certain facilities, including the following:            Patient/family informed of bed offers received.  Patient chooses bed at       Physician recommends and patient chooses bed at      Patient to be transferred to   on  .  Patient to be transferred to facility by       Patient family notified on   of transfer.  Name of family member notified:        PHYSICIAN       Additional Comment:    _______________________________________________ Zettie Pho, LCSW 01/14/2017, 2:53 PM

## 2017-01-14 NOTE — Progress Notes (Signed)
Physical Therapy Evaluation Patient Details Name: Mackenzie Park MRN: [id number] DOB: 1946/09/02 Today's Date: 01/14/2017   History of Present Illness  71 yo female with onset of CAP and R lung partial collapse was admitted recently for bleeding.  PMHx: hyperglycemia, SOB, GIB, lung mass and liver lesions, CHF, HTN, DM, depression, LS neuritis  Clinical Impression  Pt is taking sidesteps to chair despite her leg pain and weakness, and is tolerating with proper leg positioning.  Her plan is to follow acutely to lessen length of SNF stay, focusing on standing control and gait safety, progressing as tolerated.    Follow Up Recommendations SNF    Equipment Recommendations  None recommended by PT    Recommendations for Other Services       Precautions / Restrictions Precautions Precautions: Fall Restrictions Weight Bearing Restrictions: No      Mobility  Bed Mobility Overal bed mobility: Needs Assistance Bed Mobility: Supine to Sit     Supine to sit: Mod assist        Transfers Overall transfer level: Needs assistance Equipment used: 1 person hand held assist Transfers: Sit to/from Stand;Stand Pivot Transfers Sit to Stand: Mod assist Stand pivot transfers: Mod assist       General transfer comment: Pt is moving to chair but can barely take a step  Ambulation/Gait             General Gait Details: unable  Stairs            Wheelchair Mobility    Modified Rankin (Stroke Patients Only)       Balance Overall balance assessment: Needs assistance;History of Falls Sitting-balance support: Feet supported Sitting balance-Leahy Scale: Good   Postural control: Posterior lean Standing balance support: Bilateral upper extremity supported Standing balance-Leahy Scale: Poor                               Pertinent Vitals/Pain Pain Assessment: Faces Faces Pain Scale: Hurts little more Pain Location: posterior lower legs Pain Descriptors /  Indicators: Aching;Sore Pain Intervention(s): Limited activity within patient's tolerance;Monitored during session;Premedicated before session;Repositioned    Home Living Family/patient expects to be discharged to:: Private residence Living Arrangements: Alone Available Help at Discharge: Family;Friend(s);Available 24 hours/day (but had gone home recently) Type of Home: House Home Access: Ramped entrance     Home Layout: One level Home Equipment: Grab bars - tub/shower;Walker - 2 wheels Additional Comments: Pt lives with family nearby    Prior Function Level of Independence: Independent with assistive device(s)         Comments: Indep with ADLs, household and community mobilization; 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  No home O2.     Hand Dominance   Dominant Hand: Right    Extremity/Trunk Assessment   Upper Extremity Assessment Upper Extremity Assessment: Overall WFL for tasks assessed    Lower Extremity Assessment Lower Extremity Assessment: Generalized weakness    Cervical / Trunk Assessment Cervical / Trunk Assessment: Normal  Communication   Communication: No difficulties  Cognition Arousal/Alertness: Awake/alert Behavior During Therapy: WFL for tasks assessed/performed Overall Cognitive Status: Within Functional Limits for tasks assessed                                        General Comments  Exercises     Assessment/Plan    PT Assessment Patient needs continued PT services  PT Problem List Decreased strength;Decreased activity tolerance;Decreased range of motion;Decreased balance;Decreased mobility;Decreased coordination;Decreased cognition;Decreased knowledge of use of DME;Decreased safety awareness;Cardiopulmonary status limiting activity;Decreased knowledge of precautions       PT Treatment Interventions DME instruction;Gait training;Functional mobility training;Therapeutic  activities;Therapeutic exercise;Balance training;Neuromuscular re-education;Patient/family education    PT Goals (Current goals can be found in the Care Plan section)  Acute Rehab PT Goals Patient Stated Goal: to feel better and get rid of leg pain PT Goal Formulation: With patient Time For Goal Achievement: 01/28/17 Potential to Achieve Goals: Good    Frequency Min 3X/week   Barriers to discharge Decreased caregiver support      Co-evaluation               AM-PAC PT "6 Clicks" Daily Activity  Outcome Measure Difficulty turning over in bed (including adjusting bedclothes, sheets and blankets)?: Total Difficulty moving from lying on back to sitting on the side of the bed? : Total Difficulty sitting down on and standing up from a chair with arms (e.g., wheelchair, bedside commode, etc,.)?: Total Help needed moving to and from a bed to chair (including a wheelchair)?: A Lot Help needed walking in hospital room?: A Lot Help needed climbing 3-5 steps with a railing? : Total 6 Click Score: 8    End of Session Equipment Utilized During Treatment: Gait belt;Oxygen Activity Tolerance: Patient limited by fatigue Patient left: in chair;with call bell/phone within reach;with chair alarm set Nurse Communication: Mobility status PT Visit Diagnosis: Unsteadiness on feet (R26.81);Muscle weakness (generalized) (M62.81);History of falling (Z91.81)    Time: 1610-9604 PT Time Calculation (min) (ACUTE ONLY): 31 min   Charges:   PT Evaluation $PT Eval Moderate Complexity: 1 Procedure PT Treatments $Therapeutic Activity: 8-22 mins   PT G Codes:        [place] Dial Feb 05, 2017, 12:41 PM   Mee Hives, PT MS Acute Rehab Dept. Number: [place] and [place]

## 2017-01-15 LAB — GLUCOSE, CAPILLARY
GLUCOSE-CAPILLARY: 249 mg/dL — AB (ref 65–99)
GLUCOSE-CAPILLARY: 253 mg/dL — AB (ref 65–99)
GLUCOSE-CAPILLARY: 236 mg/dL — AB (ref 65–99)

## 2017-01-15 MED ORDER — FUROSEMIDE 20 MG PO TABS
20.0000 mg | ORAL_TABLET | Freq: Every day | ORAL | Status: DC
  Administered 2017-01-16 – 2017-01-18 (×3): 20 mg via ORAL
  Filled 2017-01-15 (×3): qty 1

## 2017-01-15 MED ORDER — GLUCERNA SHAKE PO LIQD
237.0000 mL | Freq: Three times a day (TID) | ORAL | Status: DC
  Administered 2017-01-15 – 2017-01-17 (×6): 237 mL via ORAL

## 2017-01-15 MED ORDER — FUROSEMIDE 20 MG PO TABS: 20.0000 mg | ORAL_TABLET | Freq: Two times a day (BID) | ORAL | Status: DC

## 2017-01-15 NOTE — Progress Notes (Signed)
Initial Nutrition Assessment  DOCUMENTATION CODES:   Obesity unspecified  INTERVENTION:  1. Glucerna Shake po TID, each supplement provides 220 kcal and 10 grams of protein 2. Recommend calcium 1543m daily, B12 sublingual or injections due to hx of gastric bypass  NUTRITION DIAGNOSIS:   Inadequate oral intake related to poor appetite, other (see comment) (taste changes) as evidenced by per patient/family report.  GOAL:   Patient will meet greater than or equal to 90% of their needs  MONITOR:   PO intake, I & O's, Labs, Weight trends, Supplement acceptance  REASON FOR ASSESSMENT:   Malnutrition Screening Tool    ASSESSMENT:   JSamayra Hebel is a 71y.o. female with a known history of Metastatic cancer, CHF, depression, hypertension, hypercholesterolemia, diabetes- was admitted in hospital for bleeding issues and blood loss anemia 3 weeks ago was diagnosed to have metastatic cancer suspected lung cancer  Ms. Yokum did not eat much for lunch today but does want to consume glucerna. Continues to struggle with taste changes (metallic taste) and difficulty swallowing. Will downgrade diet to NDD3 as she states she tolerates pureed and soft foods best. At home she eats whatever her sister cooks for her "in order to keep her quiet." Denies any nausea/vomiting.   Patient reports poor appetite for a long time, weight seems to be relatively stable. States her weight has been fluctuating between 221-229# recently, thought it appears per chart she was 245# as of 4/06 - exhibiting a 22#/8.9% severe wt loss over 1 month (unsure of accuracy). She has not continued to follow up with her surgeons for gastric bypass surgery. Takes Ca-Vit D daily at home. Does not take B12 Unable to diagnose malnutrition at this time given only one criteria being met (<75% energy intake > 1 month)  Current weight loss is insignificant for acute malnutrition.  Nutrition-Focused physical exam completed. Findings  are no fat depletion, no muscle depletion, and mild edema.   Labs and medications reviewed: CBGs 253-249 KCL 271m, Lantus 20units, Novlog 5units WC, Colace  Diet Order:  Diet Carb Modified Fluid consistency: Thin; Room service appropriate? Yes  Skin:  Wound (see comment) (stg II to sacrum)  Last BM:  01/14/2017  Height:   Ht Readings from Last 1 Encounters:  01/13/17 5' 6"  (1.676 m)    Weight:   Wt Readings from Last 1 Encounters:  01/13/17 223 lb (101.2 kg)    Ideal Body Weight:  59.1 kg  BMI:  Body mass index is 35.99 kg/m.  Estimated Nutritional Needs:   Kcal:  2000-2400 calories (ABW x30-35 cal/kg)  Protein:  90-118 grams (ABW x1.3-1.7 g/kg)  Fluid:  >/= 2L  EDUCATION NEEDS:   No education needs identified at this time  WiSatira AnisWard, MS, RD LDN Inpatient Clinical Dietitian Pager [phone]

## 2017-01-15 NOTE — Progress Notes (Signed)
[place] at [place] NAME: Mackenzie Park    MR#:  [id number]  DATE OF BIRTH:  August 25, 1946  SUBJECTIVE:   Came in with overall decline at home with severe weakness and unable to care for herself and total care with family. Patient feels a whole lot better today.Marland Kitchen REVIEW OF SYSTEMS:   Review of Systems  Constitutional: Negative for chills, fever and weight loss.  HENT: Negative for ear discharge, ear pain and nosebleeds.   Eyes: Negative for blurred vision, pain and discharge.  Respiratory: Negative for sputum production, shortness of breath, wheezing and stridor.   Cardiovascular: Positive for orthopnea, leg swelling and PND. Negative for chest pain and palpitations.  Gastrointestinal: Negative for abdominal pain, diarrhea, nausea and vomiting.  Genitourinary: Negative for frequency and urgency.  Musculoskeletal: Negative for back pain and joint pain.  Neurological: Positive for weakness. Negative for sensory change, speech change and focal weakness.  Psychiatric/Behavioral: Negative for depression and hallucinations. The patient is not nervous/anxious.    Tolerating Diet: Yes Tolerating PT: Rehabilitation  DRUG ALLERGIES:   Allergies  Allergen Reactions  . Sulfa Antibiotics Anaphylaxis and Hives  . Other Nausea And Vomiting    Milk products    VITALS:  Blood pressure (!) 141/56, pulse 92, temperature 97.7 F (36.5 C), temperature source Oral, resp. rate 18, height '5\' 6"'$  (1.676 m), weight 101.2 kg (223 lb), SpO2 98 %.  PHYSICAL EXAMINATION:   Physical Exam  GENERAL:  71 y.o.-year-old patient lying in the bed with no acute distress. Obese EYES: Pupils equal, round, reactive to light and accommodation. No scleral icterus. Extraocular muscles intact.  HEENT: Head atraumatic, normocephalic. Oropharynx and nasopharynx clear.  NECK:  Supple, no jugular venous distention. No thyroid enlargement, no tenderness.  LUNGS: Normal  breath sounds bilaterally, no wheezing, rales, rhonchi. No use of accessory muscles of respiration.  CARDIOVASCULAR: S1, S2 normal. No murmurs, rubs, or gallops.  ABDOMEN: Soft, nontender, nondistended. Bowel sounds present. No organomegaly or mass.  EXTREMITIES: No cyanosis, clubbing o - chronic 2+ edema b/l.    NEUROLOGIC: Cranial nerves II through XII are intact. No focal Motor or sensory deficits b/l.   PSYCHIATRIC:  patient is alert and oriented x 3.  SKIN: No obvious rash, lesion, or ulcer.   LABORATORY PANEL:  CBC  Recent Labs Lab 01/14/17 0442  WBC 12.9*  HGB 9.2*  HCT 28.2*  PLT 189    Chemistries   Recent Labs Lab 01/14/17 0442  NA 136  K 4.0  CL 92*  CO2 32  GLUCOSE 319*  BUN 23*  CREATININE 0.84  CALCIUM 8.8*   Cardiac Enzymes No results for input(s): TROPONINI in the last 168 hours. RADIOLOGY:  Dg Chest 1 View  Result Date: 01/13/2017 CLINICAL DATA:  Increasing weakness.  Shortness of breath. EXAM: CHEST 1 VIEW COMPARISON:  Radiograph 12/20/2016, CT 12/15/2016 FINDINGS: Lower lung volumes from prior. Heart size and mediastinal contours are unchanged allowing for differences in technique. Known right hilar mass not well characterized radiographically. Associated postobstructive collapse in the right perihilar lung (upper lobe on CT). There is a new medial retrocardiac opacity at the left lung base. No pulmonary edema. Possible trace left pleural effusion. No acute osseous abnormalities. IMPRESSION: Retrocardiac left basilar opacity with small pleural effusion. This may be atelectasis or pneumonia. Partial right upper lobe collapse secondary to known right hilar mass/adenopathy, unchanged. Electronically Signed   By: Jeb Levering M.D.   On: 01/13/2017 18:45  ASSESSMENT AND PLAN:   Mackenzie Park  is a 71 y.o. female with a known history of Metastatic cancer, CHF, depression, hypertension, hypercholesterolemia, diabetes- was admitted in hospital for bleeding  issues and blood loss anemia 3 weeks ago was diagnosed to have metastatic cancer suspected lung cancer. But she continued to get significantly worse in her strength and she need complete care now by her sister and family.  * Pneumonia   We will give ceftriaxone and azithromycin and follow clinically.---Clinically does not have much signs of infection. We'll de-escalate antibiotics to zithromax -change to oral abxs in am Wbc improving No fever  * Bilateral pedal edema   Combination of immobility, metastatic cancer, CHF    received IV Lasix----appears euvolemic edema much better--- changed to oral Lasix  * Metastatic cancerSuspected lung cancer given lung mass  -Patient sees Dr. Yevette Edwards at the [facility] and according to his notes patient has decided not to pursue any aggressive workup including biopsy. -palliative care to see him Monday   * Anemia due to blood loss and cancer   Currently stable with oral iron therapy, continue monitoring. -Hemoglobin much improved with IV and oral iron -hgb is 9.5  * Generalized weakness and severe deconditioning. Patient during her admission in April 2018 was recommended to go to rehabilitation where she declined and went home with family. She is been overall very weak and now is agreeable for rehabilitation.  Physical therapy recommends rehabilitation Social worker for discharge planning   Case discussed with Care Management/Social Worker. Management plans discussed with the patient, family and they are in agreement.  CODE STATUS: DNR  DVT Prophylaxis: heparin  TOTAL TIME TAKING CARE OF THIS PATIENT: 30 minutes.  >50% time spent on counselling and coordination of care  POSSIBLE D/C IN 1-2 DAYS, DEPENDING ON CLINICAL CONDITION.  Note: This dictation was prepared with Dragon dictation along with smaller phrase technology. Any transcriptional errors that result from this process are unintentional.  Mackenzie Park M.D on 01/15/2017 at  11:08 AM  Between 7am to 6pm - Pager - [phone]  After 6pm go to [email or web address] - password [email or web address] [facility]  Office  [phone]  CC: Primary care physician; Roselee Nova, MD

## 2017-01-16 ENCOUNTER — Ambulatory Visit: Payer: Self-pay | Admitting: *Deleted

## 2017-01-16 LAB — GLUCOSE, CAPILLARY
GLUCOSE-CAPILLARY: 213 mg/dL — AB (ref 65–99)
GLUCOSE-CAPILLARY: 171 mg/dL — AB (ref 65–99)
GLUCOSE-CAPILLARY: 197 mg/dL — AB (ref 65–99)
GLUCOSE-CAPILLARY: 209 mg/dL — AB (ref 65–99)
Glucose-Capillary: 165 mg/dL — ABNORMAL HIGH (ref 65–99)

## 2017-01-16 MED ORDER — INSULIN GLARGINE 100 UNIT/ML ~~LOC~~ SOLN
25.0000 [IU] | Freq: Every day | SUBCUTANEOUS | Status: DC
  Administered 2017-01-16 – 2017-01-17 (×2): 25 [IU] via SUBCUTANEOUS
  Filled 2017-01-16 (×3): qty 0.25

## 2017-01-16 MED ORDER — INSULIN GLARGINE 100 UNIT/ML ~~LOC~~ SOLN: 25.0000 [IU] | Freq: Every day | SUBCUTANEOUS | 11 refills | Status: AC

## 2017-01-16 MED ORDER — AZITHROMYCIN 250 MG PO TABS: 250.0000 mg | ORAL_TABLET | Freq: Every day | ORAL | 0 refills | Status: AC

## 2017-01-16 NOTE — Progress Notes (Signed)
Inpatient Diabetes Program Recommendations  AACE/ADA: New Consensus Statement on Inpatient Glycemic Control (2015)  Target Ranges:  Prepandial:   less than 140 mg/dL      Peak postprandial:   less than 180 mg/dL (1-2 hours)      Critically ill patients:  140 - 180 mg/dL   Results for ROSHONDA, SPERL (MRN [id number]) as of 01/16/2017 09:40  Ref. Range 01/15/2017 07:44 01/15/2017 11:42 01/15/2017 16:56 01/16/2017 07:32  Glucose-Capillary Latest Ref Range: 65 - 99 mg/dL 249 (H) 253 (H) 236 (H) 171 (H)   Review of Glycemic Control  Diabetes history: DM2 Outpatient Diabetes medications: Lantus 20 units QHS, Januvia 100 mg daily Current orders for Inpatient glycemic control: Lantus 20 units QHS, Novolog 0-9 units TID with meals, Tradjenta 5 mg daily  Inpatient Diabetes Program Recommendations:  Insulin - Basal: Please consider increasing Lantus to 22 units QHS. Correction (SSI): Please consider increasing Novolog correction to Moderate scale (0-15 units) and adding Novolog 0-5 units QHS for bedtime correction. If patient continues to have poor PO intake, may want to change frequency of CBGs and Novolog to Q4H.  Thanks, Barnie Alderman, RN, MSN, CDE Diabetes Coordinator Inpatient Diabetes Program [phone] (Team Pager from 8am to 5pm)

## 2017-01-16 NOTE — Care Management Important Message (Signed)
Important Message  Patient Details  Name: Mackenzie Park MRN: [id number] Date of Birth: December 19, 1945   Medicare Important Message Given:  Yes    Shelbie Ammons, RN 01/16/2017, 10:28 AM

## 2017-01-16 NOTE — Clinical Social Work Note (Signed)
CSW met with pt present bed offers. Pt chose [facility]. CSW updated facility and [facility] has been initiated. MD is aware. CSW will continue to follow.   Darden Dates, MSW, LCSW Clinical Social Worker  [phone]

## 2017-01-16 NOTE — Discharge Summary (Deleted)
[place] at [place] NAME: Mackenzie Park    MR#:  [id number]  DATE OF BIRTH:  10-27-1945  DATE OF ADMISSION:  01/13/2017 ADMITTING PHYSICIAN: Vaughan Basta, MD  DATE OF DISCHARGE: 01/16/2017  PRIMARY CARE PHYSICIAN: Roselee Nova, MD    ADMISSION DIAGNOSIS:  Weakness [R53.1] Community acquired pneumonia of left lower lobe of lung ([place]) [J18.1] Acute on chronic congestive heart failure, unspecified heart failure type ([place]) [I50.9]  DISCHARGE DIAGNOSIS:  Generalized weakness Chronic anemia Acute on chronic CHF diastolic  SECONDARY DIAGNOSIS:   Past Medical History:  Diagnosis Date  . Allergic rhinitis   . Cancer ([place])    liver  . CHF (congestive heart failure) ([place])   . Depressive disorder   . Diabetes mellitus ([place])   . Hypercholesteremia   . Hypertension   . Lumbosacral neuritis   . Osteoarthritis   . Tumor liver     HOSPITAL COURSE:   Mackenzie Park a 71 y.o.femalewith a known history of Metastatic cancer, CHF, depression, hypertension, hypercholesterolemia, diabetes- was admitted in hospital for bleeding issues and blood loss anemia 3 weeks ago was diagnosed to have metastatic cancer suspected lung cancer. But she continued to get significantly worse in her strength and she need complete care now by her sister and family.  * Pneumonia -received ceftriaxone and azithromycin and follow clinically.---Clinically does not have much signs of infection. We'll de-escalate antibiotics to zithromax Wbc improving No fever  * Bilateral pedal edema With history of diastolic congestive heart failure acute on chronic -Combination of immobility, metastatic cancer, CHF - received IV Lasix----appears euvolemic edema much better--- changed to oral Lasix -Patient requires 2 L of nasal cannula oxygen to keep sats greater than 92%  * Metastatic cancerSuspected lung cancer given lung mass  -Patient sees Dr.  Yevette Edwards at the [facility] and according to his notes patient has decided not to pursue any aggressive workup including biopsy.  * Anemia due to blood loss and cancer Currently stable with oral iron therapy, continue monitoring. -Hemoglobin much improved with IV and oral iron -hgb is 9.5  * Generalized weakness and severe deconditioning. Patient during her admission in April 2018 was recommended to go to rehabilitation where she declined and went home with family. She is been overall very weak and now is agreeable for rehabilitation.  Physical therapy recommends rehabilitation Social worker for discharge planning   Pt is at baseline. D/c to rehab today CONSULTS OBTAINED:    DRUG ALLERGIES:   Allergies  Allergen Reactions  . Sulfa Antibiotics Anaphylaxis and Hives  . Other Nausea And Vomiting    Milk products    DISCHARGE MEDICATIONS:   Current Discharge Medication List    START taking these medications   Details  azithromycin (ZITHROMAX) 250 MG tablet Take 1 tablet (250 mg total) by mouth daily. Qty: 3 tablet, Refills: 0      CONTINUE these medications which have CHANGED   Details  insulin glargine (LANTUS) 100 UNIT/ML injection Inject 0.25 mLs (25 Units total) into the skin at bedtime. Increase to 25 units after 3 days if sugars not under 200 Qty: 10 mL, Refills: 11      CONTINUE these medications which have NOT CHANGED   Details  ACCU-CHEK AVIVA PLUS test strip     ACCU-CHEK SOFTCLIX LANCETS lancets     Blood Glucose Monitoring Suppl (ACCU-CHEK AVIVA PLUS) w/Device KIT     feeding supplement, GLUCERNA SHAKE, (GLUCERNA SHAKE) LIQD Take 237 mLs  by mouth 2 (two) times daily between meals. Qty: 30 Can, Refills: 0    fluticasone (FLONASE) 50 MCG/ACT nasal spray Place 1-2 sprays into both nostrils daily as needed for allergies.     furosemide (LASIX) 20 MG tablet Take 1 tablet (20 mg total) by mouth daily. Qty: 30 tablet, Refills: 0    gabapentin  (NEURONTIN) 300 MG capsule Take 1 capsule (300 mg total) by mouth at bedtime. Qty: 90 capsule, Refills: 0   Associated Diagnoses: Uncontrolled type 2 diabetes mellitus with peripheral neuropathy (HCC)    iron polysaccharides (NIFEREX) 150 MG capsule Take 1 capsule (150 mg total) by mouth daily. Qty: 30 capsule, Refills: 0    meloxicam (MOBIC) 15 MG tablet Take 15 mg by mouth daily.     metoprolol tartrate (LOPRESSOR) 25 MG tablet Take 0.5 tablets (12.5 mg total) by mouth 2 (two) times daily. Qty: 60 tablet, Refills: 0    Multiple Vitamin (MULTIVITAMIN WITH MINERALS) TABS tablet Take 1 tablet by mouth daily. Qty: 30 tablet, Refills: 0    pantoprazole (PROTONIX) 40 MG tablet Take 1 tablet (40 mg total) by mouth 2 (two) times daily before a meal. Qty: 60 tablet, Refills: 1    potassium chloride SA (K-DUR,KLOR-CON) 20 MEQ tablet Take 1 tablet (20 mEq total) by mouth 2 (two) times daily. Qty: 30 tablet, Refills: 0    sitaGLIPtin (JANUVIA) 100 MG tablet Take 1 tablet (100 mg total) by mouth daily. Qty: 270 tablet, Refills: 0   Associated Diagnoses: Uncontrolled type 2 diabetes mellitus with peripheral neuropathy (HCC)    sucralfate (CARAFATE) 1 GM/10ML suspension Take 10 mLs (1 g total) by mouth 4 (four) times daily -  with meals and at bedtime. Qty: 420 mL, Refills: 0        If you experience worsening of your admission symptoms, develop shortness of breath, life threatening emergency, suicidal or homicidal thoughts you must seek medical attention immediately by calling 911 or calling your MD immediately  if symptoms less severe.  You Must read complete instructions/literature along with all the possible adverse reactions/side effects for all the Medicines you take and that have been prescribed to you. Take any new Medicines after you have completely understood and accept all the possible adverse reactions/side effects.   Please note  You were cared for by a hospitalist during your  hospital stay. If you have any questions about your discharge medications or the care you received while you were in the hospital after you are discharged, you can call the unit and asked to speak with the hospitalist on call if the hospitalist that took care of you is not available. Once you are discharged, your primary care physician will handle any further medical issues. Please note that NO REFILLS for any discharge medications will be authorized once you are discharged, as it is imperative that you return to your primary care physician (or establish a relationship with a primary care physician if you do not have one) for your aftercare needs so that they can reassess your need for medications and monitor your lab values. Today   SUBJECTIVE  No new complaints   VITAL SIGNS:  Blood pressure (!) 124/54, pulse 88, temperature 98 F (36.7 C), temperature source Oral, resp. rate 18, height 5' 6"  (1.676 m), weight 101.2 kg (223 lb), SpO2 93 %.  I/O:   Intake/Output Summary (Last 24 hours) at 01/16/17 1108 Last data filed at 01/16/17 0800  Gross per 24 hour  Intake  240 ml  Output                0 ml  Net              240 ml    PHYSICAL EXAMINATION:  GENERAL:  71 y.o.-year-old patient lying in the bed with no acute distress.  EYES: Pupils equal, round, reactive to light and accommodation. No scleral icterus. Extraocular muscles intact.  HEENT: Head atraumatic, normocephalic. Oropharynx and nasopharynx clear.  NECK:  Supple, no jugular venous distention. No thyroid enlargement, no tenderness.  LUNGS: Normal breath sounds bilaterally, no wheezing, rales,rhonchi or crepitation. No use of accessory muscles of respiration.  CARDIOVASCULAR: S1, S2 normal. No murmurs, rubs, or gallops.  ABDOMEN: Soft, non-tender, non-distended. Bowel sounds present. No organomegaly or mass.  EXTREMITIES: +pedal edema, cyanosis, or clubbing.  NEUROLOGIC: Cranial nerves II through XII are intact. Muscle  strength 5/5 in all extremities. Sensation intact. Gait not checked.  PSYCHIATRIC: The patient is alert and oriented x 3.  SKIN: No obvious rash, lesion, or ulcer.   DATA REVIEW:   CBC   Recent Labs Lab 01/14/17 0442  WBC 12.9*  HGB 9.2*  HCT 28.2*  PLT 189    Chemistries   Recent Labs Lab 01/14/17 0442  NA 136  K 4.0  CL 92*  CO2 32  GLUCOSE 319*  BUN 23*  CREATININE 0.84  CALCIUM 8.8*    Microbiology Results   Recent Results (from the past 240 hour(s))  Blood culture (routine x 2)     Status: None (Preliminary result)   Collection Time: 01/13/17  7:45 PM  Result Value Ref Range Status   Specimen Description BLOOD LEFT ANTECUBITAL  Final   Special Requests   Final    BOTTLES DRAWN AEROBIC AND ANAEROBIC Blood Culture adequate volume   Culture NO GROWTH 3 DAYS  Final   Report Status PENDING  Incomplete  Blood culture (routine x 2)     Status: None (Preliminary result)   Collection Time: 01/13/17  7:52 PM  Result Value Ref Range Status   Specimen Description BLOOD RIGHT ARM  Final   Special Requests   Final    BOTTLES DRAWN AEROBIC AND ANAEROBIC Blood Culture results may not be optimal due to an excessive volume of blood received in culture bottles   Culture NO GROWTH 3 DAYS  Final   Report Status PENDING  Incomplete    RADIOLOGY:  No results found.   Management plans discussed with the patient, family and they are in agreement.  CODE STATUS:     Code Status Orders        Start     Ordered   01/13/17 2253  Do not attempt resuscitation (DNR)  Continuous    Question Answer Comment  In the event of cardiac or respiratory ARREST Do not call a "code blue"   In the event of cardiac or respiratory ARREST Do not perform Intubation, CPR, defibrillation or ACLS   In the event of cardiac or respiratory ARREST Use medication by any route, position, wound care, and other measures to relive pain and suffering. May use oxygen, suction and manual treatment of  airway obstruction as needed for comfort.      01/13/17 2253    Code Status History    Date Active Date Inactive Code Status Order ID Comments User Context   12/20/2016  9:29 PM 12/23/2016  5:13 PM DNR [id number]  Loletha Grayer, MD ED   12/12/2016  3:57  AM 12/19/2016  8:04 PM Full Code [id number]  Ivor Costa, MD ED   05/11/2015  2:13 PM 05/12/2015  4:10 PM Full Code [id number]  Bettey Costa, MD Inpatient    Advance Directive Documentation     Most Recent Value  Type of Advance Directive  Healthcare Power of Attorney  Pre-existing out of facility DNR order (yellow form or pink MOST form)  -  "MOST" Form in Place?  -      TOTAL TIME TAKING CARE OF THIS PATIENT: *40* minutes.    Khing Belcher M.D on 01/16/2017 at 11:08 AM  Between 7am to 6pm - Pager - [phone] After 6pm go to [email or web address] - password [email or web address] [facility]  Office  [phone]  CC: Primary care physician; Roselee Nova, MD

## 2017-01-16 NOTE — Discharge Instructions (Signed)
Palliative care to follow at Rehab/SNF

## 2017-01-17 LAB — GLUCOSE, CAPILLARY
GLUCOSE-CAPILLARY: 183 mg/dL — AB (ref 65–99)
Glucose-Capillary: 170 mg/dL — ABNORMAL HIGH (ref 65–99)
Glucose-Capillary: 216 mg/dL — ABNORMAL HIGH (ref 65–99)
Glucose-Capillary: 162 mg/dL — ABNORMAL HIGH (ref 65–99)

## 2017-01-17 LAB — CREATININE, SERUM
Creatinine, Ser: 0.72 mg/dL (ref 0.44–1.00)
GFR calc non Af Amer: >60 mL/min (ref >60)

## 2017-01-17 LAB — CBC
HEMATOCRIT: 28.3 % — AB (ref 35.0–47.0)
Hemoglobin: 9 g/dL — ABNORMAL LOW (ref 12.0–16.0)
MCH: 29.8 pg (ref 26.0–34.0)
MCHC: 32 g/dL (ref 32.0–36.0)
MCV: 93.1 fL (ref 80.0–100.0)
Platelets: 195 10*3/uL (ref 150–440)
RBC: 3.04 MIL/uL — ABNORMAL LOW (ref 3.80–5.20)
RDW: 22.5 % — ABNORMAL HIGH (ref 11.5–14.5)
WBC: 13.2 10*3/uL — ABNORMAL HIGH (ref 3.6–11.0)

## 2017-01-17 NOTE — Discharge Summary (Signed)
[facility] NAME: Brande Uncapher    MR#:  [id number]  DATE OF BIRTH:  [birth year]  DATE OF ADMISSION:  01/13/2017 ADMITTING PHYSICIAN: Vaughan Basta, MD  DATE OF DISCHARGE: 01/17/2017  PRIMARY CARE PHYSICIAN: Roselee Nova, MD    ADMISSION DIAGNOSIS:  Weakness [R53.1] Community acquired pneumonia of left lower lobe of lung ([place]) [J18.1] Acute on chronic congestive heart failure, unspecified heart failure type ([place]) [I50.9]  DISCHARGE DIAGNOSIS:  Generalized weakness Chronic anemia-iron deficiency Acute on chronic CHF diastolic-improving  SECONDARY DIAGNOSIS:   Past Medical History:  Diagnosis Date  . Allergic rhinitis   . Cancer ([place])    liver  . CHF (congestive heart failure) ([place])   . Depressive disorder   . Diabetes mellitus ([place])   . Hypercholesteremia   . Hypertension   . Lumbosacral neuritis   . Osteoarthritis   . Tumor liver     HOSPITAL COURSE:   [name] a 71 y.o.femalewith a known history of Metastatic cancer, CHF, depression, hypertension, hypercholesterolemia, diabetes- was admitted in hospital for bleeding issues and blood loss anemia 3 weeks ago was diagnosed to have metastatic cancer suspected lung cancer. But she continued to get significantly worse in her strength and she need complete care now by her sister and family.  * Pneumonia -received ceftriaxone and azithromycin and follow clinically.---Clinically does not have much signs of infection. We'll de-escalate antibiotics to zithromax Wbc improving No fever  * Bilateral pedal edema With history of diastolic congestive heart failure acute on chronic -Combination of immobility, metastatic cancer, CHF - received IV Lasix----appears euvolemic edema much better--- changed to oral Lasix -Patient requires 2 L of nasal cannula oxygen to keep sats greater than 92%  * Metastatic cancerSuspected lung cancer given lung mass   -Patient sees Dr. Yevette Edwards at the [facility] and according to his notes patient has decided not to pursue any aggressive workup including biopsy.  * Anemia due to blood loss and cancer Currently stable with oral iron therapy, continue monitoring. -Hemoglobin much improved with IV and oral iron -hgb is 9.5  * Generalized weakness and severe deconditioning. Patient during her admission in April 2018 was recommended to go to rehabilitation where she declined and went home with family. She is been overall very weak and now is agreeable for rehabilitation.  Physical therapy recommends rehabilitation Social worker for discharge planning   Pt is at baseline. D/c to rehab today CONSULTS OBTAINED:    DRUG ALLERGIES:   Allergies  Allergen Reactions  . Sulfa Antibiotics Anaphylaxis and Hives  . Other Nausea And Vomiting    Milk products    DISCHARGE MEDICATIONS:   Current Discharge Medication List    START taking these medications   Details  azithromycin (ZITHROMAX) 250 MG tablet Take 1 tablet (250 mg total) by mouth daily. Qty: 3 tablet, Refills: 0      CONTINUE these medications which have CHANGED   Details  insulin glargine (LANTUS) 100 UNIT/ML injection Inject 0.25 mLs (25 Units total) into the skin at bedtime. Increase to 25 units after 3 days if sugars not under 200 Qty: 10 mL, Refills: 11      CONTINUE these medications which have NOT CHANGED   Details  ACCU-CHEK AVIVA PLUS test strip     ACCU-CHEK SOFTCLIX LANCETS lancets     Blood Glucose Monitoring Suppl (ACCU-CHEK AVIVA PLUS) w/Device KIT     feeding supplement, GLUCERNA SHAKE, (GLUCERNA SHAKE) LIQD Take 237  mLs by mouth 2 (two) times daily between meals. Qty: 30 Can, Refills: 0    fluticasone (FLONASE) 50 MCG/ACT nasal spray Place 1-2 sprays into both nostrils daily as needed for allergies.     furosemide (LASIX) 20 MG tablet Take 1 tablet (20 mg total) by mouth daily. Qty: 30 tablet, Refills: 0     gabapentin (NEURONTIN) 300 MG capsule Take 1 capsule (300 mg total) by mouth at bedtime. Qty: 90 capsule, Refills: 0   Associated Diagnoses: Uncontrolled type 2 diabetes mellitus with peripheral neuropathy (HCC)    iron polysaccharides (NIFEREX) 150 MG capsule Take 1 capsule (150 mg total) by mouth daily. Qty: 30 capsule, Refills: 0    meloxicam (MOBIC) 15 MG tablet Take 15 mg by mouth daily.     metoprolol tartrate (LOPRESSOR) 25 MG tablet Take 0.5 tablets (12.5 mg total) by mouth 2 (two) times daily. Qty: 60 tablet, Refills: 0    Multiple Vitamin (MULTIVITAMIN WITH MINERALS) TABS tablet Take 1 tablet by mouth daily. Qty: 30 tablet, Refills: 0    pantoprazole (PROTONIX) 40 MG tablet Take 1 tablet (40 mg total) by mouth 2 (two) times daily before a meal. Qty: 60 tablet, Refills: 1    potassium chloride SA (K-DUR,KLOR-CON) 20 MEQ tablet Take 1 tablet (20 mEq total) by mouth 2 (two) times daily. Qty: 30 tablet, Refills: 0    sitaGLIPtin (JANUVIA) 100 MG tablet Take 1 tablet (100 mg total) by mouth daily. Qty: 270 tablet, Refills: 0   Associated Diagnoses: Uncontrolled type 2 diabetes mellitus with peripheral neuropathy (HCC)    sucralfate (CARAFATE) 1 GM/10ML suspension Take 10 mLs (1 g total) by mouth 4 (four) times daily -  with meals and at bedtime. Qty: 420 mL, Refills: 0        If you experience worsening of your admission symptoms, develop shortness of breath, life threatening emergency, suicidal or homicidal thoughts you must seek medical attention immediately by calling 911 or calling your MD immediately  if symptoms less severe.  You Must read complete instructions/literature along with all the possible adverse reactions/side effects for all the Medicines you take and that have been prescribed to you. Take any new Medicines after you have completely understood and accept all the possible adverse reactions/side effects.   Please note  You were cared for by a  hospitalist during your hospital stay. If you have any questions about your discharge medications or the care you received while you were in the hospital after you are discharged, you can call the unit and asked to speak with the hospitalist on call if the hospitalist that took care of you is not available. Once you are discharged, your primary care physician will handle any further medical issues. Please note that NO REFILLS for any discharge medications will be authorized once you are discharged, as it is imperative that you return to your primary care physician (or establish a relationship with a primary care physician if you do not have one) for your aftercare needs so that they can reassess your need for medications and monitor your lab values. Today   SUBJECTIVE  No new complaints   VITAL SIGNS:  Blood pressure (!) 117/57, pulse 87, temperature 98.2 F (36.8 C), resp. rate 20, height _0  (1.676 m), weight 101.2 kg (223 lb), SpO2 94 %.  I/O:    Intake/Output Summary (Last 24 hours) at 01/17/17 0749 Last data filed at 01/16/17 1800  Gross per 24 hour  Intake  120 ml  Output                0 ml  Net              120 ml    PHYSICAL EXAMINATION:  GENERAL:  71 y.o.-year-old patient lying in the bed with no acute distress.  EYES: Pupils equal, round, reactive to light and accommodation. No scleral icterus. Extraocular muscles intact.  HEENT: Head atraumatic, normocephalic. Oropharynx and nasopharynx clear.  NECK:  Supple, no jugular venous distention. No thyroid enlargement, no tenderness.  LUNGS: Normal breath sounds bilaterally, no wheezing, rales,rhonchi or crepitation. No use of accessory muscles of respiration.  CARDIOVASCULAR: S1, S2 normal. No murmurs, rubs, or gallops.  ABDOMEN: Soft, non-tender, non-distended. Bowel sounds present. No organomegaly or mass.  EXTREMITIES: +pedal edema, cyanosis, or clubbing.  NEUROLOGIC: Cranial nerves II through XII are intact.  Muscle strength 5/5 in all extremities. Sensation intact. Gait not checked.  PSYCHIATRIC: The patient is alert and oriented x 3.  SKIN: No obvious rash, lesion, or ulcer.   DATA REVIEW:   CBC   Recent Labs Lab 01/17/17 0713  WBC 13.2*  HGB 9.0*  HCT 28.3*  PLT 195    Chemistries   Recent Labs Lab 01/14/17 0442 01/17/17 0713  NA 136  --   K 4.0  --   CL 92*  --   CO2 32  --   GLUCOSE 319*  --   BUN 23*  --   CREATININE 0.84 0.72  CALCIUM 8.8*  --     Microbiology Results   Recent Results (from the past 240 hour(s))  Blood culture (routine x 2)     Status: None (Preliminary result)   Collection Time: 01/13/17  7:45 PM  Result Value Ref Range Status   Specimen Description BLOOD LEFT ANTECUBITAL  Final   Special Requests   Final    BOTTLES DRAWN AEROBIC AND ANAEROBIC Blood Culture adequate volume   Culture NO GROWTH 4 DAYS  Final   Report Status PENDING  Incomplete  Blood culture (routine x 2)     Status: None (Preliminary result)   Collection Time: 01/13/17  7:52 PM  Result Value Ref Range Status   Specimen Description BLOOD RIGHT ARM  Final   Special Requests   Final    BOTTLES DRAWN AEROBIC AND ANAEROBIC Blood Culture results may not be optimal due to an excessive volume of blood received in culture bottles   Culture NO GROWTH 4 DAYS  Final   Report Status PENDING  Incomplete    RADIOLOGY:  No results found.   Management plans discussed with the patient, family and they are in agreement.  CODE STATUS:     Code Status Orders        Start     Ordered   01/13/17 2253  Do not attempt resuscitation (DNR)  Continuous    Question Answer Comment  In the event of cardiac or respiratory ARREST Do not call a "code blue"   In the event of cardiac or respiratory ARREST Do not perform Intubation, CPR, defibrillation or ACLS   In the event of cardiac or respiratory ARREST Use medication by any route, position, wound care, and other measures to relive pain and  suffering. May use oxygen, suction and manual treatment of airway obstruction as needed for comfort.      01/13/17 2253    Code Status History    Date Active Date Inactive Code Status Order ID Comments  User Context   12/20/2016  9:29 PM 12/23/2016  5:13 PM DNR [id number]  Loletha Grayer, MD ED   12/12/2016  3:57 AM 12/19/2016  8:04 PM Full Code [id number]  Ivor Costa, MD ED   05/11/2015  2:13 PM 05/12/2015  4:10 PM Full Code [id number]  Bettey Costa, MD Inpatient    Advance Directive Documentation     Most Recent Value  Type of Advance Directive  Healthcare Power of Attorney  Pre-existing out of facility DNR order (yellow form or pink MOST form)  -  "MOST" Form in Place?  -      TOTAL TIME TAKING CARE OF THIS PATIENT: *40* minutes.    Norbert Malkin M.D on 01/17/2017 at 7:49 AM  Between 7am to 6pm - Pager - [phone] After 6pm go to [email or web address] - password [email or web address] [facility]  Office  [phone]  CC: Primary care physician; Roselee Nova, MD

## 2017-01-17 NOTE — Progress Notes (Signed)
Physical Therapy Treatment Patient Details Name: Mackenzie Park MRN: [id number] DOB: 10/11/1945 Today's Date: 01/17/2017    History of Present Illness 71 yo female with onset of CAP and R lung partial collapse was admitted recently for bleeding.  PMHx: hyperglycemia, SOB, GIB, lung mass and liver lesions, CHF, HTN, DM, depression, LS neuritis    PT Comments    Participated in exercises as described below.  Pt was able to get to EOB with mod a x 1.  Sitting balance with min guard.  Stood x 3 with mod a x 2.  On first attempt posture remained stooped with limited standing time.  On second, she was able to stand fully and march in place.  On third attempt Park quality improved and she was able to sidestep 2' along edge of bed.  Pt remains globally weak.  Mobility is limited by weakness and fear of falling.  Confidence improved with standing trials and encouragement.  She is motivated to increase functional independence. SNF remains appropriate for rehab at this time.   Follow Up Recommendations  SNF     Equipment Recommendations       Recommendations for Other Services       Precautions / Restrictions Precautions Precautions: Fall Restrictions Weight Bearing Restrictions: No    Mobility  Bed Mobility Overal bed mobility: Needs Assistance Bed Mobility: Supine to Sit;Sit to Supine     Supine to sit: Mod assist Sit to supine: Mod assist;+2 for physical assistance      Transfers Park transfer level: Needs assistance Equipment used: Rolling walker (2 wheeled) Transfers: Sit to/from Stand Sit to Stand: Mod assist;+2 physical assistance            Ambulation/Gait                 Stairs            Wheelchair Mobility    Modified Rankin (Stroke Patients Only)       Balance Park balance assessment: Needs assistance;History of Falls Sitting-balance support: Feet supported Sitting balance-Leahy Scale: Good     Standing balance support:  Bilateral upper extremity supported Standing balance-Leahy Scale: Poor                              Cognition Arousal/Alertness: Awake/alert Behavior During Therapy: WFL for tasks assessed/performed Park Cognitive Status: Within Functional Limits for tasks assessed                                        Exercises Other Exercises Other Exercises: supine AAROM x 10 for ankle pumps, heel slides, slr and ab/add  Other Exercises: standing - marches and slr x 10, attempted heel raises but unable     General Comments        Pertinent Vitals/Pain Pain Assessment: 0-10 Pain Score: 2  Pain Location: posterior lower legs Pain Descriptors / Indicators: Aching;Sore Pain Intervention(s): Limited activity within patient's tolerance    Home Living                      Prior Function            PT Goals (current goals can now be found in the care plan section) Progress towards PT goals: Progressing toward goals    Frequency    Min 3X/week  PT Plan      Co-evaluation              AM-PAC PT "6 Clicks" Daily Activity  Outcome Measure  Difficulty turning over in bed (including adjusting bedclothes, sheets and blankets)?: Total Difficulty moving from lying on back to sitting on the side of the bed? : Total Difficulty sitting down on and standing up from a chair with arms (e.g., wheelchair, bedside commode, etc,.)?: Total Help needed moving to and from a bed to chair (including a wheelchair)?: A Lot Help needed walking in hospital room?: Total Help needed climbing 3-5 steps with a railing? : Total 6 Click Score: 7    End of Session Equipment Utilized During Treatment: Gait belt;Oxygen Activity Tolerance: Patient limited by fatigue;Patient tolerated treatment well Patient left: in bed;with bed alarm set;with call bell/phone within reach         Time: 1112-1130 PT Time Calculation (min) (ACUTE ONLY): 18 min  Charges:   $Therapeutic Exercise: 8-22 mins                    G Codes:       Chesley Noon, PTA 01/17/17, 12:16 PM

## 2017-01-17 NOTE — Plan of Care (Signed)
Problem: Skin Integrity: Goal: Risk for impaired skin integrity will decrease Outcome: Not Progressing Pt  With stage II sacral pressure ulcer. Foam dressing in place.

## 2017-01-17 NOTE — Progress Notes (Signed)
[facility] NAME: Mackenzie Park    MR#:  [id number]  DATE OF BIRTH:  July 22, 1946  SUBJECTIVE:  Doing well. No complaints  REVIEW OF SYSTEMS:   Review of Systems  Constitutional: Negative for chills, fever and weight loss.  HENT: Negative for ear discharge, ear pain and nosebleeds.   Eyes: Negative for blurred vision, pain and discharge.  Respiratory: Negative for sputum production, shortness of breath, wheezing and stridor.   Cardiovascular: Negative for chest pain, palpitations, orthopnea and PND.  Gastrointestinal: Negative for abdominal pain, diarrhea, nausea and vomiting.  Genitourinary: Negative for frequency and urgency.  Musculoskeletal: Negative for back pain and joint pain.  Neurological: Negative for sensory change, speech change, focal weakness and weakness.  Psychiatric/Behavioral: Negative for depression and hallucinations. The patient is not nervous/anxious.    Tolerating Diet:yes Tolerating PT: SNF  DRUG ALLERGIES:   Allergies  Allergen Reactions  . Sulfa Antibiotics Anaphylaxis and Hives  . Other Nausea And Vomiting    Milk products    VITALS:  Blood pressure (!) 117/57, pulse 87, temperature 98.2 F (36.8 C), resp. rate 20, height '5\' 6"'$  (1.676 m), weight 101.2 kg (223 lb), SpO2 94 %.  PHYSICAL EXAMINATION:   Physical Exam  GENERAL:  71 y.o.-year-old patient lying in the bed with no acute distress. obese EYES: Pupils equal, round, reactive to light and accommodation. No scleral icterus. Extraocular muscles intact.  HEENT: Head atraumatic, normocephalic. Oropharynx and nasopharynx clear.  NECK:  Supple, no jugular venous distention. No thyroid enlargement, no tenderness.  LUNGS: Normal breath sounds bilaterally, no wheezing, rales, rhonchi. No use of accessory muscles of respiration.  CARDIOVASCULAR: S1, S2 normal. No murmurs, rubs, or gallops.  ABDOMEN: Soft, nontender, nondistended. Bowel sounds  present. No organomegaly or mass.  EXTREMITIES: No cyanosis, clubbing or edema b/l.    NEUROLOGIC: Cranial nerves II through XII are intact. No focal Motor or sensory deficits b/l.   PSYCHIATRIC:  patient is alert and oriented x 3.  SKIN: No obvious rash, lesion, or ulcer.   LABORATORY PANEL:  CBC  Recent Labs Lab 01/17/17 0713  WBC 13.2*  HGB 9.0*  HCT 28.3*  PLT 195    Chemistries   Recent Labs Lab 01/14/17 0442 01/17/17 0713  NA 136  --   K 4.0  --   CL 92*  --   CO2 32  --   GLUCOSE 319*  --   BUN 23*  --   CREATININE 0.84 0.72  CALCIUM 8.8*  --    Cardiac Enzymes No results for input(s): TROPONINI in the last 168 hours. RADIOLOGY:  No results found. ASSESSMENT AND PLAN:  [name] a 71 y.o.femalewith a known history of Metastatic cancer, CHF, depression, hypertension, hypercholesterolemia, diabetes- was admitted in hospital for bleeding issues and blood loss anemia 3 weeks ago was diagnosed to have metastatic cancer suspected lung cancer. But she continued to get significantly worse in her strength and she need complete care now by her sister and family.  * Pneumonia -received ceftriaxone and azithromycin and follow clinically.---Clinically does not have much signs of infection. We'll de-escalate antibiotics to zithromax Wbc improving No fever  * Bilateral pedal edema With history of diastolic congestive heart failure acute on chronic -Combination of immobility, metastatic cancer, CHF -received IV Lasix----appears euvolemic edema much better---changed to oral Lasix -Patient requires 2 L of nasal cannula oxygen to keep sats greater than 92%  * Metastatic cancerSuspected lung cancer given lung  mass  -Patient sees Dr. Yevette Edwards at the [facility] and according to his notes patient has decided not to pursue any aggressive workup including biopsy.  * Anemia due to blood loss and cancer Currently stable with oral iron therapy, continue  monitoring. -Hemoglobin much improved with IV and oral iron -hgb is 9.5  * Generalized weakness and severe deconditioning. Patient during her admission in April 2018 was recommended to go to rehabilitation where she declined and went home with family. She is been overall very weak and now is agreeable for rehabilitation.  Physical therapy recommends rehabilitation Social worker for discharge planning  Case discussed with Care Management/Social Worker. Management plans discussed with the patient, family and they are in agreement.  CODE STATUS: DNR    TOTAL TIME TAKING CARE OF THIS PATIENT: *25* minutes.  >50% time spent on counselling and coordination of care  POSSIBLE D/C IN *1-2* DAYS, DEPENDING ON CLINICAL CONDITION.  Note: This dictation was prepared with Dragon dictation along with smaller phrase technology. Any transcriptional errors that result from this process are unintentional.  Marshell Rieger M.D on 01/17/2017 at 7:50 AM  Between 7am to 6pm - Pager - [phone]  After 6pm go to [email or web address] - password [email or web address] [facility]  Office  [phone]  CC: Primary care physician; Roselee Nova, MD

## 2017-01-18 ENCOUNTER — Ambulatory Visit: Payer: Self-pay | Admitting: *Deleted

## 2017-01-18 DIAGNOSIS — R5383 Other fatigue: Secondary | ICD-10-CM | POA: Diagnosis not present

## 2017-01-18 DIAGNOSIS — N179 Acute kidney failure, unspecified: Secondary | ICD-10-CM | POA: Diagnosis not present

## 2017-01-18 DIAGNOSIS — E1165 Type 2 diabetes mellitus with hyperglycemia: Secondary | ICD-10-CM | POA: Diagnosis not present

## 2017-01-18 DIAGNOSIS — G8929 Other chronic pain: Secondary | ICD-10-CM | POA: Diagnosis not present

## 2017-01-18 DIAGNOSIS — K769 Liver disease, unspecified: Secondary | ICD-10-CM | POA: Diagnosis not present

## 2017-01-18 DIAGNOSIS — Z87891 Personal history of nicotine dependence: Secondary | ICD-10-CM | POA: Diagnosis not present

## 2017-01-18 DIAGNOSIS — R918 Other nonspecific abnormal finding of lung field: Secondary | ICD-10-CM | POA: Diagnosis not present

## 2017-01-18 DIAGNOSIS — Z801 Family history of malignant neoplasm of trachea, bronchus and lung: Secondary | ICD-10-CM | POA: Diagnosis not present

## 2017-01-18 DIAGNOSIS — D508 Other iron deficiency anemias: Secondary | ICD-10-CM | POA: Diagnosis not present

## 2017-01-18 DIAGNOSIS — R05 Cough: Secondary | ICD-10-CM | POA: Diagnosis not present

## 2017-01-18 DIAGNOSIS — I509 Heart failure, unspecified: Secondary | ICD-10-CM | POA: Diagnosis not present

## 2017-01-18 DIAGNOSIS — C7802 Secondary malignant neoplasm of left lung: Secondary | ICD-10-CM | POA: Diagnosis not present

## 2017-01-18 DIAGNOSIS — F329 Major depressive disorder, single episode, unspecified: Secondary | ICD-10-CM | POA: Diagnosis not present

## 2017-01-18 DIAGNOSIS — Z803 Family history of malignant neoplasm of breast: Secondary | ICD-10-CM | POA: Diagnosis not present

## 2017-01-18 DIAGNOSIS — R0602 Shortness of breath: Secondary | ICD-10-CM | POA: Diagnosis not present

## 2017-01-18 DIAGNOSIS — R1084 Generalized abdominal pain: Secondary | ICD-10-CM | POA: Diagnosis not present

## 2017-01-18 DIAGNOSIS — B370 Candidal stomatitis: Secondary | ICD-10-CM | POA: Diagnosis not present

## 2017-01-18 DIAGNOSIS — R109 Unspecified abdominal pain: Secondary | ICD-10-CM | POA: Diagnosis not present

## 2017-01-18 DIAGNOSIS — Z9049 Acquired absence of other specified parts of digestive tract: Secondary | ICD-10-CM | POA: Diagnosis not present

## 2017-01-18 DIAGNOSIS — M6281 Muscle weakness (generalized): Secondary | ICD-10-CM | POA: Diagnosis not present

## 2017-01-18 DIAGNOSIS — Z79899 Other long term (current) drug therapy: Secondary | ICD-10-CM | POA: Diagnosis not present

## 2017-01-18 DIAGNOSIS — M1990 Unspecified osteoarthritis, unspecified site: Secondary | ICD-10-CM | POA: Diagnosis not present

## 2017-01-18 DIAGNOSIS — R1011 Right upper quadrant pain: Secondary | ICD-10-CM | POA: Diagnosis not present

## 2017-01-18 DIAGNOSIS — R531 Weakness: Secondary | ICD-10-CM | POA: Diagnosis not present

## 2017-01-18 DIAGNOSIS — R008 Other abnormalities of heart beat: Secondary | ICD-10-CM | POA: Diagnosis not present

## 2017-01-18 DIAGNOSIS — C3490 Malignant neoplasm of unspecified part of unspecified bronchus or lung: Secondary | ICD-10-CM | POA: Diagnosis not present

## 2017-01-18 DIAGNOSIS — R1310 Dysphagia, unspecified: Secondary | ICD-10-CM | POA: Diagnosis not present

## 2017-01-18 DIAGNOSIS — I10 Essential (primary) hypertension: Secondary | ICD-10-CM | POA: Diagnosis not present

## 2017-01-18 DIAGNOSIS — R110 Nausea: Secondary | ICD-10-CM | POA: Diagnosis not present

## 2017-01-18 DIAGNOSIS — F320 Major depressive disorder, single episode, mild: Secondary | ICD-10-CM | POA: Diagnosis not present

## 2017-01-18 DIAGNOSIS — C7800 Secondary malignant neoplasm of unspecified lung: Secondary | ICD-10-CM | POA: Diagnosis not present

## 2017-01-18 DIAGNOSIS — E119 Type 2 diabetes mellitus without complications: Secondary | ICD-10-CM | POA: Diagnosis not present

## 2017-01-18 DIAGNOSIS — I959 Hypotension, unspecified: Secondary | ICD-10-CM | POA: Diagnosis not present

## 2017-01-18 DIAGNOSIS — E7800 Pure hypercholesterolemia, unspecified: Secondary | ICD-10-CM | POA: Diagnosis not present

## 2017-01-18 DIAGNOSIS — Z7401 Bed confinement status: Secondary | ICD-10-CM | POA: Diagnosis not present

## 2017-01-18 DIAGNOSIS — I110 Hypertensive heart disease with heart failure: Secondary | ICD-10-CM | POA: Diagnosis not present

## 2017-01-18 DIAGNOSIS — I5033 Acute on chronic diastolic (congestive) heart failure: Secondary | ICD-10-CM | POA: Diagnosis not present

## 2017-01-18 DIAGNOSIS — M7989 Other specified soft tissue disorders: Secondary | ICD-10-CM | POA: Diagnosis not present

## 2017-01-18 DIAGNOSIS — M5417 Radiculopathy, lumbosacral region: Secondary | ICD-10-CM | POA: Diagnosis not present

## 2017-01-18 DIAGNOSIS — Z9884 Bariatric surgery status: Secondary | ICD-10-CM | POA: Diagnosis not present

## 2017-01-18 DIAGNOSIS — J189 Pneumonia, unspecified organism: Secondary | ICD-10-CM | POA: Diagnosis not present

## 2017-01-18 DIAGNOSIS — D500 Iron deficiency anemia secondary to blood loss (chronic): Secondary | ICD-10-CM | POA: Diagnosis not present

## 2017-01-18 LAB — CULTURE, BLOOD (ROUTINE X 2)
CULTURE: NO GROWTH
Culture: NO GROWTH
SPECIAL REQUESTS: ADEQUATE

## 2017-01-18 LAB — GLUCOSE, CAPILLARY
GLUCOSE-CAPILLARY: 176 mg/dL — AB (ref 65–99)
Glucose-Capillary: 178 mg/dL — ABNORMAL HIGH (ref 65–99)

## 2017-01-18 NOTE — Discharge Summary (Signed)
[facility] [place] NAME: Mackenzie Park    MR#:  [id number]  DATE OF BIRTH:  01/26/1946  DATE OF ADMISSION:  01/13/2017 ADMITTING PHYSICIAN: Vaughan Basta, MD  DATE OF DISCHARGE: 01/18/2017  PRIMARY CARE PHYSICIAN: Roselee Nova, MD    ADMISSION DIAGNOSIS:  Weakness [R53.1] Community acquired pneumonia of left lower lobe of lung ([place]) [J18.1] Acute on chronic congestive heart failure, unspecified heart failure type ([place]) [I50.9]  DISCHARGE DIAGNOSIS:  Generalized weakness Chronic anemia-iron deficiency Acute on chronic CHF diastolic-improving  SECONDARY DIAGNOSIS:   Past Medical History:  Diagnosis Date  . Allergic rhinitis   . Cancer ([place])    liver  . CHF (congestive heart failure) ([place])   . Depressive disorder   . Diabetes mellitus ([place])   . Hypercholesteremia   . Hypertension   . Lumbosacral neuritis   . Osteoarthritis   . Tumor liver     HOSPITAL COURSE:   Mackenzie Park a 71 y.o.femalewith a known history of Metastatic cancer, CHF, depression, hypertension, hypercholesterolemia, diabetes- was admitted in hospital for bleeding issues and blood loss anemia 3 weeks ago was diagnosed to have metastatic cancer suspected lung cancer. But she continued to get significantly worse in her strength and she need complete care now by her sister and family.  * Pneumonia -received ceftriaxone and azithromycin and follow clinically.---Clinically does not have much signs of infection. We'll de-escalate antibiotics to zithromax Wbc improving No fever  * Bilateral pedal edema With history of diastolic congestive heart failure acute on chronic -Combination of immobility, metastatic cancer, CHF - received IV Lasix----appears euvolemic edema much better--- changed to oral Lasix -Patient requires 2 L of nasal cannula oxygen to keep sats greater than 92%  * Metastatic cancerSuspected lung cancer given lung mass   -Patient sees Dr. Yevette Edwards at the [facility] and according to his notes patient has decided not to pursue any aggressive workup including biopsy.  * Anemia due to blood loss and cancer Currently stable with oral iron therapy, continue monitoring. -Hemoglobin much improved with IV and oral iron -hgb is 9.5  * Generalized weakness and severe deconditioning. Patient during her admission in April 2018 was recommended to go to rehabilitation where she declined and went home with family. She is been overall very weak and now is agreeable for rehabilitation.  Physical therapy recommends rehabilitation Social worker for discharge planning. Patient has approval from her insurance she'll go to [facility] today   Pt is at baseline. D/c to rehab today CONSULTS OBTAINED:    DRUG ALLERGIES:   Allergies  Allergen Reactions  . Sulfa Antibiotics Anaphylaxis and Hives  . Other Nausea And Vomiting    Milk products    DISCHARGE MEDICATIONS:   Current Discharge Medication List    START taking these medications   Details  azithromycin (ZITHROMAX) 250 MG tablet Take 1 tablet (250 mg total) by mouth daily. Qty: 3 tablet, Refills: 0      CONTINUE these medications which have CHANGED   Details  insulin glargine (LANTUS) 100 UNIT/ML injection Inject 0.25 mLs (25 Units total) into the skin at bedtime. Increase to 25 units after 3 days if sugars not under 200 Qty: 10 mL, Refills: 11      CONTINUE these medications which have NOT CHANGED   Details  ACCU-CHEK AVIVA PLUS test strip     ACCU-CHEK SOFTCLIX LANCETS lancets     Blood Glucose Monitoring Suppl (ACCU-CHEK AVIVA PLUS) w/Device KIT  feeding supplement, GLUCERNA SHAKE, (GLUCERNA SHAKE) LIQD Take 237 mLs by mouth 2 (two) times daily between meals. Qty: 30 Can, Refills: 0    fluticasone (FLONASE) 50 MCG/ACT nasal spray Place 1-2 sprays into both nostrils daily as needed for allergies.     furosemide (LASIX) 20 MG  tablet Take 1 tablet (20 mg total) by mouth daily. Qty: 30 tablet, Refills: 0    gabapentin (NEURONTIN) 300 MG capsule Take 1 capsule (300 mg total) by mouth at bedtime. Qty: 90 capsule, Refills: 0   Associated Diagnoses: Uncontrolled type 2 diabetes mellitus with peripheral neuropathy (HCC)    iron polysaccharides (NIFEREX) 150 MG capsule Take 1 capsule (150 mg total) by mouth daily. Qty: 30 capsule, Refills: 0    meloxicam (MOBIC) 15 MG tablet Take 15 mg by mouth daily.     metoprolol tartrate (LOPRESSOR) 25 MG tablet Take 0.5 tablets (12.5 mg total) by mouth 2 (two) times daily. Qty: 60 tablet, Refills: 0    Multiple Vitamin (MULTIVITAMIN WITH MINERALS) TABS tablet Take 1 tablet by mouth daily. Qty: 30 tablet, Refills: 0    pantoprazole (PROTONIX) 40 MG tablet Take 1 tablet (40 mg total) by mouth 2 (two) times daily before a meal. Qty: 60 tablet, Refills: 1    potassium chloride SA (K-DUR,KLOR-CON) 20 MEQ tablet Take 1 tablet (20 mEq total) by mouth 2 (two) times daily. Qty: 30 tablet, Refills: 0    sitaGLIPtin (JANUVIA) 100 MG tablet Take 1 tablet (100 mg total) by mouth daily. Qty: 270 tablet, Refills: 0   Associated Diagnoses: Uncontrolled type 2 diabetes mellitus with peripheral neuropathy (HCC)    sucralfate (CARAFATE) 1 GM/10ML suspension Take 10 mLs (1 g total) by mouth 4 (four) times daily -  with meals and at bedtime. Qty: 420 mL, Refills: 0        If you experience worsening of your admission symptoms, develop shortness of breath, life threatening emergency, suicidal or homicidal thoughts you must seek medical attention immediately by calling 911 or calling your MD immediately  if symptoms less severe.  You Must read complete instructions/literature along with all the possible adverse reactions/side effects for all the Medicines you take and that have been prescribed to you. Take any new Medicines after you have completely understood and accept all the possible  adverse reactions/side effects.   Please note  You were cared for by a hospitalist during your hospital stay. If you have any questions about your discharge medications or the care you received while you were in the hospital after you are discharged, you can call the unit and asked to speak with the hospitalist on call if the hospitalist that took care of you is not available. Once you are discharged, your primary care physician will handle any further medical issues. Please note that NO REFILLS for any discharge medications will be authorized once you are discharged, as it is imperative that you return to your primary care physician (or establish a relationship with a primary care physician if you do not have one) for your aftercare needs so that they can reassess your need for medications and monitor your lab values. Today   SUBJECTIVE  No new complaints   VITAL SIGNS:  Blood pressure (!) 129/51, pulse 92, temperature 98.4 F (36.9 C), temperature source Oral, resp. rate 20, height 5' 6"  (1.676 m), weight 101.2 kg (223 lb), SpO2 96 %.  I/O:    Intake/Output Summary (Last 24 hours) at 01/18/17 1012 Last data filed at  01/18/17 0800  Gross per 24 hour  Intake                0 ml  Output                0 ml  Net                0 ml    PHYSICAL EXAMINATION:  GENERAL:  71 y.o.-year-old patient lying in the bed with no acute distress.  EYES: Pupils equal, round, reactive to light and accommodation. No scleral icterus. Extraocular muscles intact.  HEENT: Head atraumatic, normocephalic. Oropharynx and nasopharynx clear.  NECK:  Supple, no jugular venous distention. No thyroid enlargement, no tenderness.  LUNGS: Normal breath sounds bilaterally, no wheezing, rales,rhonchi or crepitation. No use of accessory muscles of respiration.  CARDIOVASCULAR: S1, S2 normal. No murmurs, rubs, or gallops.  ABDOMEN: Soft, non-tender, non-distended. Bowel sounds present. No organomegaly or mass.   EXTREMITIES: +pedal edema, cyanosis, or clubbing.  NEUROLOGIC: Cranial nerves II through XII are intact. Muscle strength 5/5 in all extremities. Sensation intact. Gait not checked.  PSYCHIATRIC: The patient is alert and oriented x 3.  SKIN: No obvious rash, lesion, or ulcer.   DATA REVIEW:   CBC   Recent Labs Lab 01/17/17 0713  WBC 13.2*  HGB 9.0*  HCT 28.3*  PLT 195    Chemistries   Recent Labs Lab 01/14/17 0442 01/17/17 0713  NA 136  --   K 4.0  --   CL 92*  --   CO2 32  --   GLUCOSE 319*  --   BUN 23*  --   CREATININE 0.84 0.72  CALCIUM 8.8*  --     Microbiology Results   Recent Results (from the past 240 hour(s))  Blood culture (routine x 2)     Status: None   Collection Time: 01/13/17  7:45 PM  Result Value Ref Range Status   Specimen Description BLOOD LEFT ANTECUBITAL  Final   Special Requests   Final    BOTTLES DRAWN AEROBIC AND ANAEROBIC Blood Culture adequate volume   Culture NO GROWTH 5 DAYS  Final   Report Status 01/18/2017 FINAL  Final  Blood culture (routine x 2)     Status: None   Collection Time: 01/13/17  7:52 PM  Result Value Ref Range Status   Specimen Description BLOOD RIGHT ARM  Final   Special Requests   Final    BOTTLES DRAWN AEROBIC AND ANAEROBIC Blood Culture results may not be optimal due to an excessive volume of blood received in culture bottles   Culture NO GROWTH 5 DAYS  Final   Report Status 01/18/2017 FINAL  Final    RADIOLOGY:  No results found.   Management plans discussed with the patient, family and they are in agreement.  CODE STATUS:     Code Status Orders        Start     Ordered   01/13/17 2253  Do not attempt resuscitation (DNR)  Continuous    Question Answer Comment  In the event of cardiac or respiratory ARREST Do not call a "code blue"   In the event of cardiac or respiratory ARREST Do not perform Intubation, CPR, defibrillation or ACLS   In the event of cardiac or respiratory ARREST Use medication  by any route, position, wound care, and other measures to relive pain and suffering. May use oxygen, suction and manual treatment of airway obstruction as needed for comfort.  01/13/17 2253    Code Status History    Date Active Date Inactive Code Status Order ID Comments User Context   12/20/2016  9:29 PM 12/23/2016  5:13 PM DNR [id number]  Loletha Grayer, MD ED   12/12/2016  3:57 AM 12/19/2016  8:04 PM Full Code [id number]  Ivor Costa, MD ED   05/11/2015  2:13 PM 05/12/2015  4:10 PM Full Code [id number]  Bettey Costa, MD Inpatient    Advance Directive Documentation     Most Recent Value  Type of Advance Directive  Healthcare Power of Attorney  Pre-existing out of facility DNR order (yellow form or pink MOST form)  -  "MOST" Form in Place?  -      TOTAL TIME TAKING CARE OF THIS PATIENT: *40* minutes.    Taneesha Edgin M.D on 01/18/2017 at 10:12 AM  Between 7am to 6pm - Pager - [phone] After 6pm go to [email or web address] - password [email or web address] [facility]  Office  [phone]  CC: Primary care physician; Roselee Nova, MD

## 2017-01-18 NOTE — Clinical Social Work Placement (Signed)
   CLINICAL SOCIAL WORK PLACEMENT  NOTE  Date:  01/18/2017  Patient Details  Name: Mackenzie Park MRN: [id number] Date of Birth: 06-22-1946  Clinical Social Work is seeking post-discharge placement for this patient at the [place] level of care (*CSW will initial, date and re-position this form in  chart as items are completed):  Yes   Patient/family provided with [facility] Work Department's list of facilities offering this level of care within the geographic area requested by the patient (or if unable, by the patient's family).  Yes   Patient/family informed of their freedom to choose among providers that offer the needed level of care, that participate in Medicare, Medicaid or managed care program needed by the patient, have an available bed and are willing to accept the patient.  Yes   Patient/family informed of [facility]'s ownership interest in [facility] and [facility], as well as of the fact that they are under no obligation to receive care at these facilities.  PASRR submitted to EDS on       PASRR number received on       Existing PASRR number confirmed on 01/14/17     FL2 transmitted to all facilities in geographic area requested by pt/family on 01/14/17     FL2 transmitted to all facilities within larger geographic area on       Patient informed that his/her managed care company has contracts with or will negotiate with certain facilities, including the following:        Yes   Patient/family informed of bed offers received.  Patient chooses bed at [facility]     Physician recommends and patient chooses bed at      Patient to be transferred to [facility] on 01/18/17.  Patient to be transferred to facility by [facility]     Patient family notified on 01/18/17 of transfer.  Name of family member notified:  Pt will notify her family     PHYSICIAN       Additional Comment:     _______________________________________________ Darden Dates, LCSW 01/18/2017, 10:56 AM

## 2017-01-18 NOTE — Progress Notes (Signed)
Report called to [facility] and EMS contacted for transport.  Clarise Cruz, RN

## 2017-01-18 NOTE — Clinical Social Work Note (Signed)
Pt is ready for discharge today and will go to [facility]. Facility has received discharge information and is ready to admit pt. Pt is aware and agreeable to discharge plan. Pt will notify her family for discharge plan. [employer] Josem Kaufmann has been obtained. RN will call report. [facility] will provide transportation. CSW is signing off as no further needs identified.   Darden Dates, MSW, LCSW  Clinical Social Worker  [phone]

## 2017-01-19 ENCOUNTER — Inpatient Hospital Stay: Payer: Medicare HMO

## 2017-01-19 VITALS — BP 110/81 | HR 70 | Temp 96.9°F | Resp 18

## 2017-01-19 DIAGNOSIS — K769 Liver disease, unspecified: Secondary | ICD-10-CM | POA: Diagnosis not present

## 2017-01-19 DIAGNOSIS — D62 Acute posthemorrhagic anemia: Secondary | ICD-10-CM

## 2017-01-19 DIAGNOSIS — Z79899 Other long term (current) drug therapy: Secondary | ICD-10-CM | POA: Diagnosis not present

## 2017-01-19 DIAGNOSIS — D500 Iron deficiency anemia secondary to blood loss (chronic): Secondary | ICD-10-CM | POA: Diagnosis not present

## 2017-01-19 DIAGNOSIS — D508 Other iron deficiency anemias: Secondary | ICD-10-CM | POA: Diagnosis not present

## 2017-01-19 DIAGNOSIS — R110 Nausea: Secondary | ICD-10-CM | POA: Diagnosis not present

## 2017-01-19 DIAGNOSIS — R1084 Generalized abdominal pain: Secondary | ICD-10-CM | POA: Diagnosis not present

## 2017-01-19 DIAGNOSIS — R918 Other nonspecific abnormal finding of lung field: Secondary | ICD-10-CM | POA: Diagnosis not present

## 2017-01-19 DIAGNOSIS — R1011 Right upper quadrant pain: Secondary | ICD-10-CM | POA: Diagnosis not present

## 2017-01-19 DIAGNOSIS — B370 Candidal stomatitis: Secondary | ICD-10-CM | POA: Diagnosis not present

## 2017-01-19 DIAGNOSIS — R5383 Other fatigue: Secondary | ICD-10-CM | POA: Diagnosis not present

## 2017-01-19 DIAGNOSIS — N179 Acute kidney failure, unspecified: Secondary | ICD-10-CM | POA: Diagnosis not present

## 2017-01-19 DIAGNOSIS — I959 Hypotension, unspecified: Secondary | ICD-10-CM | POA: Diagnosis not present

## 2017-01-19 DIAGNOSIS — R531 Weakness: Secondary | ICD-10-CM | POA: Diagnosis not present

## 2017-01-19 LAB — COMPREHENSIVE METABOLIC PANEL
ALBUMIN: 2.9 g/dL — AB (ref 3.5–5.0)
ALT: 75 U/L — ABNORMAL HIGH (ref 14–54)
ANION GAP: 9 (ref 5–15)
AST: 89 U/L — ABNORMAL HIGH (ref 15–41)
Alkaline Phosphatase: 135 U/L — ABNORMAL HIGH (ref 38–126)
BILIRUBIN TOTAL: 1.1 mg/dL (ref 0.3–1.2)
BUN: 29 mg/dL — ABNORMAL HIGH (ref 6–20)
CHLORIDE: 97 mmol/L — AB (ref 101–111)
CO2: 34 mmol/L — ABNORMAL HIGH (ref 22–32)
Calcium: 9.1 mg/dL (ref 8.9–10.3)
Creatinine, Ser: 0.82 mg/dL (ref 0.44–1.00)
GFR calc Af Amer: >60 mL/min (ref >60)
GFR calc non Af Amer: >60 mL/min (ref >60)
Glucose, Bld: 184 mg/dL — ABNORMAL HIGH (ref 65–99)
POTASSIUM: 3.7 mmol/L (ref 3.5–5.1)
Sodium: 140 mmol/L (ref 135–145)
TOTAL PROTEIN: 5.6 g/dL — AB (ref 6.5–8.1)

## 2017-01-19 LAB — CBC WITH DIFFERENTIAL/PLATELET
Basophils Absolute: 0 10*3/uL (ref 0–0.1)
Basophils Relative: 0 %
EOS PCT: 0 %
Eosinophils Absolute: 0 10*3/uL (ref 0–0.7)
HEMATOCRIT: 28.8 % — AB (ref 35.0–47.0)
Hemoglobin: 9.2 g/dL — ABNORMAL LOW (ref 12.0–16.0)
Lymphocytes Relative: 5 %
Lymphs Abs: 0.6 10*3/uL — ABNORMAL LOW (ref 1.0–3.6)
MCH: 29.8 pg (ref 26.0–34.0)
MCHC: 32.1 g/dL (ref 32.0–36.0)
MCV: 92.8 fL (ref 80.0–100.0)
MONO ABS: 0.7 10*3/uL (ref 0.2–0.9)
MONOS PCT: 6 %
NEUTROS ABS: 11.3 10*3/uL — AB (ref 1.4–6.5)
Neutrophils Relative %: 89 %
PLATELETS: 216 10*3/uL (ref 150–440)
RBC: 3.1 MIL/uL — ABNORMAL LOW (ref 3.80–5.20)
RDW: 22.5 % — AB (ref 11.5–14.5)
WBC: 12.7 10*3/uL — ABNORMAL HIGH (ref 3.6–11.0)

## 2017-01-19 MED ORDER — IRON SUCROSE 20 MG/ML IV SOLN: 200.0000 mg | Freq: Once | INTRAVENOUS | Status: DC

## 2017-01-19 MED ORDER — SODIUM CHLORIDE 0.9 % IV SOLN
Freq: Once | INTRAVENOUS | Status: AC
  Administered 2017-01-19: 15:00:00 via INTRAVENOUS
  Filled 2017-01-19: qty 1000

## 2017-01-19 MED ORDER — IRON SUCROSE 20 MG/ML IV SOLN
200.0000 mg | Freq: Once | INTRAVENOUS | Status: AC
  Administered 2017-01-19: 200 mg via INTRAVENOUS
  Filled 2017-01-19: qty 10

## 2017-01-21 NOTE — Progress Notes (Signed)
Cardiology Office Note  Date:  01/24/2017   ID:  Mackenzie Park, DOB 16-Aug-1946, MRN [id number]  PCP:  Roselee Nova, MD   Chief Complaint  Patient presents with  . other    New patient. Patient c/o swelling in feet and ankles. Patient denies chest pain and SOB. HX of Murmur with callwood. Meds reviewed verbally with patient.     HPI:  Mackenzie Park is a pleasant 71 year old woman with history of lung cancer, hypertension, diabetes Recent hospital admission for PNA Diastolic CHF in the setting of anemia Weakness HTN DM II Hyperlipidemia Metastatic cancer, Lung, decided not to pursue any aggressive workup including biopsy. Who presents for new patient visit for her leg edema, diastolic CHF  She presented to the hospital 01/13/2017 with weakness, fluid overload, shortness of breath when supine. She was taking Lasix 20 mg daily on admission, with potassium 20 Noted to be anemic with hemoglobin 9.5 Diagnosed as having pneumonia (based on chest x-ray, not clinically)  and diastolic CHF She had Lasix IV during her hospital course, discharge on May 16 Discharge to [facility] for [facility]  Previous hospitalization for bleeding, blood loss anemia 3 weeks earlier  In follow-up today she reports mild improvement of her leg swelling but still significant Shortness of breath on exertion She did not weigh today on her scale, unable to stand Slow improvement in the leg swelling over the past 2 weeks or so since discharge  Notes indicating she is still on iron pill daily with Lasix 20, potassium  EKG personally reviewed by myself on todays visit Shows normal sinus rhythm with rate 72 bpm no significant ST or T-wave changes   PMH:   has a past medical history of Allergic rhinitis; Cancer [facility]); CHF (congestive heart failure) ([place]); Depressive disorder; Diabetes mellitus ([place]); Hypercholesteremia; Hypertension; Lumbosacral neuritis; Osteoarthritis; and Tumor liver.  PSH:    Past  Surgical History:  Procedure Laterality Date  . 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   . CHOLECYSTECTOMY    . REPLACEMENT TOTAL KNEE BILATERAL    . TUBAL LIGATION      Current Outpatient Prescriptions  Medication Sig Dispense Refill  . ACCU-CHEK AVIVA PLUS test strip     . ACCU-CHEK SOFTCLIX LANCETS lancets     . azithromycin (ZITHROMAX) 250 MG tablet Take 1 tablet (250 mg total) by mouth daily. 3 tablet 0  . Blood Glucose Monitoring Suppl (ACCU-CHEK AVIVA PLUS) w/Device KIT     . feeding supplement, GLUCERNA SHAKE, (GLUCERNA SHAKE) LIQD Take 237 mLs by mouth 2 (two) times daily between meals. 30 Can 0  . fluticasone (FLONASE) 50 MCG/ACT nasal spray Place 1-2 sprays into both nostrils daily as needed for allergies.     . furosemide (LASIX) 20 MG tablet Take 1 tablet (20 mg total) by mouth daily. 30 tablet 0  . gabapentin (NEURONTIN) 300 MG capsule Take 1 capsule (300 mg total) by mouth at bedtime. 90 capsule 0  . insulin glargine (LANTUS) 100 UNIT/ML injection Inject 0.25 mLs (25 Units total) into the skin at bedtime. Increase to 25 units after 3 days if sugars not under 200 10 mL 11  . iron polysaccharides (NIFEREX) 150 MG capsule Take 1 capsule (150 mg total) by mouth daily. 30 capsule 0  . meloxicam (MOBIC) 15 MG tablet Take 15 mg by mouth daily.     . metoprolol tartrate (LOPRESSOR) 25 MG tablet Take 0.5 tablets (12.5 mg total) by mouth  2 (two) times daily. 60 tablet 0  . Multiple Vitamin (MULTIVITAMIN WITH MINERALS) TABS tablet Take 1 tablet by mouth daily. 30 tablet 0  . pantoprazole (PROTONIX) 40 MG tablet Take 1 tablet (40 mg total) by mouth 2 (two) times daily before a meal. 60 tablet 1  . potassium chloride SA (K-DUR,KLOR-CON) 20 MEQ tablet Take 1 tablet (20 mEq total) by mouth 2 (two) times daily. 30 tablet 0  . sitaGLIPtin (JANUVIA) 100 MG tablet Take 1 tablet (100 mg total) by mouth daily. 270 tablet 0  . sucralfate (CARAFATE) 1 GM/10ML suspension  Take 10 mLs (1 g total) by mouth 4 (four) times daily -  with meals and at bedtime. 420 mL 0   No current facility-administered medications for this visit.      Allergies:   Sulfa antibiotics and Other   Social History:  The patient  reports that she quit smoking about 21 years ago. Her smoking use included Cigarettes. She has a 15.00 pack-year smoking history. She has never used smokeless tobacco. She reports that she does not drink alcohol or use drugs.   Family History:   family history includes Breast cancer in her mother and sister; COPD in her sister; Dementia in her father; Heart failure in her brother; Kidney failure in her brother; Lung cancer in her mother and sister.    Review of Systems: Review of Systems  Constitutional: Positive for malaise/fatigue.  Respiratory: Positive for shortness of breath.   Cardiovascular: Positive for leg swelling.  Gastrointestinal: Negative.   Musculoskeletal: Negative.   Neurological: Positive for weakness.  Psychiatric/Behavioral: Negative.   All other systems reviewed and are negative.    PHYSICAL EXAM: VS:  BP (!) 140/50 (BP Location: Left Arm, Patient Position: Sitting, Cuff Size: Normal)   Pulse 72   Ht 5' 6"  (1.676 m)  , BMI There is no height or weight on file to calculate BMI. GEN: Well nourished, well developed, in no acute distress , obese, presenting a wheelchair HEENT: normal  Neck: no JVD, carotid bruits, or masses Cardiac: RRR; no murmurs, rubs, or gallops,1 - 2 + lower extremity bilateral pitting edema  Respiratory:  clear to auscultation bilaterally, scant crackles at the bases, normal work of breathing GI: soft, nontender, nondistended, + BS MS: no deformity or atrophy  Skin: warm and dry, no rash Neuro:  Strength and sensation are intact Psych: euthymic mood, full affect    Recent Labs: 12/15/2016: Magnesium 2.1 01/13/2017: B Natriuretic Peptide 163.0 01/19/2017: ALT 75; BUN 29; Creatinine, Ser 0.82; Hemoglobin  9.2; Platelets 216; Potassium 3.7; Sodium 140    Lipid Panel Lab Results  Component Value Date   CHOL 138 11/16/2016   HDL 53 11/16/2016   LDLCALC 75 11/16/2016   TRIG 52 11/16/2016      Wt Readings from Last 3 Encounters:  01/13/17 223 lb (101.2 kg)  01/12/17 223 lb (101.2 kg)  12/29/16 228 lb (103.4 kg)       ASSESSMENT AND PLAN:  Acute on chronic diastolic CHF (congestive heart failure) (HCC) - Plan: EKG 12-Lead Normal ejection fraction on recent echocardiogram  Would recommend she take Lasix 20 mg twice a day at 8 AM and 2 PM Would take with potassium 20 mEq twice a day She was taking Lasix 20 prior to recent hospital admission, dose needs to be increased  Essential hypertension - Plan: EKG 12-Lead Blood pressure is well controlled on today's visit. No changes made to the medi Likely responsible for some of  her diastolic CHF symptoms  Iron deficiency anemia due to chronic blood loss - Plan: EKG 12-Lead  would recommend she continue 1 if not 2 pills per day  Weakness - Plan: EKG 12-Lead  likely secondary to various medical issues detailed above   Uncontrolled type 2 diabetes mellitus with hyperglycemia, unspecified whether long term insulin use ([place]) - Plan: EKG 12-Lead not eating well, losing weight Low albumin likely contribute into third spacing  Malignant neoplasm of lung, unspecified laterality, unspecified part of lung ([place]) - Plan: EKG 12-Lead Notes indicating she does not want workup  Lower extremity swelling Likely combination of diastolic CHF, anemia, poor nutrition, dependent edema we'll increase Lasix as above     Total encounter time more than 45 minutes  Greater than 50% was spent in counseling and coordination of care with the patient  Disposition:   F/U  3 months   Orders Placed This Encounter  Procedures  . EKG 12-Lead     Signed, Esmond Plants, M.D., Ph.D. 01/24/2017  [place], [place]

## 2017-01-23 ENCOUNTER — Other Ambulatory Visit: Payer: Self-pay | Admitting: *Deleted

## 2017-01-23 NOTE — Patient Outreach (Signed)
[facility]) Care Management  [facility] Social Work  01/23/2017  Mackenzie Park [birth year] [id number]  Subjective: Patient is a 68 year olf female currently in rehab at [facility].  Patient states that she is still weak but improving slowly. Per Patient her sIster Remo Lipps is her main support and will be assisting her post discharge home Per patient she is thinking about discharing with Hospice, however plan is not definite. Per patient, she will need a wheelchair when she returns home, reports being in  no pain. Per patient, she cannot walk and is working on strengthening her legs while in rehab.  Objective:   Encounter Medications:  Outpatient Encounter Prescriptions as of 01/23/2017  Medication Sig Note  . ACCU-CHEK AVIVA PLUS test strip    . ACCU-CHEK SOFTCLIX LANCETS lancets    . azithromycin (ZITHROMAX) 250 MG tablet Take 1 tablet (250 mg total) by mouth daily.   . Blood Glucose Monitoring Suppl (ACCU-CHEK AVIVA PLUS) w/Device KIT    . feeding supplement, GLUCERNA SHAKE, (GLUCERNA SHAKE) LIQD Take 237 mLs by mouth 2 (two) times daily between meals. 12/26/2016: Niece to get.   . fluticasone (FLONASE) 50 MCG/ACT nasal spray Place 1-2 sprays into both nostrils daily as needed for allergies.    . furosemide (LASIX) 20 MG tablet Take 1 tablet (20 mg total) by mouth daily.   Marland Kitchen gabapentin (NEURONTIN) 300 MG capsule Take 1 capsule (300 mg total) by mouth at bedtime.   . insulin glargine (LANTUS) 100 UNIT/ML injection Inject 0.25 mLs (25 Units total) into the skin at bedtime. Increase to 25 units after 3 days if sugars not under 200   . iron polysaccharides (NIFEREX) 150 MG capsule Take 1 capsule (150 mg total) by mouth daily.   . meloxicam (MOBIC) 15 MG tablet Take 15 mg by mouth daily.    . metoprolol tartrate (LOPRESSOR) 25 MG tablet Take 0.5 tablets (12.5 mg total) by mouth 2 (two) times daily.   . Multiple Vitamin (MULTIVITAMIN WITH MINERALS) TABS tablet Take 1 tablet  by mouth daily.   . pantoprazole (PROTONIX) 40 MG tablet Take 1 tablet (40 mg total) by mouth 2 (two) times daily before a meal.   . potassium chloride SA (K-DUR,KLOR-CON) 20 MEQ tablet Take 1 tablet (20 mEq total) by mouth 2 (two) times daily.   . sitaGLIPtin (JANUVIA) 100 MG tablet Take 1 tablet (100 mg total) by mouth daily.   . sucralfate (CARAFATE) 1 GM/10ML suspension Take 10 mLs (1 g total) by mouth 4 (four) times daily -  with meals and at bedtime.    No facility-administered encounter medications on file as of 01/23/2017.     Functional Status:  In your present state of health, do you have any difficulty performing the following activities: 01/14/2017 01/13/2017  Hearing? N N  Vision? Y Y  Difficulty concentrating or making decisions? N N  Walking or climbing stairs? Y Y  Dressing or bathing? Y Y  Doing errands, shopping? Y Y  Preparing Food and eating ? - -  Using the Toilet? - -  In the past six months, have you accidently leaked urine? - -  Do you have problems with loss of bowel control? - -  Managing your Medications? - -  Managing your Finances? - -  Housekeeping or managing your Housekeeping? - -  Some recent data might be hidden    Fall/Depression Screening:  PHQ 2/9 Scores 01/13/2017 01/04/2017 12/29/2016 12/26/2016 12/26/2016 12/26/2016 11/08/2016  PHQ -  2 Score 0 0 0 0 0 0 0    Assessment: Patient friendly during visit, however reported being tired following her PT. Patient very drowsy would nod off during visit. Patient reports hopes to return home. Patient's sister will be there to support and assist. Home with Hospice is a possibility,  has been discussed but final decision could not be confirmed as discharge planner [place] ext [phone] was not available at the time of this social worker's visit.    Plan:   This social worker to continue to follow patient's progress while in rehab             This social worker to follow up with discharge planner to confirm discharge  plan   Addendum: Voicemail message left on 01/24/17 with social worker to confirm discharge plan.

## 2017-01-24 ENCOUNTER — Encounter: Payer: Self-pay | Admitting: *Deleted

## 2017-01-24 ENCOUNTER — Ambulatory Visit (INDEPENDENT_AMBULATORY_CARE_PROVIDER_SITE_OTHER): Payer: Medicare HMO | Admitting: Cardiovascular Disease

## 2017-01-24 ENCOUNTER — Encounter: Payer: Self-pay | Admitting: Cardiovascular Disease

## 2017-01-24 VITALS — BP 140/50 | HR 72 | Ht 66.0 in

## 2017-01-24 DIAGNOSIS — R531 Weakness: Secondary | ICD-10-CM

## 2017-01-24 DIAGNOSIS — I5033 Acute on chronic diastolic (congestive) heart failure: Secondary | ICD-10-CM

## 2017-01-24 DIAGNOSIS — J189 Pneumonia, unspecified organism: Secondary | ICD-10-CM

## 2017-01-24 DIAGNOSIS — D500 Iron deficiency anemia secondary to blood loss (chronic): Secondary | ICD-10-CM

## 2017-01-24 DIAGNOSIS — I10 Essential (primary) hypertension: Secondary | ICD-10-CM | POA: Diagnosis not present

## 2017-01-24 DIAGNOSIS — E1165 Type 2 diabetes mellitus with hyperglycemia: Secondary | ICD-10-CM | POA: Diagnosis not present

## 2017-01-24 DIAGNOSIS — C3490 Malignant neoplasm of unspecified part of unspecified bronchus or lung: Secondary | ICD-10-CM

## 2017-01-24 NOTE — Patient Instructions (Signed)
Medication Instructions:   Please increase Lasix up to 20 mg twice a day at 8 AM and 2 PM Take with potassium 20 once twice a day  Labwork:  No new labs needed  Testing/Procedures:  No further testing at this time   I recommend watching educational videos on topics of interest to you at:       [email or web address]  Enter code: [email or web address]    Follow-Up: It was a pleasure seeing you in the office today. Please call us if you have new issues that need to be addressed before your next appt.  [phone]  Your physician wants you to follow-up in: 3 months.  You will receive a reminder letter in the mail two months in advance. If you don't receive a letter, please call our office to schedule the follow-up appointment.  If you need a refill on your cardiac medications before your next appointment, please call your pharmacy.

## 2017-01-25 ENCOUNTER — Other Ambulatory Visit: Payer: Self-pay | Admitting: *Deleted

## 2017-01-25 ENCOUNTER — Ambulatory Visit: Payer: Self-pay | Admitting: *Deleted

## 2017-01-25 NOTE — Patient Outreach (Signed)
[facility]) Care Management  01/25/2017  Mackenzie Park [birth year] [id number]   Phone call from discharge planner [place] from [facility] who reported that according to therapy patient is making slow progress and they are recommending long term care for patient. Per [place] she has tried to schedule a care planning meeting to discuss plans, however patient wanted her family to attend.  She will attempt to schedule a meeting today to discuss recommendations.   Plan: This Education officer, museum will follow up within 2 weeks to confirm long term plan.   [name] [facility] Care Management [phone]

## 2017-01-26 ENCOUNTER — Ambulatory Visit: Payer: Medicare HMO

## 2017-01-26 ENCOUNTER — Other Ambulatory Visit: Payer: Medicare HMO

## 2017-01-27 ENCOUNTER — Inpatient Hospital Stay (HOSPITAL_BASED_OUTPATIENT_CLINIC_OR_DEPARTMENT_OTHER): Payer: Medicare HMO | Admitting: Internal Medicine

## 2017-01-27 ENCOUNTER — Inpatient Hospital Stay: Payer: Medicare HMO

## 2017-01-27 VITALS — BP 111/59 | HR 92 | Temp 96.9°F | Wt 235.1 lb

## 2017-01-27 DIAGNOSIS — R1011 Right upper quadrant pain: Secondary | ICD-10-CM | POA: Diagnosis not present

## 2017-01-27 DIAGNOSIS — M7989 Other specified soft tissue disorders: Secondary | ICD-10-CM

## 2017-01-27 DIAGNOSIS — C787 Secondary malignant neoplasm of liver and intrahepatic bile duct: Secondary | ICD-10-CM

## 2017-01-27 DIAGNOSIS — Z794 Long term (current) use of insulin: Secondary | ICD-10-CM

## 2017-01-27 DIAGNOSIS — N179 Acute kidney failure, unspecified: Secondary | ICD-10-CM | POA: Diagnosis not present

## 2017-01-27 DIAGNOSIS — R918 Other nonspecific abnormal finding of lung field: Secondary | ICD-10-CM

## 2017-01-27 DIAGNOSIS — E7800 Pure hypercholesterolemia, unspecified: Secondary | ICD-10-CM

## 2017-01-27 DIAGNOSIS — R05 Cough: Secondary | ICD-10-CM | POA: Diagnosis not present

## 2017-01-27 DIAGNOSIS — I959 Hypotension, unspecified: Secondary | ICD-10-CM

## 2017-01-27 DIAGNOSIS — D500 Iron deficiency anemia secondary to blood loss (chronic): Secondary | ICD-10-CM

## 2017-01-27 DIAGNOSIS — Z9884 Bariatric surgery status: Secondary | ICD-10-CM

## 2017-01-27 DIAGNOSIS — Z87891 Personal history of nicotine dependence: Secondary | ICD-10-CM

## 2017-01-27 DIAGNOSIS — R5383 Other fatigue: Secondary | ICD-10-CM | POA: Diagnosis not present

## 2017-01-27 DIAGNOSIS — R110 Nausea: Secondary | ICD-10-CM | POA: Diagnosis not present

## 2017-01-27 DIAGNOSIS — F329 Major depressive disorder, single episode, unspecified: Secondary | ICD-10-CM

## 2017-01-27 DIAGNOSIS — R0602 Shortness of breath: Secondary | ICD-10-CM

## 2017-01-27 DIAGNOSIS — M5417 Radiculopathy, lumbosacral region: Secondary | ICD-10-CM

## 2017-01-27 DIAGNOSIS — Z79899 Other long term (current) drug therapy: Secondary | ICD-10-CM | POA: Diagnosis not present

## 2017-01-27 DIAGNOSIS — Z9049 Acquired absence of other specified parts of digestive tract: Secondary | ICD-10-CM

## 2017-01-27 DIAGNOSIS — K769 Liver disease, unspecified: Secondary | ICD-10-CM

## 2017-01-27 DIAGNOSIS — I509 Heart failure, unspecified: Secondary | ICD-10-CM

## 2017-01-27 DIAGNOSIS — Z801 Family history of malignant neoplasm of trachea, bronchus and lung: Secondary | ICD-10-CM

## 2017-01-27 DIAGNOSIS — I110 Hypertensive heart disease with heart failure: Secondary | ICD-10-CM

## 2017-01-27 DIAGNOSIS — E119 Type 2 diabetes mellitus without complications: Secondary | ICD-10-CM

## 2017-01-27 DIAGNOSIS — M1990 Unspecified osteoarthritis, unspecified site: Secondary | ICD-10-CM

## 2017-01-27 DIAGNOSIS — Z803 Family history of malignant neoplasm of breast: Secondary | ICD-10-CM

## 2017-01-27 LAB — CBC WITH DIFFERENTIAL/PLATELET
Basophils Absolute: 0 10*3/uL (ref 0–0.1)
Basophils Relative: 0 %
EOS ABS: 0 10*3/uL (ref 0–0.7)
Eosinophils Relative: 0 %
HCT: 28.9 % — ABNORMAL LOW (ref 35.0–47.0)
HEMOGLOBIN: 9.4 g/dL — AB (ref 12.0–16.0)
LYMPHS ABS: 0.4 10*3/uL — AB (ref 1.0–3.6)
Lymphocytes Relative: 4 %
MCH: 30.3 pg (ref 26.0–34.0)
MCHC: 32.6 g/dL (ref 32.0–36.0)
MCV: 92.9 fL (ref 80.0–100.0)
MONO ABS: 0.5 10*3/uL (ref 0.2–0.9)
MONOS PCT: 4 %
NEUTROS PCT: 92 %
Neutro Abs: 11 10*3/uL — ABNORMAL HIGH (ref 1.4–6.5)
Platelets: 174 10*3/uL (ref 150–440)
RBC: 3.11 MIL/uL — ABNORMAL LOW (ref 3.80–5.20)
RDW: 22 % — ABNORMAL HIGH (ref 11.5–14.5)
WBC: 11.9 10*3/uL — ABNORMAL HIGH (ref 3.6–11.0)

## 2017-01-27 LAB — COMPREHENSIVE METABOLIC PANEL
ALK PHOS: 172 U/L — AB (ref 38–126)
ALT: 95 U/L — ABNORMAL HIGH (ref 14–54)
ANION GAP: 7 (ref 5–15)
AST: 96 U/L — ABNORMAL HIGH (ref 15–41)
Albumin: 2.8 g/dL — ABNORMAL LOW (ref 3.5–5.0)
BILIRUBIN TOTAL: 1 mg/dL (ref 0.3–1.2)
BUN: 47 mg/dL — ABNORMAL HIGH (ref 6–20)
CO2: 31 mmol/L (ref 22–32)
Calcium: 9.3 mg/dL (ref 8.9–10.3)
Chloride: 101 mmol/L (ref 101–111)
Creatinine, Ser: 1.27 mg/dL — ABNORMAL HIGH (ref 0.44–1.00)
GFR calc Af Amer: 49 mL/min — ABNORMAL LOW (ref >60)
GFR calc non Af Amer: 42 mL/min — ABNORMAL LOW (ref >60)
Glucose, Bld: 223 mg/dL — ABNORMAL HIGH (ref 65–99)
Potassium: 3.5 mmol/L (ref 3.5–5.1)
SODIUM: 139 mmol/L (ref 135–145)
TOTAL PROTEIN: 5.6 g/dL — AB (ref 6.5–8.1)

## 2017-01-27 NOTE — Assessment & Plan Note (Deleted)
#   Severe IRON DEFICIENCY ANEMIA-hb ~6; likely Upper GIBleed [poor candidate for EGD- Dr.Anna; Jehovas witness; Declines PRBC transfusion]. Multiple IV iron infusions in the hospital. Today hemoglobin is 9.3/slightly better. Continue IV iron infusion weekly.   # Multiple liver masses/ Right Middle lobe Lung mass-? Small cell vs others [poor candidate for biopsy]. Discussed the patient has most likely malignancy in the liver. Patient not too keen on pursuing biopsy- as she is again a poor candidate for any chemotherapy given her multiple medical problems.  # Low BP- cut donw to once a day lopressor.   # I had a long discussion the patient and family- most of her symptoms/feeling poorly is likely from progressive malignancy. I also discussed at length regarding the incurable nature of the disease; patient was evaluated by palliative care as an outpatient. I reviewed the notes in detail. Recommend hospice evaluation. Patient agreeable; however does not want to go to hospice home at this time. However she understands if her clinical condition worsens - she might have to go to a hospice home.   # [facility]; follow up as needed.Marland Kitchen

## 2017-01-27 NOTE — Progress Notes (Signed)
[facility] OFFICE PROGRESS NOTE  Patient Care Team: Roselee Nova, MD as PCP - General (Family Medicine) Lyman Speller, RN as [facility], Reed Breech, [place] as Consulting Physician (Optometry) Cammie Sickle, MD as Consulting Physician (Internal Medicine) Minna Merritts, MD as Consulting Physician (Cardiology)  Cancer Staging No matching staging information was found for the patient.    No history exists.    # Severe IRON DEFICIENCY ANEMIA-hb ~6; likely Upper GIBleed [ poor candidate for EGD- Dr.Anna; Jehovas witness; Declines PRBC transfusion]  # Multiple liver masses/ Right Middle lobe Lung mass-? Small cell vs others [poor candidate for biopsy]  # CHF diastolic [April 1194, [place]]  INTERVAL HISTORY:  Mackenzie Park 71 y.o.  female  with severe anemia iron deficiency likely GI bleed [poor candidate for endoscopies]; and also with CT scan liver- multiple liver lesions; and also a lesion in the right middle lobe of the lung with atelectasis- highly suspicious for metastases from the lung- again poor candidate for biopsy.  Patient was again admitted to hospital- for  poorly controlled blood sugars and also exacerbation of her congestive heart failure. Patient is currently discharged to a skilled facility.   Patient is unable to participate with physical therapy. She continues to progressively decline. She noted to have worsening swelling in the legs. Overall feeling poorly. Positive for nausea no vomiting. Denies any chest pain. Shortness of breath on exertion. Positive for cough. Abdominal pain right upper quadrant- however currently stable on pain medications.   REVIEW OF SYSTEMS:  A complete 10 point review of system is done which is negative except mentioned above/history of present illness.   PAST MEDICAL HISTORY :  Past Medical History:  Diagnosis Date  . Allergic rhinitis   . Cancer ([place])    liver  . CHF  (congestive heart failure) ([place])   . Depressive disorder   . Diabetes mellitus ([place])   . Hypercholesteremia   . Hypertension   . Lumbosacral neuritis   . Osteoarthritis   . Tumor liver     PAST SURGICAL HISTORY :   Past Surgical History:  Procedure Laterality Date  . 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 at [facility].   . CHOLECYSTECTOMY    . REPLACEMENT TOTAL KNEE BILATERAL    . TUBAL LIGATION      FAMILY HISTORY :   Family History  Problem Relation Age of Onset  . Dementia Father   . COPD Sister   . Breast cancer Sister   . Lung cancer Sister   . Lung cancer Mother   . Breast cancer Mother   . Heart failure Brother   . Kidney failure Brother     SOCIAL HISTORY:   Social History  Substance Use Topics  . Smoking status: Former Smoker    Packs/day: 1.00    Years: 15.00    Types: Cigarettes    Quit date: 08/20/1995  . Smokeless tobacco: Never Used  . Alcohol use No    ALLERGIES:  is allergic to sulfa antibiotics and other.  MEDICATIONS:  Current Outpatient Prescriptions  Medication Sig Dispense Refill  . ACCU-CHEK AVIVA PLUS test strip     . ACCU-CHEK SOFTCLIX LANCETS lancets     . azithromycin (ZITHROMAX) 250 MG tablet Take 1 tablet (250 mg total) by mouth daily. 3 tablet 0  . Blood Glucose Monitoring Suppl (ACCU-CHEK AVIVA PLUS) w/Device KIT     . feeding  supplement, GLUCERNA SHAKE, (GLUCERNA SHAKE) LIQD Take 237 mLs by mouth 2 (two) times daily between meals. 30 Can 0  . fluticasone (FLONASE) 50 MCG/ACT nasal spray Place 1-2 sprays into both nostrils daily as needed for allergies.     . furosemide (LASIX) 20 MG tablet Take 1 tablet (20 mg total) by mouth daily. 30 tablet 0  . gabapentin (NEURONTIN) 300 MG capsule Take 1 capsule (300 mg total) by mouth at bedtime. 90 capsule 0  . insulin glargine (LANTUS) 100 UNIT/ML injection Inject 0.25 mLs (25 Units total) into the skin at bedtime. Increase to 25 units after 3 days if sugars not under 200 10 mL  11  . iron polysaccharides (NIFEREX) 150 MG capsule Take 1 capsule (150 mg total) by mouth daily. 30 capsule 0  . meloxicam (MOBIC) 15 MG tablet Take 15 mg by mouth daily.     . metoprolol tartrate (LOPRESSOR) 25 MG tablet Take 0.5 tablets (12.5 mg total) by mouth 2 (two) times daily. 60 tablet 0  . Multiple Vitamin (MULTIVITAMIN WITH MINERALS) TABS tablet Take 1 tablet by mouth daily. 30 tablet 0  . pantoprazole (PROTONIX) 40 MG tablet Take 1 tablet (40 mg total) by mouth 2 (two) times daily before a meal. 60 tablet 1  . potassium chloride SA (K-DUR,KLOR-CON) 20 MEQ tablet Take 1 tablet (20 mEq total) by mouth 2 (two) times daily. 30 tablet 0  . sitaGLIPtin (JANUVIA) 100 MG tablet Take 1 tablet (100 mg total) by mouth daily. 270 tablet 0  . sucralfate (CARAFATE) 1 GM/10ML suspension Take 10 mLs (1 g total) by mouth 4 (four) times daily -  with meals and at bedtime. 420 mL 0   No current facility-administered medications for this visit.     PHYSICAL EXAMINATION: ECOG PERFORMANCE STATUS: 3 - Symptomatic, >50% confined to bed  BP (!) 111/59 (BP Location: Right Arm, Patient Position: Sitting)   Pulse 92   Temp (!) 96.9 F (36.1 C) (Tympanic)   Wt 235 lb 1.6 oz (106.6 kg)   SpO2 98%   BMI 37.95 kg/m   Filed Weights   01/27/17 0927  Weight: 235 lb 1.6 oz (106.6 kg)    GENERAL: Patient is sick-appearing. Alert, no distress and comfortable.   Accompanied by Caregiver from the nursing home. Patient is a wheelchair. EYES: Positive for pallor.  OROPHARYNX: no thrush or ulceration; dentures. NECK: supple, no masses felt LYMPH:  no palpable lymphadenopathy in the cervical, axillary or inguinal regions LUNGS: clear to auscultation and  No wheeze or crackles HEART/CVS: regular rate & rhythm and no murmurs; 2+ bilateral lower extremity edema ABDOMEN:abdomen soft, non-tender and normal bowel sounds; positive for hepatomegaly. Musculoskeletal:no cyanosis of digits and no clubbing  PSYCH: alert  & oriented x 3 with fluent speech NEURO: no focal motor/sensory deficits SKIN:  no rashes or significant lesions  LABORATORY DATA:  I have reviewed the data as listed    Component Value Date/Time   NA 139 01/27/2017 0902   K 3.5 01/27/2017 0902   CL 101 01/27/2017 0902   CO2 31 01/27/2017 0902   GLUCOSE 223 (H) 01/27/2017 0902   BUN 47 (H) 01/27/2017 0902   CREATININE 1.27 (H) 01/27/2017 0902   CREATININE 0.66 11/16/2016 0955   CALCIUM 9.3 01/27/2017 0902   PROT 5.6 (L) 01/27/2017 0902   ALBUMIN 2.8 (L) 01/27/2017 0902   AST 96 (H) 01/27/2017 0902   ALT 95 (H) 01/27/2017 0902   ALKPHOS 172 (H) 01/27/2017 0902  BILITOT 1.0 01/27/2017 0902   GFRNONAA 42 (L) 01/27/2017 0902   GFRNONAA >89 11/16/2016 0955   GFRAA 49 (L) 01/27/2017 0902   GFRAA >89 11/16/2016 0955    No results found for: SPEP, UPEP  Lab Results  Component Value Date   WBC 11.9 (H) 01/27/2017   NEUTROABS 11.0 (H) 01/27/2017   HGB 9.4 (L) 01/27/2017   HCT 28.9 (L) 01/27/2017   MCV 92.9 01/27/2017   PLT 174 01/27/2017      Chemistry      Component Value Date/Time   NA 139 01/27/2017 0902   K 3.5 01/27/2017 0902   CL 101 01/27/2017 0902   CO2 31 01/27/2017 0902   BUN 47 (H) 01/27/2017 0902   CREATININE 1.27 (H) 01/27/2017 0902   CREATININE 0.66 11/16/2016 0955      Component Value Date/Time   CALCIUM 9.3 01/27/2017 0902   ALKPHOS 172 (H) 01/27/2017 0902   AST 96 (H) 01/27/2017 0902   ALT 95 (H) 01/27/2017 0902   BILITOT 1.0 01/27/2017 0902       RADIOGRAPHIC STUDIES: I have personally reviewed the radiological images as listed and agreed with the findings in the report. No results found.   ASSESSMENT & PLAN:  Liver metastases ([place]) # Multiple liver masses/ Right Middle lobe Lung mass-? Small cell vs others [poor candidate for biopsy]. This has been discussed multiple times for the patient and her family in detail. Patient understands the difficult situation; and decided not to proceed with  any further investigation at this time.  # Given the significant decline in the performance status likely secondary to malignancy- recommend hospice. Patient is not a candidate for systemic therapy given the decline in the performance status/lack of confirmation of malignancy/ severe anemia [C discussion below].  # Severe IRON DEFICIENCY ANEMIA-hb ~6; likely Upper GIBleed [poor candidate for EGD- Dr.Anna; Jehovas witness]. Status post multiple IV iron infusions hemoglobin stable at 9.3. Would not recommend any further IV iron transfusions at this time.  # Acute renal failure creatinine 1.3- likely hepatorenal syndrome/third spacing.    # [facility]; follow up as needed. Discussed with her caregiver. Patient agrees.   No orders of the defined types were placed in this encounter.  All questions were answered. The patient knows to call the clinic with any problems, questions or concerns.      Cammie Sickle, MD 01/28/2017 4:22 PM

## 2017-01-27 NOTE — Progress Notes (Signed)
Patient denies pain or discomfort at this time.  Vitals stable and documented.  Patient ambulates via wheelchair, accompanied by her aide. a

## 2017-01-28 DIAGNOSIS — C787 Secondary malignant neoplasm of liver and intrahepatic bile duct: Secondary | ICD-10-CM | POA: Insufficient documentation

## 2017-01-28 NOTE — Assessment & Plan Note (Signed)
#   Multiple liver masses/ Right Middle lobe Lung mass-? Small cell vs others [poor candidate for biopsy]. This has been discussed multiple times for the patient and her family in detail. Patient understands the difficult situation; and decided not to proceed with any further investigation at this time.  # Given the significant decline in the performance status likely secondary to malignancy- recommend hospice. Patient is not a candidate for systemic therapy given the decline in the performance status/lack of confirmation of malignancy/ severe anemia [C discussion below].  # Severe IRON DEFICIENCY ANEMIA-hb ~6; likely Upper GIBleed [poor candidate for EGD- Dr.Anna; Jehovas witness]. Status post multiple IV iron infusions hemoglobin stable at 9.3. Would not recommend any further IV iron transfusions at this time.  # Acute renal failure creatinine 1.3- likely hepatorenal syndrome/third spacing.    # hosp[facility] eval at [facility]; follow up as needed. Discussed with her caregiver. Patient agrees.

## 2017-01-31 DIAGNOSIS — C7800 Secondary malignant neoplasm of unspecified lung: Secondary | ICD-10-CM | POA: Diagnosis not present

## 2017-01-31 DIAGNOSIS — D508 Other iron deficiency anemias: Secondary | ICD-10-CM | POA: Diagnosis not present

## 2017-01-31 DIAGNOSIS — F320 Major depressive disorder, single episode, mild: Secondary | ICD-10-CM | POA: Diagnosis not present

## 2017-02-02 DIAGNOSIS — G8929 Other chronic pain: Secondary | ICD-10-CM | POA: Diagnosis not present

## 2017-02-02 DIAGNOSIS — C7802 Secondary malignant neoplasm of left lung: Secondary | ICD-10-CM | POA: Diagnosis not present

## 2017-02-07 DIAGNOSIS — C7802 Secondary malignant neoplasm of left lung: Secondary | ICD-10-CM | POA: Diagnosis not present

## 2017-02-07 DIAGNOSIS — R008 Other abnormalities of heart beat: Secondary | ICD-10-CM | POA: Diagnosis not present

## 2017-02-17 ENCOUNTER — Other Ambulatory Visit: Payer: Self-pay | Admitting: *Deleted

## 2017-02-17 ENCOUNTER — Encounter: Payer: Self-pay | Admitting: *Deleted

## 2017-02-17 NOTE — Patient Outreach (Signed)
[facility]) Care Management  02/17/2017  Mackenzie Park [birth year] [id number]   Return call from [facility], discharge planner at [facility] who confirmed that patient did accept Hospice care, however passed away on 16-Feb-2017.    Plan:  This Education officer, museum will inform patient's provider and close case to [facility] care management.    [name] [facility] Care Management [phone]

## 2017-02-17 NOTE — Patient Outreach (Addendum)
[name]) Care Management  02/17/2017  Mackenzie Park 1946-03-03 [id number]   Phone call to discharge planner at [place] to confirm that patient was now [facility].  Voicemail message left requesting a return call.   [name] [facility] Care Management [phone]

## 2017-02-17 NOTE — Patient Outreach (Signed)
This RN CM was informed by Chrystal [facility]N LCSW today  called SNF where pt was staying, inquire about pt receiving Hospice care and was informed pt passed 6/7.    Plan:  RN CM to close case.    Zara Chess.   [facility] Care Management  [phone]

## 2017-03-05 DEATH — deceased

## 2017-03-11 ENCOUNTER — Other Ambulatory Visit: Payer: Self-pay | Admitting: Nurse Practitioner

## 2017-04-26 ENCOUNTER — Ambulatory Visit: Payer: Medicare HMO | Admitting: Cardiovascular Disease

## 2018-11-29 IMAGING — CT CT CHEST W/ CM
3 of 5 series · 14 of 36 positions shown, 17 images · IV contrast (APPLIED)
Comparison: MRI 12/14/2016

CLINICAL DATA: Liver mass on ultrasound and MRI. Elevated liver
enzymes.

EXAM:
CT ABDOMEN AND PELVIS WITH CONTRAST
TECHNIQUE: Multidetector CT imaging of the abdomen and pelvis was performed
using the standard protocol following bolus administration of
intravenous contrast.
CONTRAST:  75mL [id number] IOPAMIDOL ([id number]) INJECTION 61%

[Series 3: cap 5.0 i31f 2 · axial · 0.98mm/px · z∈[-606,-100]mm · 9 of 127 slices shown, 12 images]
[im 13/127  mediastinal]
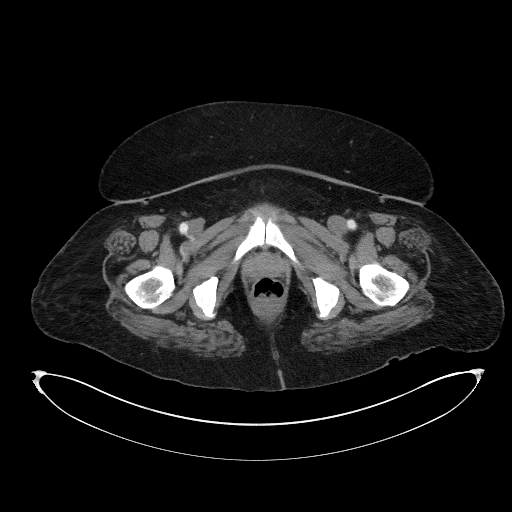
[im 13/127  lung]
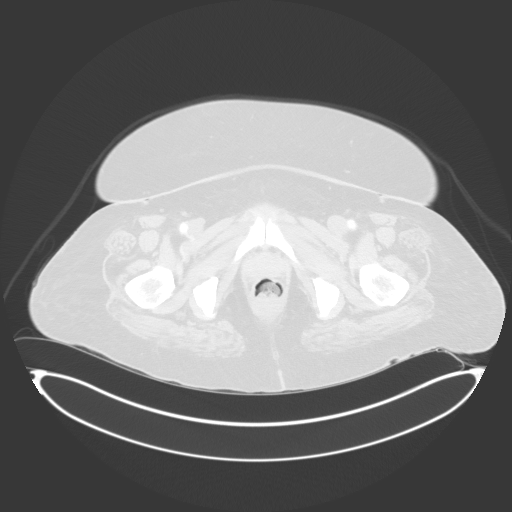
[im 26/127  lung]
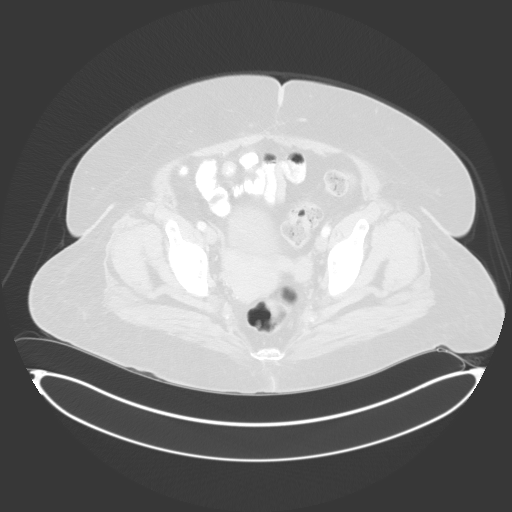
[im 38/127  lung]
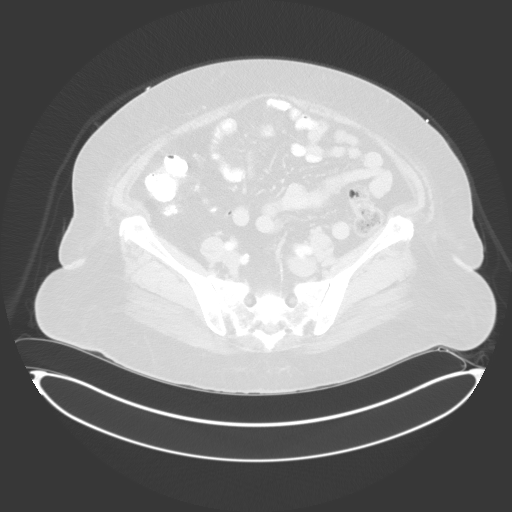
[im 51/127  lung]
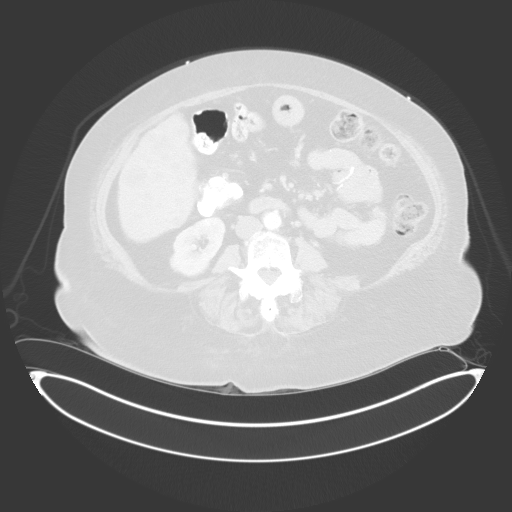
[im 64/127  mediastinal]
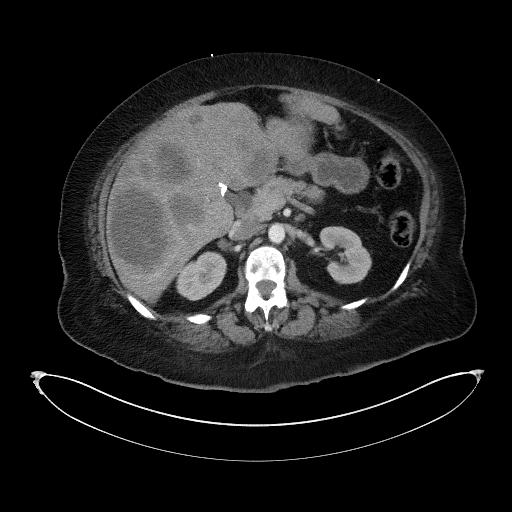
[im 64/127  lung]
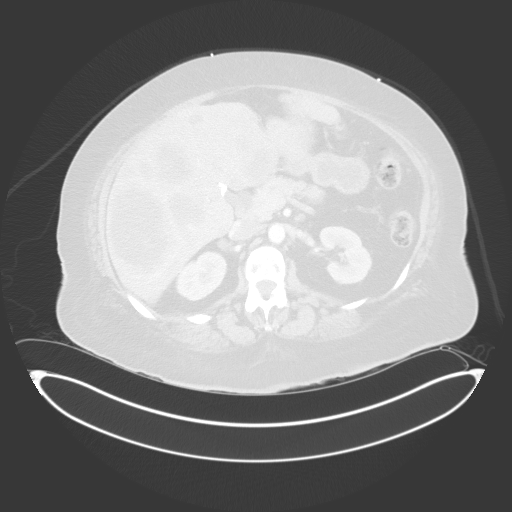
[im 76/127  lung]
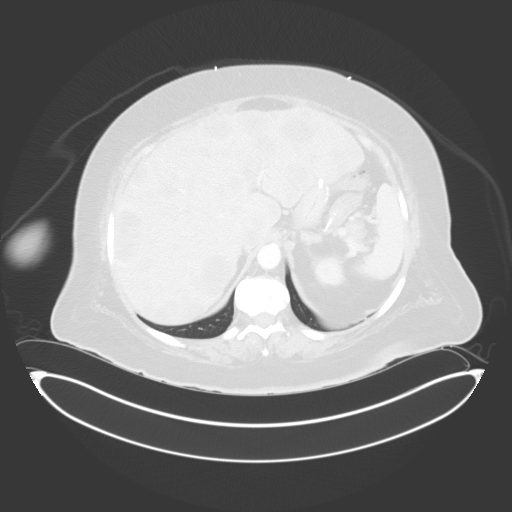
[im 89/127  lung]
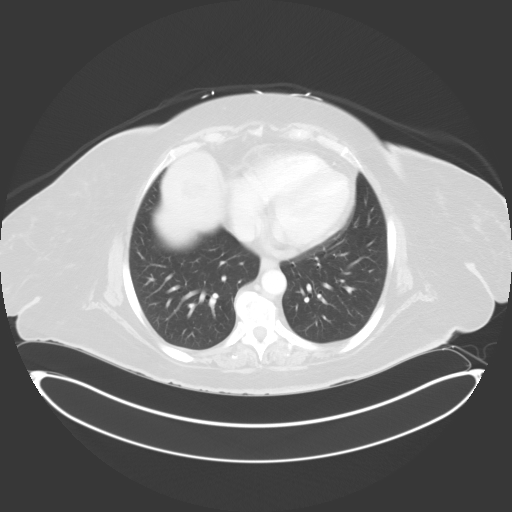
[im 101/127  lung]
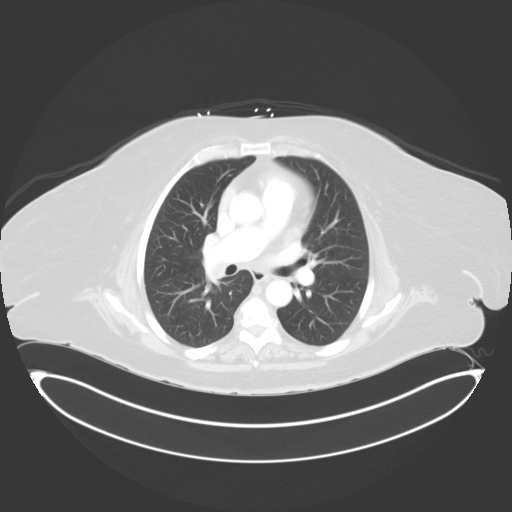
[im 114/127  mediastinal]
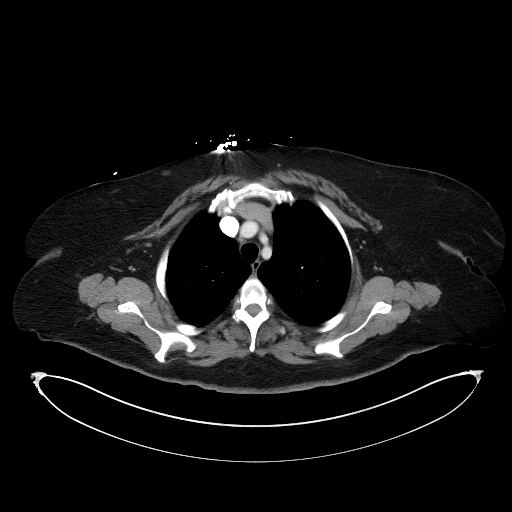
[im 114/127  lung]
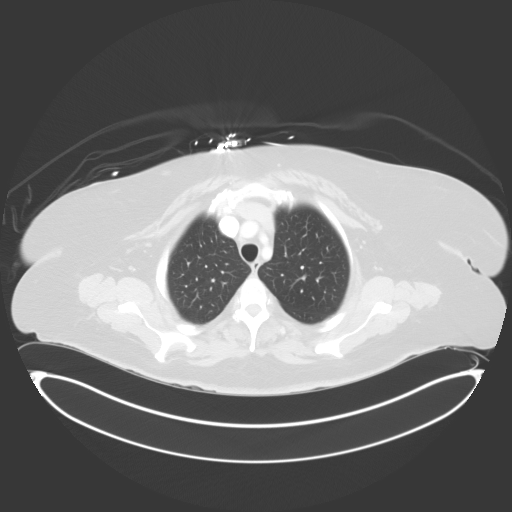

[Series 5: lungs · axial · 0.98mm/px · z∈[-326,-278]mm · 2 of 158 slices shown]
[im 13/158  lung]
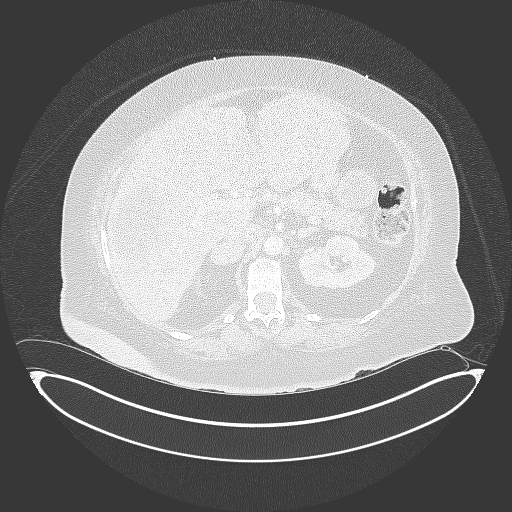
[im 37/158  lung]
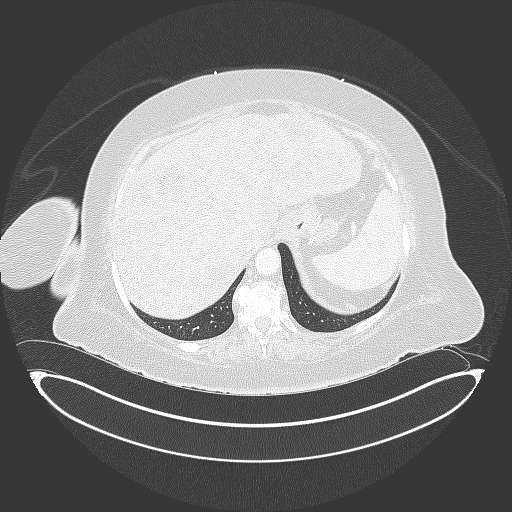

[Series 6: coronal · coronal · 0.87mm/px · 3 of 183 slices shown]
[im 37/183  lung]
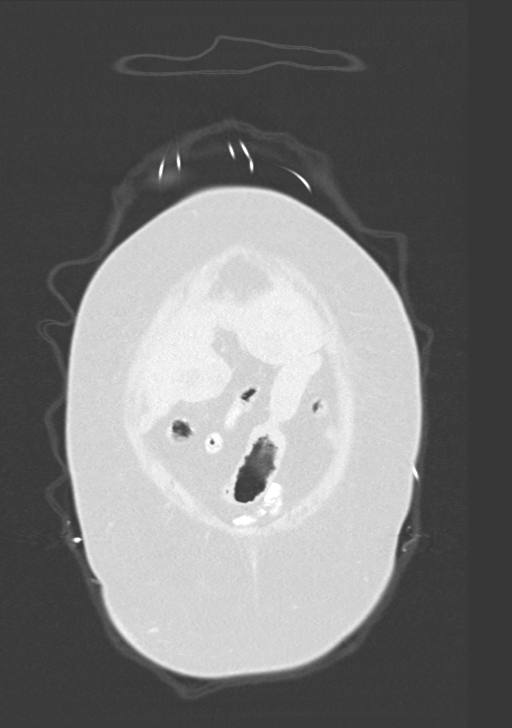
[im 73/183  lung]
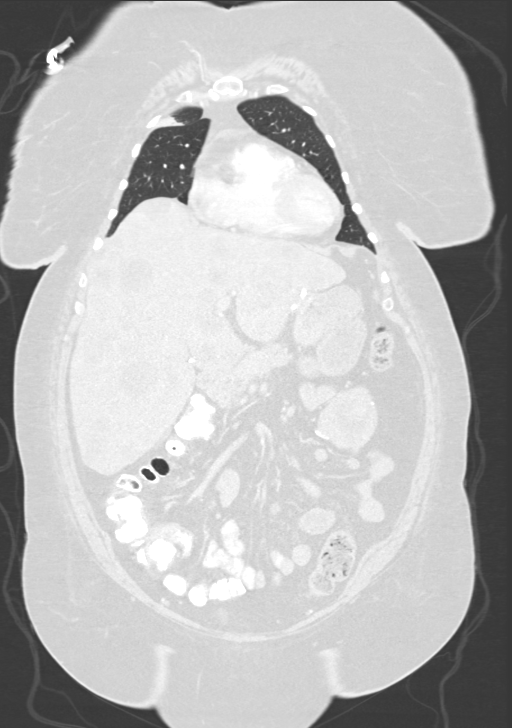
[im 110/183  lung]
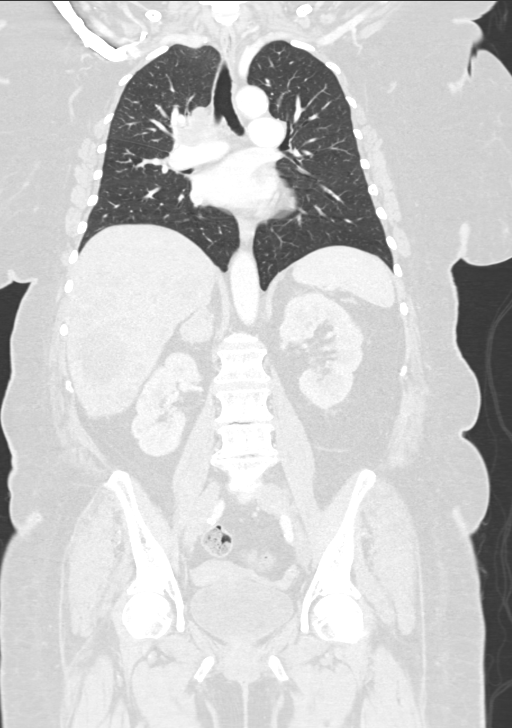

[14 of 36 positions shown; findings below may reference images not displayed]

FINDINGS: CT CHEST FINDINGS

Cardiovascular: No significant vascular findings. Normal heart size.
No pericardial effusion.

Mediastinum/Nodes: No axillary supraclavicular adenopathy. Small 15
mm nodule in the LEFT lobe of thyroid gland.

Enlarged RIGHT lower paratracheal lymph nodes which extend into the
RIGHT hilum. RIGHT lower paratracheal lymph node measures 18 mm
short axis. RIGHT hilar lymph node measures 16 mm.

There is postobstructive atelectasis of the RIGHT middle lobe
associated with this hilar adenopathy/mass.

Lungs/Pleura: RIGHT middle lobe atelectasis. Linear nodular
thickening adjacent to the atelectasis measuring 21 by 19 mm (image
55, series 8). Small peripheral nodule measuring 8 mm on image 57,
series 5.

Musculoskeletal: No aggressive osseous lesion.

CT ABDOMEN AND PELVIS FINDINGS

Hepatobiliary: Multiple low-attenuation lesions with peripheral
enhancement within LEFT and RIGHT hepatic lobes as described on
comparison MRI. Central abdominal lesion measures 53 mm (image 44,
series 3). Large RIGHT hepatic lobe lesion measures 84 mm x 55 mm.
LEFT lateral hepatic lobe lesion measures 27 by 27 mm image 55,
series 3. There are approximately 30 lesions within liver. No
biliary duct dilatation. Postcholecystectomy.

Common bile duct is dilated to 12 mm presumably related to
cholecystectomy.

Pancreas: Pancreas is normal. No ductal dilatation. No pancreatic
inflammation.

Spleen: Normal spleen

Adrenals/urinary tract: RIGHT adrenal gland is enlarged to 22 by 31
mm and enhances. Kidneys ureters normal. Bladder normal.

Stomach/Bowel: Post gastric bypass anatomy. No bowel obstruction.
Appendix normal. Several diverticula of the colon without acute
inflammation.

Vascular/Lymphatic: Abdominal aorta is normal caliber. There is no
retroperitoneal or periportal lymphadenopathy. No pelvic
lymphadenopathy.

Reproductive: Uterus and ovaries normal

Other: No free fluid.

Musculoskeletal: No aggressive osseous lesion.
IMPRESSION: Chest Impression:

1. RIGHT hilar adenopathy / mass with postobstructive collapse of
the RIGHT middle lobe is suggestive of SMALL CELL LUNG CANCER.
2. Nodular lesion in the RIGHT middle lobe adjacent to the
atelectasis may represent primary malignancy.
3. Enlarged RIGHT paratracheal metastatic adenopathy.

Abdomen / Pelvis Impression:

1. Multiple hepatic metastasis as described on comparison MRI.
2. RIGHT adrenal gland metastasis.
3. No metastatic adenopathy in the abdomen pelvis.
4. Postsurgical change consistent with cholecystectomy and bariatric
surgery

## 2018-12-04 IMAGING — CR DG CHEST 2V
1 series · 2 of 2 positions shown · non-contrast
Comparison: Chest radiograph performed 09/13/2016, and CT of the
chest performed 12/15/2016

CLINICAL DATA: Acute onset of hyperglycemia and leukocytosis.
Initial encounter.

EXAM:
CHEST  2 VIEW

[Series 1: w chest pa · 0.14mm/px · 2 of 2 slices shown]
[im 1/2]
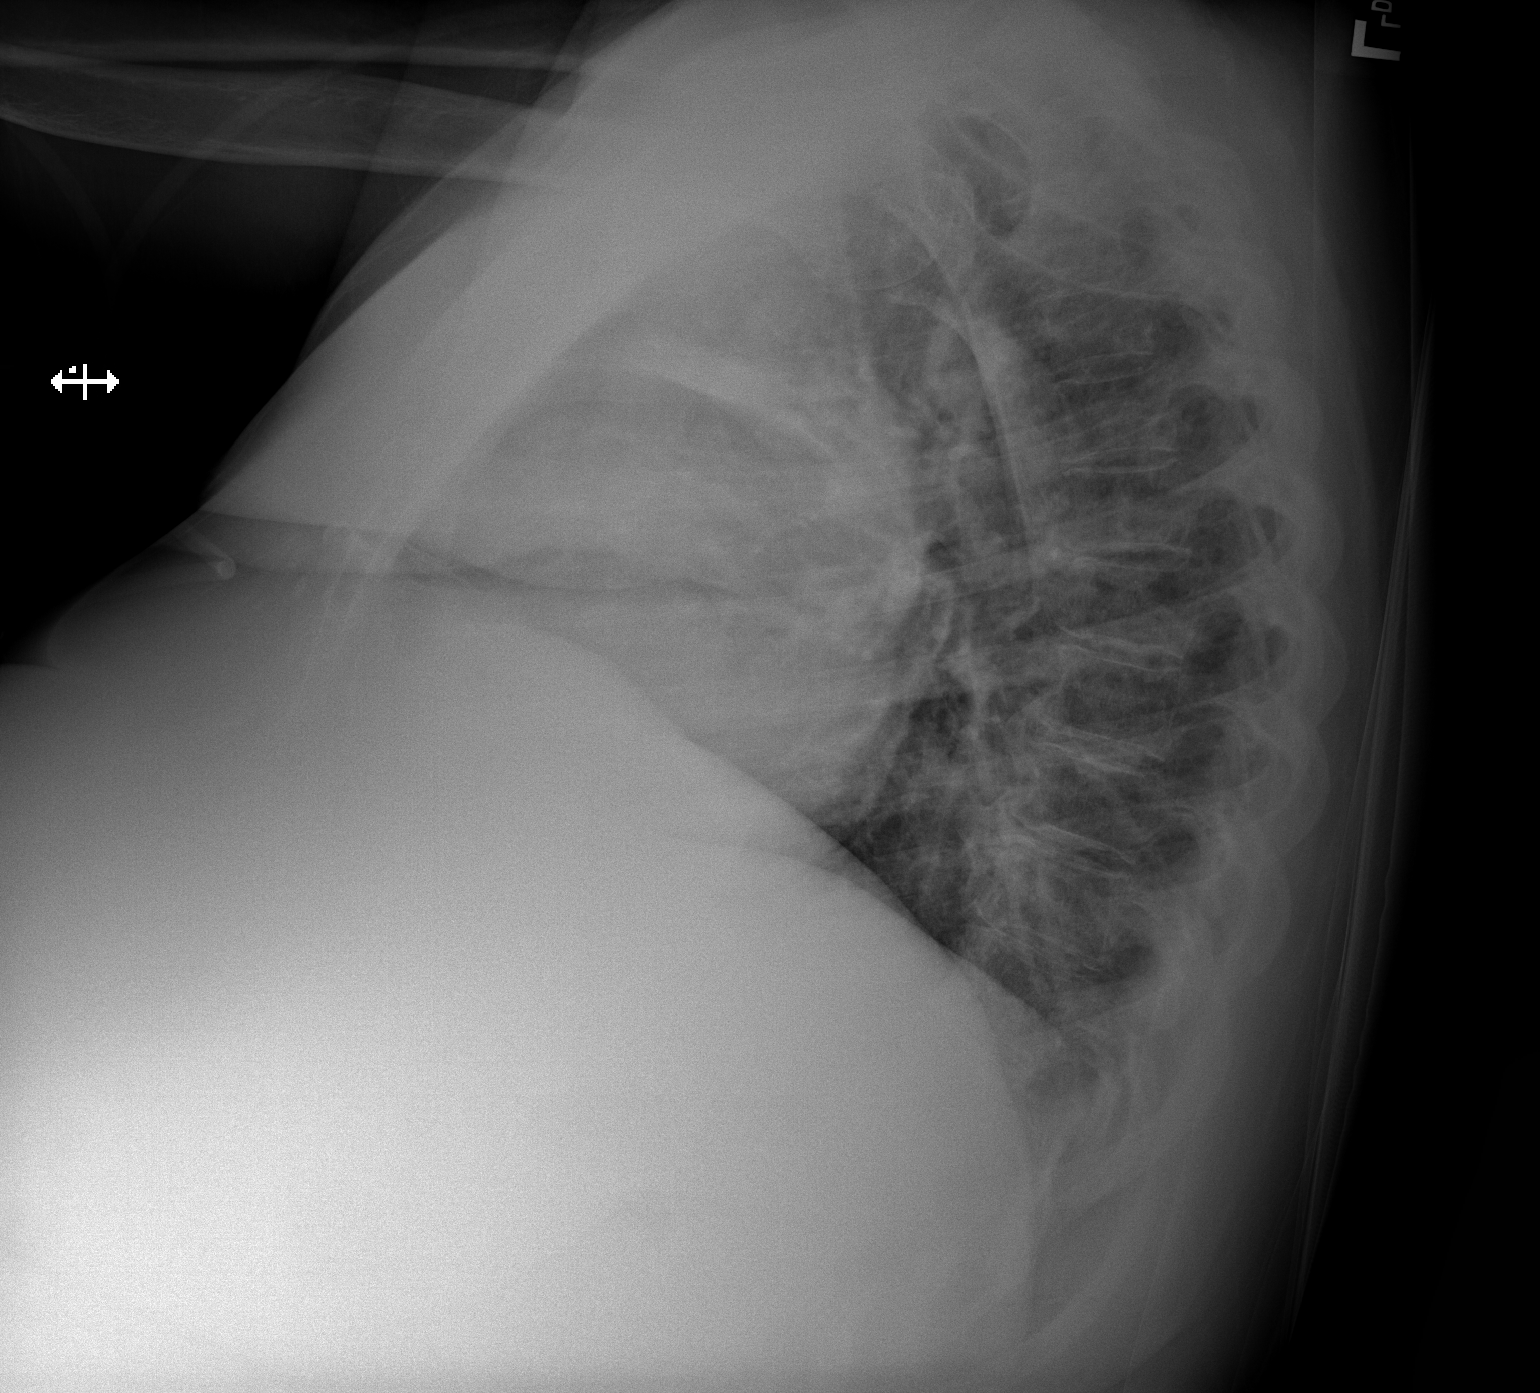
[im 2/2]
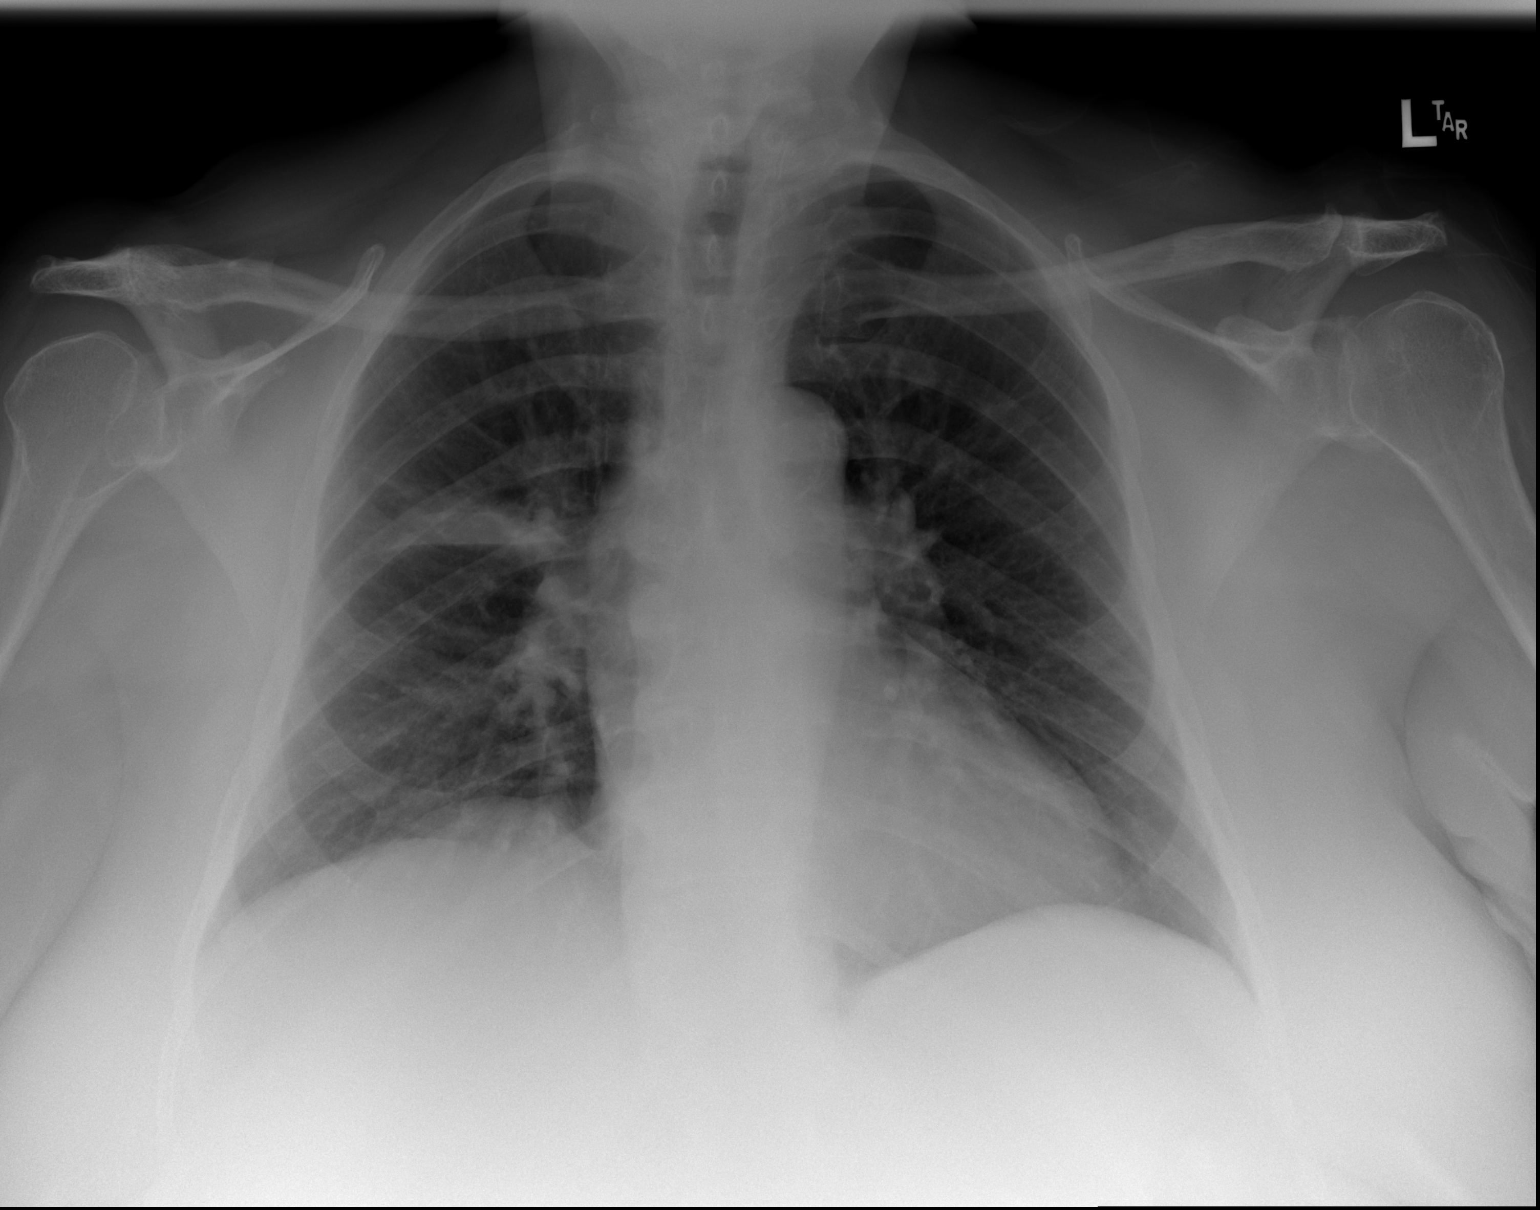

[2 of 2 positions shown; findings below may reference images not displayed]

FINDINGS: The known right hilar mass is not well characterized, with
post-obstructive collapse again noted at the right midlung zone. No
pleural effusion or pneumothorax is seen.

The heart is normal in size. No acute osseous abnormalities are
identified.
IMPRESSION: Known right hilar mass is not well characterized. Post-obstructive
collapse again noted at the right midlung zone. This could reflect
mild pneumonia, depending on the patient's symptoms.
